# Patient Record
Sex: Male | Born: 1960 | Race: White | Hispanic: No | Marital: Single | Smoking: Former smoker
Health system: Southern US, Community
[De-identification: ages and names within clinical notes are randomized; demographics above are authoritative.]

## PROBLEM LIST (undated history)

## (undated) DIAGNOSIS — Z7189 Other specified counseling: Secondary | ICD-10-CM

## (undated) DIAGNOSIS — C3491 Malignant neoplasm of unspecified part of right bronchus or lung: Secondary | ICD-10-CM

## (undated) DIAGNOSIS — Z5111 Encounter for antineoplastic chemotherapy: Secondary | ICD-10-CM

## (undated) DIAGNOSIS — R000 Tachycardia, unspecified: Secondary | ICD-10-CM

## (undated) DIAGNOSIS — J90 Pleural effusion, not elsewhere classified: Secondary | ICD-10-CM

## (undated) DIAGNOSIS — R0600 Dyspnea, unspecified: Secondary | ICD-10-CM

## (undated) DIAGNOSIS — F419 Anxiety disorder, unspecified: Secondary | ICD-10-CM

## (undated) HISTORY — DX: Other specified counseling: Z71.89

## (undated) HISTORY — PX: WISDOM TOOTH EXTRACTION: SHX21

## (undated) HISTORY — DX: Malignant neoplasm of unspecified part of right bronchus or lung: C34.91

## (undated) HISTORY — DX: Encounter for antineoplastic chemotherapy: Z51.11

---

## 2016-01-25 ENCOUNTER — Observation Stay (HOSPITAL_COMMUNITY): Payer: Medicare HMO

## 2016-01-25 ENCOUNTER — Encounter (HOSPITAL_COMMUNITY): Payer: Self-pay

## 2016-01-25 ENCOUNTER — Inpatient Hospital Stay (HOSPITAL_COMMUNITY)
Admission: EM | Admit: 2016-01-25 | Discharge: 2016-02-05 | DRG: 166 | Disposition: A | Discharge status: Home Health | Payer: Medicare HMO | Attending: Internal Medicine | Admitting: Internal Medicine

## 2016-01-25 ENCOUNTER — Emergency Department (HOSPITAL_COMMUNITY): Payer: Medicare HMO

## 2016-01-25 DIAGNOSIS — R7989 Other specified abnormal findings of blood chemistry: Secondary | ICD-10-CM

## 2016-01-25 DIAGNOSIS — J90 Pleural effusion, not elsewhere classified: Secondary | ICD-10-CM

## 2016-01-25 DIAGNOSIS — R0600 Dyspnea, unspecified: Secondary | ICD-10-CM | POA: Diagnosis not present

## 2016-01-25 DIAGNOSIS — K5900 Constipation, unspecified: Secondary | ICD-10-CM | POA: Diagnosis present

## 2016-01-25 DIAGNOSIS — Z87891 Personal history of nicotine dependence: Secondary | ICD-10-CM

## 2016-01-25 DIAGNOSIS — Z9889 Other specified postprocedural states: Secondary | ICD-10-CM

## 2016-01-25 DIAGNOSIS — J910 Malignant pleural effusion: Secondary | ICD-10-CM

## 2016-01-25 DIAGNOSIS — J9811 Atelectasis: Secondary | ICD-10-CM | POA: Diagnosis present

## 2016-01-25 DIAGNOSIS — J939 Pneumothorax, unspecified: Secondary | ICD-10-CM

## 2016-01-25 DIAGNOSIS — Z419 Encounter for procedure for purposes other than remedying health state, unspecified: Secondary | ICD-10-CM

## 2016-01-25 DIAGNOSIS — J9601 Acute respiratory failure with hypoxia: Secondary | ICD-10-CM | POA: Diagnosis present

## 2016-01-25 DIAGNOSIS — R079 Chest pain, unspecified: Secondary | ICD-10-CM

## 2016-01-25 DIAGNOSIS — R0602 Shortness of breath: Secondary | ICD-10-CM | POA: Diagnosis not present

## 2016-01-25 DIAGNOSIS — Z7982 Long term (current) use of aspirin: Secondary | ICD-10-CM

## 2016-01-25 DIAGNOSIS — J948 Other specified pleural conditions: Secondary | ICD-10-CM

## 2016-01-25 DIAGNOSIS — Z9689 Presence of other specified functional implants: Secondary | ICD-10-CM

## 2016-01-25 DIAGNOSIS — N179 Acute kidney failure, unspecified: Secondary | ICD-10-CM | POA: Diagnosis present

## 2016-01-25 DIAGNOSIS — R9431 Abnormal electrocardiogram [ECG] [EKG]: Secondary | ICD-10-CM

## 2016-01-25 DIAGNOSIS — D62 Acute posthemorrhagic anemia: Secondary | ICD-10-CM | POA: Diagnosis not present

## 2016-01-25 DIAGNOSIS — C3491 Malignant neoplasm of unspecified part of right bronchus or lung: Secondary | ICD-10-CM | POA: Diagnosis present

## 2016-01-25 HISTORY — DX: Pleural effusion, not elsewhere classified: J90

## 2016-01-25 HISTORY — PX: THORACENTESIS: SHX235

## 2016-01-25 LAB — CBC
HEMATOCRIT: 40 % (ref 39.0–52.0)
HEMOGLOBIN: 13 g/dL (ref 13.0–17.0)
MCH: 29.7 pg (ref 26.0–34.0)
MCHC: 32.5 g/dL (ref 30.0–36.0)
MCV: 91.5 fL (ref 78.0–100.0)
Platelets: 475 10*3/uL — ABNORMAL HIGH (ref 150–400)
RBC: 4.37 MIL/uL (ref 4.22–5.81)
RDW: 13.7 % (ref 11.5–15.5)
WBC: 14.4 10*3/uL — AB (ref 4.0–10.5)

## 2016-01-25 LAB — COMPREHENSIVE METABOLIC PANEL
ALT: 35 U/L (ref 17–63)
AST: 34 U/L (ref 15–41)
Albumin: 3.1 g/dL — ABNORMAL LOW (ref 3.5–5.0)
Alkaline Phosphatase: 88 U/L (ref 38–126)
Anion gap: 15 (ref 5–15)
BUN: 19 mg/dL (ref 6–20)
CHLORIDE: 94 mmol/L — AB (ref 101–111)
CO2: 26 mmol/L (ref 22–32)
Calcium: 9.4 mg/dL (ref 8.9–10.3)
Creatinine, Ser: 1.32 mg/dL — ABNORMAL HIGH (ref 0.61–1.24)
GFR, EST NON AFRICAN AMERICAN: 59 mL/min — AB (ref >60)
Glucose, Bld: 131 mg/dL — ABNORMAL HIGH (ref 65–99)
POTASSIUM: 4.8 mmol/L (ref 3.5–5.1)
Sodium: 135 mmol/L (ref 135–145)
Total Bilirubin: 0.9 mg/dL (ref 0.3–1.2)
Total Protein: 7.7 g/dL (ref 6.5–8.1)

## 2016-01-25 LAB — PROTEIN, BODY FLUID: TOTAL PROTEIN, FLUID: 4.7 g/dL

## 2016-01-25 LAB — I-STAT TROPONIN, ED: Troponin i, poc: 0 ng/mL (ref 0.00–0.08)

## 2016-01-25 LAB — BODY FLUID CELL COUNT WITH DIFFERENTIAL
EOS FL: 1 %
Lymphs, Fluid: 50 %
Monocyte-Macrophage-Serous Fluid: 3 % — ABNORMAL LOW (ref 50–90)
Neutrophil Count, Fluid: 46 % — ABNORMAL HIGH (ref 0–25)
Total Nucleated Cell Count, Fluid: 1898 cu mm — ABNORMAL HIGH (ref 0–1000)

## 2016-01-25 LAB — LACTATE DEHYDROGENASE, PLEURAL OR PERITONEAL FLUID: LD FL: 674 U/L — AB (ref 3–23)

## 2016-01-25 LAB — PROTIME-INR
INR: 1.52
PROTHROMBIN TIME: 18.5 s — AB (ref 11.4–15.2)

## 2016-01-25 LAB — MRSA PCR SCREENING: MRSA by PCR: NEGATIVE

## 2016-01-25 LAB — GRAM STAIN

## 2016-01-25 LAB — D-DIMER, QUANTITATIVE (NOT AT ARMC): D DIMER QUANT: 4.04 ug{FEU}/mL — AB (ref 0.00–0.50)

## 2016-01-25 LAB — LACTATE DEHYDROGENASE: LDH: 175 U/L (ref 98–192)

## 2016-01-25 LAB — TROPONIN I
Troponin I: <0.03 ng/mL (ref <0.03)
Troponin I: <0.03 ng/mL (ref <0.03)

## 2016-01-25 LAB — LIPASE, BLOOD: LIPASE: 51 U/L (ref 11–51)

## 2016-01-25 MED ORDER — HYDROCODONE-ACETAMINOPHEN 5-325 MG PO TABS
1.0000 | ORAL_TABLET | ORAL | Status: DC | PRN
  Administered 2016-02-03: 2 via ORAL
  Administered 2016-02-04: 1 via ORAL
  Administered 2016-02-04: 2 via ORAL
  Administered 2016-02-04 – 2016-02-05 (×2): 1 via ORAL
  Filled 2016-01-25 (×3): qty 1
  Filled 2016-01-25: qty 2
  Filled 2016-01-25: qty 1
  Filled 2016-01-25: qty 2

## 2016-01-25 MED ORDER — SODIUM CHLORIDE 0.9 % IV SOLN: INTRAVENOUS | Status: DC

## 2016-01-25 MED ORDER — ACETAMINOPHEN 650 MG RE SUPP: 650.0000 mg | Freq: Four times a day (QID) | RECTAL | Status: DC | PRN

## 2016-01-25 MED ORDER — LIDOCAINE HCL 1 % IJ SOLN
INTRAMUSCULAR | Status: AC
  Filled 2016-01-25: qty 20

## 2016-01-25 MED ORDER — BISACODYL 5 MG PO TBEC: 5.0000 mg | DELAYED_RELEASE_TABLET | Freq: Every day | ORAL | Status: DC | PRN

## 2016-01-25 MED ORDER — SODIUM CHLORIDE 0.9 % IV SOLN
INTRAVENOUS | Status: DC
  Administered 2016-01-25 – 2016-01-26 (×2): via INTRAVENOUS

## 2016-01-25 MED ORDER — ACETAMINOPHEN 325 MG PO TABS: 650.0000 mg | ORAL_TABLET | Freq: Four times a day (QID) | ORAL | Status: DC | PRN

## 2016-01-25 MED ORDER — POLYETHYLENE GLYCOL 3350 17 G PO PACK: 17.0000 g | PACK | Freq: Every day | ORAL | Status: DC | PRN

## 2016-01-25 MED ORDER — IOPAMIDOL (ISOVUE-370) INJECTION 76%
INTRAVENOUS | Status: AC
  Administered 2016-01-25: 100 mL
  Filled 2016-01-25: qty 100

## 2016-01-25 MED ORDER — ONDANSETRON HCL 4 MG/2ML IJ SOLN: 4.0000 mg | Freq: Four times a day (QID) | INTRAMUSCULAR | Status: DC | PRN

## 2016-01-25 MED ORDER — ONDANSETRON HCL 4 MG PO TABS: 4.0000 mg | ORAL_TABLET | Freq: Four times a day (QID) | ORAL | Status: DC | PRN

## 2016-01-25 MED ORDER — PIPERACILLIN-TAZOBACTAM 3.375 G IVPB
3.3750 g | Freq: Three times a day (TID) | INTRAVENOUS | Status: DC
  Administered 2016-01-26 – 2016-01-27 (×5): 3.375 g via INTRAVENOUS
  Filled 2016-01-25 (×7): qty 50

## 2016-01-25 MED ORDER — SODIUM CHLORIDE 0.9% FLUSH
3.0000 mL | Freq: Two times a day (BID) | INTRAVENOUS | Status: DC
Start: 2016-01-25 — End: 2016-02-05
  Administered 2016-01-28 – 2016-02-04 (×11): 3 mL via INTRAVENOUS

## 2016-01-25 NOTE — Progress Notes (Signed)
Pharmacy Antibiotic Note  Imanuel Barnes is a 55 y.o. male admitted on 01/25/2016 with shortness of breath. Found to have large right-sided pleural effusion.  Chest CT negative for PE.  Pharmacy has been consulted for Zosyn dosing for parapneumonic effusion.  Afebrile, WBC 14.4, creatinine 1.32.     s/p right thoracentesis, 2 liters.  Fluid sent for culture and analysis.  Plan: Zosyn 3.375g IV q8h (4 hour infusion).  Follow renal function, culture data, studies and progress.  Height: '5\' 10"'$  (177.8 cm) Weight: 182 lb (82.6 kg) IBW/kg (Calculated) : 73  Temp (24hrs), Avg:97.6 F (36.4 C), Min:97.6 F (36.4 C), Max:97.6 F (36.4 C)   Recent Labs Lab 01/25/16 0755  WBC 14.4*  CREATININE 1.32*    Estimated Creatinine Clearance: 64.5 mL/min (by C-G formula based on SCr of 1.32 mg/dL).    No Known Allergies  Antimicrobials this admission:   Zosyn 8/25>>  Dose adjustments this admission:   n/a  Microbiology results:  8/25 pleural fluid -   8/25 MRSA PCR negative  Roger Barnes 01/25/2016 5:19 PM

## 2016-01-25 NOTE — Care Management Note (Signed)
Case Management Note  Patient Details  Name: Marek Nghiem MRN: [id number] Date of Birth: Apr 18, 1961  Subjective/Objective:                  55 yo male presents with dyspnea, tachypnea, tachycardia. Rule out malignancy ( lung,  lymphoma, etc..).Marland Kitchen Rule out PE.// From home with father  Action/Plan: Follow for disposition needs. /Anticipate PCP establishment and medication assistance.   Expected Discharge Date:  01/27/16               Expected Discharge Plan:  Home/Self Care  In-House Referral:  PCP / [facility]  Discharge planning Services  CM Consult, Medication Assistance  Post Acute Care Choice:    Choice offered to:     DME Arranged:    DME Agency:     HH Arranged:    HH Agency:     Status of Service:  In process, will continue to follow  If discussed at Long Length of Stay Meetings, dates discussed:    Additional Comments: Pt reports taking care of his Dad who is a cancer.  Fuller Mandril, RN 01/25/2016, 11:45 AM

## 2016-01-25 NOTE — ED Notes (Signed)
Dr.  Cathleen Fears is aware of pt.s elevated HR

## 2016-01-25 NOTE — Consult Note (Signed)
Name: Roger Barnes MRN: [id number] DOB: 11/12/1960    ADMISSION DATE:  01/25/2016 CONSULTATION DATE:  01/25/16  REFERRING MD :  Allyson Sabal / [facility]   CHIEF COMPLAINT:   Pleural effusion   BRIEF PATIENT DESCRIPTION:  This is a 55 year old male w/ no significant medical history. 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 He presents to the ER on 8/25 w/ about 2 week h/o progressive shortness of breath, intermittent sharp chest pain and scapular pain, poor appetite, possible wt lose. In ER CXR showed complete opacification of the right side w/ shift of mediastinum to the left which would favor effusion. PCCM asked to evaluate.    SIGNIFICANT EVENTS   STUDIES:  CT chest 8/25>>>   PAST MEDICAL HISTORY :  No past medical problems.  Prior to Admission medications   Medication Sig Start Date End Date Taking? Authorizing Provider  aspirin EC 81 MG tablet Take 81 mg by mouth daily.   Yes Historical Provider, MD  Multiple Vitamins-Minerals (MULTIVITAMIN WITH MINERALS) tablet Take 1 tablet by mouth daily.   Yes Historical Provider, MD   No Known Allergies  FAMILY HISTORY:  family history includes COPD in his maternal grandmother; Cancer in his father. His father has extensive head and neck cancer  SOCIAL HISTORY:  reports that he has quit smoking. He has never used smokeless tobacco. He reports that he does not drink alcohol or use drugs.  REVIEW OF SYSTEMS:   Constitutional: Negative for fever, chills, weight loss, malaise/fatigue and diaphoresis.  HENT: Negative for hearing loss, ear pain, nosebleeds, congestion, sore throat, neck pain, tinnitus and ear discharge.   Eyes: Negative for blurred vision, double vision, photophobia, pain, discharge and redness.  Respiratory: Negative for cough, hemoptysis, sputum production, shortness of breath, wheezing and stridor, scapular and back pain Cardiovascular: Negative for chest pain, palpitations, orthopnea,  claudication, leg swelling and PND.  Gastrointestinal: Negative for heartburn, nausea, vomiting, abdominal pain, diarrhea, constipation, blood in stool and melena. poor po appetite  Genitourinary: Negative for dysuria, urgency, frequency, hematuria and flank pain.  Musculoskeletal: Negative for myalgias, back pain, joint pain and falls.  Skin: Negative for itching and rash.  Neurological: Negative for dizziness, tingling, tremors, sensory change, speech change, focal weakness, seizures, loss of consciousness, weakness and headaches.  Endo/Heme/Allergies: Negative for environmental allergies and polydipsia. Does not bruise/bleed easily.  SUBJECTIVE:  No distress  VITAL SIGNS: Temp:  [97.6 F (36.4 C)] 97.6 F (36.4 C) (08/25 0731) Pulse Rate:  [117-127] 117 (08/25 1130) Resp:  [18-44] 26 (08/25 1130) BP: (112-138)/(86-102) 117/96 (08/25 1130) SpO2:  [96 %-100 %] 100 % (08/25 1130) Weight:  [182 lb (82.6 kg)] 182 lb (82.6 kg) (08/25 0732)  PHYSICAL EXAMINATION: General:  55 year old white male, no distress.  Neuro:  Awake, oriented, no focal def  HEENT:  NCAT, MMM, no JVD  Cardiovascular:  RRR; no MRG Lungs:  Decreased on right; clear on left  Abdomen:  Soft, not tender. + bowel sounds  Musculoskeletal:  Equal st and bulk  Skin:  Warm and dry    Recent Labs Lab 01/25/16 0755  NA 135  K 4.8  CL 94*  CO2 26  BUN 19  CREATININE 1.32*  GLUCOSE 131*    Recent Labs Lab 01/25/16 0755  HGB 13.0  HCT 40.0  WBC 14.4*  PLT 475*   Dg Chest Port 1 View  Result Date: 01/25/2016 CLINICAL DATA:  Shortness of  breath. EXAM: PORTABLE CHEST 1 VIEW COMPARISON:  No prior . FINDINGS: Complete opacification of the right hemi thorax with shift of the mediastinum and heart to the left noted. Findings consist with large right pleural effusion. Underlying pulmonary disease cannot be excluded. Left lung is clear. No pneumothorax . IMPRESSION: Complete opacification of the right hemi thorax with  shift of the mediastinum and heart to the left. Findings consist with large right pleural effusion. Underlying right lung disease cannot be excluded . Electronically Signed   By: Marcello Moores  Register   On: 01/25/2016 09:41    ASSESSMENT / PLAN:  Large right sided Pleural effusion. Etiology unclear. Certainly malignancy on d-dx.  Plan Therapeutic and diagnostic thoracentesis Following this would get CT chest to better evaluate for chest pathology.   Erick Colace ACNP-BC [place] Pager # [phone] OR # [phone] if no answer  01/25/2016, 12:13 PM  PCCM Attending Note: Patient seen and examined with nurse practitioner. Please refer to his consultation note which I have reviewed in detail. 55 year old male with progressively worsening dyspnea. Denies any chest pain or pressure. Now status post thoracentesis with removal of 2 L of fluid. Patient denies any lymphadenopathy in his neck, groin, or axilla. He denies any headache, vision changes, or focal weakness, numbness, or tingling. Does report some early satiety but denies any frank dysphagia or odynophagia.  BP 120/89   Pulse 97   Temp 97.6 F (36.4 C) (Oral)   Resp 19   Ht '5\' 10"'$  (1.778 m)   Wt 182 lb (82.6 kg)   SpO2 98%   BMI 26.11 kg/m  General:  Awake. Alert. No acute distress. Thin, Caucasian male.  Integument:  Warm & dry. No rash on exposed skin. No bruising. Lymphatics: No appreciated cervical or supraclavicular lymphadenopathy. HEENT:  Moist mucus membranes. No oral ulcers. No scleral injection or icterus.  Cardiovascular:  Regular rate. No edema. No appreciable JVD.  Pulmonary:  Decreased breath sounds in the right lung base. Dullness to percussion over right lung base. Normal work of breathing on nasal cannula oxygen. Abdomen: Soft. Normal bowel sounds. Nondistended. Grossly nontender. Musculoskeletal:  Normal bulk and tone. Hand grip strength 5/5 bilaterally. No joint deformity or effusion  appreciated.  A/P:  55 y.o. male with large right pleural effusion. Now status post thoracentesis. Findings highly suspicious for a malignant process given absence of infectious symptoms. I did discuss this with the patient. Reviewed patient's chest x-ray which does show deviation of the trachea to the left likely from the size of his right pleural effusion.  1. Right pleural effusion: Awaiting pleural fluid analysis and cytology. 2. Acute hypoxic respiratory failure likely secondary to right pleural effusion. Recommend continue to wean FiO2 for saturations greater than 92%.  Sonia Baller Ashok Cordia, M.D. [facility] Pager:  [phone] After 3pm or if no response, call [phone] 6:02 PM 01/25/16

## 2016-01-25 NOTE — H&P (Signed)
History and Physical    Roger Barnes YWV:[id number] DOB: Jun 01, 1961 DOA: 01/25/2016  PCP: No primary care provider on file.  Patient coming from:   Home   Chief Complaint: shortness of breath and abdominal pain  HPI: Roger Barnes is a 55 y.o. male with no significant PMH. Patient has not seen a healthcare provider in many years.. He takes no over-the-counter or prescription medications. Approximately 2 weeks ago patient began to get short of breath with just minimal exertion.. He has intermittent sharp pain in right scapula. No chest pain. Patient does complain of upper abdominal discomfort described as a bloated sensation. No nausea or vomiting but appetite has been suboptimal. Patient thinks he is also 5-10 pounds over the last couple of weeks. No cough . No fevers or night sweats . Chest x-ray shows large right pleural effusion. Patient has a history of smoking approximately 10 years ago.   ED Course:  Afebrile, heart rate up to 127. Respirations upper twenties to thirties. Oxygen saturation normal on room air D-dimer 4.04,  creatinine 1.32 with normal BUN   Review of Systems: As per HPI, otherwise 10 point review of systems negative.   History reviewed. No pertinent past medical history.  History reviewed. No pertinent surgical history. No surgeries  Social History   Social History  . Marital status: Single    Spouse name: N/A  . Number of children: N/A  . Years of education: N/A   Occupational History  . Not on file.   Social History Main Topics  . Smoking status: Former Research scientist (life sciences)  . Smokeless tobacco: Never Used  . Alcohol use No  . Drug use: No  . Sexual activity: Not on file   Other Topics Concern  . Not on file   Social History Narrative  . No narrative on file   Patient lives at home with his parents. He is the caretaker of both parents. Walks without any assistive devices. No history of alcohol use No Known Allergies  Family History    Problem Relation Age of Onset  . Cancer Father     oral  . COPD Maternal Grandmother     Prior to Admission medications   Medication Sig Start Date End Date Taking? Authorizing Provider  aspirin EC 81 MG tablet Take 81 mg by mouth daily.   Yes Historical Provider, MD  Multiple Vitamins-Minerals (MULTIVITAMIN WITH MINERALS) tablet Take 1 tablet by mouth daily.   Yes Historical Provider, MD    Physical Exam: Vitals:   01/25/16 0830 01/25/16 0900 01/25/16 0930 01/25/16 1000  BP: 122/93 115/89 112/92 120/88  Pulse: (!) 123 118 118 117  Resp: (!) 44 (!) 33 (!) 32 (!) 33  Temp:      TempSrc:      SpO2: 98% 97% 96% 97%  Weight:      Height:        Constitutional:  Pleasant, thin white male in NAD, calm, comfortable Vitals:   01/25/16 0830 01/25/16 0900 01/25/16 0930 01/25/16 1000  BP: 122/93 115/89 112/92 120/88  Pulse: (!) 123 118 118 117  Resp: (!) 44 (!) 33 (!) 32 (!) 33  Temp:      TempSrc:      SpO2: 98% 97% 96% 97%  Weight:      Height:       Eyes: PER, lids and conjunctivae normal ENMT: Mucous membranes are moist. Posterior pharynx clear of any exudate or lesions..  Neck: normal, supple, no masses Respiratory: Diminished breath  sounds of right lung . Left lung clear to auscultation. Normal respiratory effort. No accessory muscle use.  Cardiovascular:  sinus tachycardia. No extremity edema. 2+ dorsal pedis pulses.   Abdomen: upper abdomen minimally tender though there is large area of fullness in mid upper abdomen. Bowel sounds positive.  Musculoskeletal: no clubbing / cyanosis. No joint deformity upper and lower extremities. Good ROM, no contractures. Normal muscle tone.  Skin: no rashes, lesions, ulcers.  Neurologic: CN 2-12 grossly intact. Sensation intact, Strength 5/5 in all 4.  Psychiatric: Normal judgment and insight. Alert and oriented x 3. Normal mood.    Labs on Admission: I have personally reviewed following labs and imaging studies   Radiological Exams  on Admission: Dg Chest Port 1 View  Result Date: 01/25/2016 CLINICAL DATA:  Shortness of breath. EXAM: PORTABLE CHEST 1 VIEW COMPARISON:  No prior . FINDINGS: Complete opacification of the right hemi thorax with shift of the mediastinum and heart to the left noted. Findings consist with large right pleural effusion. Underlying pulmonary disease cannot be excluded. Left lung is clear. No pneumothorax . IMPRESSION: Complete opacification of the right hemi thorax with shift of the mediastinum and heart to the left. Findings consist with large right pleural effusion. Underlying right lung disease cannot be excluded . Electronically Signed   By: Marcello Moores  Register   On: 01/25/2016 09:41    EKG: Independently reviewed.   EKG Interpretation  Date/Time:  Friday January 25 2016 07:28:20 EDT Ventricular Rate:  128 PR Interval:  162 QRS Duration: 80 QT Interval:  304 QTC Calculation: 443 R Axis:   124 Text Interpretation:  Sinus tachycardia Possible Right ventricular hypertrophy Septal infarct , age undetermined Lateral infarct , age undetermined T wave abnormality, consider inferior ischemia Abnormal ECG No old tracing to compare Confirmed by Winfred Leeds  MD, SAM [id number]) on 01/25/2016 8:15:03 AM       Assessment/Plan   Active Problems:   Pleural effusion, right   Pleural effusion   Elevated d-dimer   Abnormal EKG      Large right pleural.  Presents with dyspnea, tachypnea, tachycardia. Rule out malignancy ( lung,  lymphoma, etc..).Marland Kitchen Rule out PE especially with elevated ddimer.       -place in observation -stepdown -Pulmonary Medicine consulted -Thoracentesis with fluid studies -check coags -CT abd / pelvis with contrast to look for malignancies ( upper abdominal fullness appreciated on exam) -02 per [place]  Abnormal EKG - septal and lateral infarcts of undetermined age.  -serial troponins -echocardiogram  Mild AKI. No baseline labs. Normal BUN, Cr 1.32. GFR nearly normal at 59.  -continue  gentle IV hydration.  -am bMET   DVT prophylaxis:   SCDs until after thoracentesis Code Status:     Full code  Family Communication:   none..  Disposition Plan:   Discharge  In 24-48 hours            Consults called:  Pulmonary Consult - Dr. Tera Partridge  Admission status:   Observation- stepdown  Tye Savoy NP [facility] Pager [phone]  If 7PM-7AM, please contact night-coverage [email or web address] Password [facility]  01/25/2016, 10:56 AM

## 2016-01-25 NOTE — ED Provider Notes (Signed)
[facility] DEPT Provider Note   CSN: [id number] Arrival date & time: 01/25/16  4332     History   Chief Complaint Chief Complaint  Patient presents with  . Abdominal Pain    HPI Roger Barnes is a 55 y.o. male.Complains of vague upper abdominal discomfort for the past 2 weeks. Worse with certain positions and improved with sitting in other positions. He also feels as if he can't get a deep breath and has dyspnea on exertion to the point where walking around across the room makes and dyspneic and feels generally weak. He denies fever. No treatment prior to coming here he's never had similar symptoms before. No other associated symptoms. Last bowel movement yesterday, normal.  HPI  History reviewed. No pertinent past medical history.  There are no active problems to display for this patient.   History reviewed. No pertinent surgical history.     Home Medications    Prior to Admission medications   Not on File    Family History No family history on file.  Social History Social History  Substance Use Topics  . Smoking status: Never Smoker  . Smokeless tobacco: Never Used  . Alcohol use No     Allergies   Review of patient's allergies indicates no known allergies.   Review of Systems Review of Systems  HENT: Negative.   Respiratory: Positive for shortness of breath.   Cardiovascular: Negative.   Gastrointestinal: Positive for abdominal pain.       Vague abdominal discomfort  Musculoskeletal: Negative.   Skin: Negative.   Neurological: Positive for weakness.       Generalized weakness  Psychiatric/Behavioral: Negative.   All other systems reviewed and are negative.    Physical Exam Updated Vital Signs BP 126/86 (BP Location: Right Arm)   Pulse (!) 126   Temp 97.6 F (36.4 C) (Oral)   Resp (!) 28   Ht '5\' 10"'$  (1.778 m)   Wt 182 lb (82.6 kg)   SpO2 99%   BMI 26.11 kg/m   Physical Exam  Constitutional: He appears well-developed and  well-nourished.  HENT:  Head: Normocephalic and atraumatic.  Eyes: Conjunctivae are normal. Pupils are equal, round, and reactive to light.  Neck: Neck supple. No tracheal deviation present. No thyromegaly present.  Cardiovascular: Regular rhythm and intact distal pulses.   No murmur heard. Tachycardic  Pulmonary/Chest: Effort normal and breath sounds normal.  Abdominal: Soft. Bowel sounds are normal. He exhibits no distension. There is no tenderness.  Musculoskeletal: Normal range of motion. He exhibits no edema or tenderness.  Neurological: He is alert. Coordination normal.  Skin: Skin is warm and dry. No rash noted.  Psychiatric: He has a normal mood and affect.  Nursing note and vitals reviewed.    ED Treatments / Results  Labs (all labs ordered are listed, but only abnormal results are displayed) Labs Reviewed  CBC - Abnormal; Notable for the following:       Result Value   WBC 14.4 (*)    Platelets 475 (*)    All other components within normal limits  LIPASE, BLOOD  COMPREHENSIVE METABOLIC PANEL  D-DIMER, QUANTITATIVE (NOT AT [facility])  I-STAT TROPOININ, ED    EKG  EKG Interpretation  Date/Time:  Friday January 25 2016 07:28:20 EDT Ventricular Rate:  128 PR Interval:  162 QRS Duration: 80 QT Interval:  304 QTC Calculation: 443 R Axis:   124 Text Interpretation:  Sinus tachycardia Possible Right ventricular hypertrophy Septal infarct , age undetermined  Lateral infarct , age undetermined T wave abnormality, consider inferior ischemia Abnormal ECG No old tracing to compare Confirmed by Winfred Leeds  MD, Amere Bricco [id number]) on 01/25/2016 8:15:03 AM       Radiology No results found.  Procedures Procedures (including critical care time)  Medications Ordered in ED Medications - No data to display Results for orders placed or performed during the hospital encounter of 01/25/16  Lipase, blood  Result Value Ref Range   Lipase 51 11 - 51 U/L  Comprehensive metabolic panel    Result Value Ref Range   Sodium 135 135 - 145 mmol/L   Potassium 4.8 3.5 - 5.1 mmol/L   Chloride 94 (L) 101 - 111 mmol/L   CO2 26 22 - 32 mmol/L   Glucose, Bld 131 (H) 65 - 99 mg/dL   BUN 19 6 - 20 mg/dL   Creatinine, Ser 1.32 (H) 0.61 - 1.24 mg/dL   Calcium 9.4 8.9 - 10.3 mg/dL   Total Protein 7.7 6.5 - 8.1 g/dL   Albumin 3.1 (L) 3.5 - 5.0 g/dL   AST 34 15 - 41 U/L   ALT 35 17 - 63 U/L   Alkaline Phosphatase 88 38 - 126 U/L   Total Bilirubin 0.9 0.3 - 1.2 mg/dL   GFR calc non Af Amer 59 (L) >60 mL/min   GFR calc Af Amer >60 >60 mL/min   Anion gap 15 5 - 15  CBC  Result Value Ref Range   WBC 14.4 (H) 4.0 - 10.5 K/uL   RBC 4.37 4.22 - 5.81 MIL/uL   Hemoglobin 13.0 13.0 - 17.0 g/dL   HCT 40.0 39.0 - 52.0 %   MCV 91.5 78.0 - 100.0 fL   MCH 29.7 26.0 - 34.0 pg   MCHC 32.5 30.0 - 36.0 g/dL   RDW 13.7 11.5 - 15.5 %   Platelets 475 (H) 150 - 400 K/uL  D-dimer, quantitative (not at [facility])  Result Value Ref Range   D-Dimer, Quant 4.04 (H) 0.00 - 0.50 ug/mL-FEU  I-stat troponin, ED  Result Value Ref Range   Troponin i, poc 0.00 0.00 - 0.08 ng/mL   Comment 3           Dg Chest Port 1 View  Result Date: 01/25/2016 CLINICAL DATA:  Shortness of breath. EXAM: PORTABLE CHEST 1 VIEW COMPARISON:  No prior . FINDINGS: Complete opacification of the right hemi thorax with shift of the mediastinum and heart to the left noted. Findings consist with large right pleural effusion. Underlying pulmonary disease cannot be excluded. Left lung is clear. No pneumothorax . IMPRESSION: Complete opacification of the right hemi thorax with shift of the mediastinum and heart to the left. Findings consist with large right pleural effusion. Underlying right lung disease cannot be excluded . Electronically Signed   By: Marcello Moores  Register   On: 01/25/2016 09:41    Initial Impression / Assessment and Plan / ED Course  I have reviewed the triage vital signs and the nursing notes.  Pertinent labs & imaging results  that were available during my care of the patient were reviewed by me and considered in my medical decision making (see chart for details).  Clinical Course     Chest x-ray viewed by me. Hospitalist service consulted. He will see patient in ED. Spoke with Earnest Bailey, NP who will evaluate patient for admission. In light of elevated d-dimer patient likely need CT Angela chest which will also define underlying lung pathology, however my pretest clinical suspicion  of pulmonary embolism is low Final Clinical Impressions(s) / ED Diagnoses  Diagnosis #1 right pleural effusion #2 dyspnea on exertion #3 renal insufficiency Final diagnoses:  None    New Prescriptions New Prescriptions   No medications on file     Orlie Dakin, MD 01/25/16 1018

## 2016-01-25 NOTE — ED Triage Notes (Signed)
Per PT, Pt is coming from home with complaints of Abdominal pain, bloating, and SOB that started two weeks ago. Pt reports taking care of his Dad who is a cancer patient and has had a significant cough for the last couple weeks. Pt complains of having some neck pain and "full feeling" when he is eating. Pt reports small regular bowel movement with decrease appetite.

## 2016-01-25 NOTE — ED Notes (Signed)
Attempted to obtain urine specimen; Pt unable to provide one at this time 

## 2016-01-25 NOTE — ED Notes (Signed)
Patient transported to Ultrasound, stable upon transfer

## 2016-01-25 NOTE — Procedures (Signed)
Ultrasound-guided diagnostic and therapeutic right thoracentesis performed yielding 2 liters of serosangenious colored fluid. Stopped after 2L for a first time thoracentesis, but several liters of fluid still likely remain. No immediate complications. Follow-up chest x-ray pending.      Kalon Erhardt E 12:48 PM 01/25/2016

## 2016-01-25 NOTE — ED Notes (Signed)
Pt. Returned from [place] and Ct scans.  Pt. Is stable, placed on the monitor.  No sob noted, no resp. Distress. Noted.

## 2016-01-26 ENCOUNTER — Observation Stay (HOSPITAL_BASED_OUTPATIENT_CLINIC_OR_DEPARTMENT_OTHER): Payer: Medicare HMO

## 2016-01-26 DIAGNOSIS — D62 Acute posthemorrhagic anemia: Secondary | ICD-10-CM | POA: Diagnosis not present

## 2016-01-26 DIAGNOSIS — R0600 Dyspnea, unspecified: Secondary | ICD-10-CM

## 2016-01-26 DIAGNOSIS — R931 Abnormal findings on diagnostic imaging of heart and coronary circulation: Secondary | ICD-10-CM | POA: Diagnosis not present

## 2016-01-26 DIAGNOSIS — J9601 Acute respiratory failure with hypoxia: Secondary | ICD-10-CM

## 2016-01-26 DIAGNOSIS — C3491 Malignant neoplasm of unspecified part of right bronchus or lung: Secondary | ICD-10-CM | POA: Diagnosis present

## 2016-01-26 DIAGNOSIS — Z7982 Long term (current) use of aspirin: Secondary | ICD-10-CM | POA: Diagnosis not present

## 2016-01-26 DIAGNOSIS — R0602 Shortness of breath: Secondary | ICD-10-CM | POA: Diagnosis not present

## 2016-01-26 DIAGNOSIS — Z87891 Personal history of nicotine dependence: Secondary | ICD-10-CM | POA: Diagnosis not present

## 2016-01-26 DIAGNOSIS — D72829 Elevated white blood cell count, unspecified: Secondary | ICD-10-CM | POA: Diagnosis not present

## 2016-01-26 DIAGNOSIS — C3490 Malignant neoplasm of unspecified part of unspecified bronchus or lung: Secondary | ICD-10-CM | POA: Diagnosis not present

## 2016-01-26 DIAGNOSIS — J948 Other specified pleural conditions: Secondary | ICD-10-CM | POA: Diagnosis not present

## 2016-01-26 DIAGNOSIS — N179 Acute kidney failure, unspecified: Secondary | ICD-10-CM

## 2016-01-26 DIAGNOSIS — J9811 Atelectasis: Secondary | ICD-10-CM | POA: Diagnosis present

## 2016-01-26 DIAGNOSIS — J90 Pleural effusion, not elsewhere classified: Secondary | ICD-10-CM

## 2016-01-26 DIAGNOSIS — J910 Malignant pleural effusion: Secondary | ICD-10-CM | POA: Diagnosis present

## 2016-01-26 DIAGNOSIS — R9431 Abnormal electrocardiogram [ECG] [EKG]: Secondary | ICD-10-CM | POA: Diagnosis present

## 2016-01-26 DIAGNOSIS — K5900 Constipation, unspecified: Secondary | ICD-10-CM | POA: Diagnosis present

## 2016-01-26 LAB — BASIC METABOLIC PANEL
Anion gap: 9 (ref 5–15)
BUN: 19 mg/dL (ref 6–20)
CO2: 28 mmol/L (ref 22–32)
CREATININE: 1.08 mg/dL (ref 0.61–1.24)
Calcium: 8.2 mg/dL — ABNORMAL LOW (ref 8.9–10.3)
Chloride: 101 mmol/L (ref 101–111)
GFR calc Af Amer: >60 mL/min (ref >60)
GLUCOSE: 96 mg/dL (ref 65–99)
POTASSIUM: 3.9 mmol/L (ref 3.5–5.1)
Sodium: 138 mmol/L (ref 135–145)

## 2016-01-26 LAB — TROPONIN I: Troponin I: <0.03 ng/mL

## 2016-01-26 LAB — CBC
HEMATOCRIT: 33.9 % — AB (ref 39.0–52.0)
Hemoglobin: 10.8 g/dL — ABNORMAL LOW (ref 13.0–17.0)
MCH: 29.2 pg (ref 26.0–34.0)
MCHC: 31.9 g/dL (ref 30.0–36.0)
MCV: 91.6 fL (ref 78.0–100.0)
PLATELETS: 343 10*3/uL (ref 150–400)
RBC: 3.7 MIL/uL — ABNORMAL LOW (ref 4.22–5.81)
RDW: 14 % (ref 11.5–15.5)
WBC: 8.5 10*3/uL (ref 4.0–10.5)

## 2016-01-26 LAB — ECHOCARDIOGRAM COMPLETE
Height: 70 in
Weight: 2936 [oz_av]

## 2016-01-26 MED ORDER — KETOROLAC TROMETHAMINE 30 MG/ML IJ SOLN: 30.0000 mg | Freq: Four times a day (QID) | INTRAMUSCULAR | Status: AC | PRN

## 2016-01-26 NOTE — Progress Notes (Signed)
PROGRESS NOTE  Roger Barnes ZOX:[id number] DOB: 1960-08-28 DOA: 01/25/2016 PCP: No PCP Per Patient  HPI/Recap of past 24 hours:  Feeling a little better after 2liter of pleural effusion removed yesterday, but continue c/o intermittent sharp chest pain and scapular pain on the right, not able to sleep well last night due to this, Remain sinus tachycardia but improving  Assessment/Plan: Active Problems:   Pleural effusion, right   Pleural effusion   Elevated d-dimer   Abnormal EKG  Sob/large pleural effusion occupying most of the right hemithorax with mediastinum shift and liver shift to the left.  prior smoker, highly concerning for underline malignancy. S/p thoracentesis on 8/25 by IR, cytology pending, Will required multiple therapeutic thoracentesis for symptom relieve. Pulmonology and IR input appreciated.  Leukocytosis: from stress? Does not look septic, on empiric abx  AKI: cr 1.32 on admission, cr normalized with ivf. D/c ivf.    Code Status: full  Family Communication: patient   Disposition Plan: remain in the hospital, likely home early next week   Consultants:  Pulmonology  IR  Procedures:  Thoracentesis 8/25  Antibiotics:  zosyn   Objective: BP 110/72 (BP Location: Left Arm)   Pulse (!) 102   Temp 97.7 F (36.5 C) (Oral)   Resp (!) 24   Ht '5\' 10"'$  (1.778 m)   Wt 83.2 kg (183 lb 8 oz)   SpO2 98%   BMI 26.33 kg/m   Intake/Output Summary (Last 24 hours) at 01/26/16 1351 Last data filed at 01/26/16 0315  Gross per 24 hour  Intake           1342.5 ml  Output              500 ml  Net            842.5 ml   Filed Weights   01/25/16 0732 01/26/16 0500  Weight: 82.6 kg (182 lb) 83.2 kg (183 lb 8 oz)    Exam:   General:  NAD  Cardiovascular: sinus tachycardia  Respiratory: diminished breath sounds on right, clear on left  Abdomen: Soft/ND/NT, positive BS  Musculoskeletal: No Edema  Neuro: aaox3  Data Reviewed: Basic  Metabolic Panel:  Recent Labs Lab 01/25/16 0755 01/26/16 0539  NA 135 138  K 4.8 3.9  CL 94* 101  CO2 26 28  GLUCOSE 131* 96  BUN 19 19  CREATININE 1.32* 1.08  CALCIUM 9.4 8.2*   Liver Function Tests:  Recent Labs Lab 01/25/16 0755  AST 34  ALT 35  ALKPHOS 88  BILITOT 0.9  PROT 7.7  ALBUMIN 3.1*    Recent Labs Lab 01/25/16 0755  LIPASE 51   No results for input(s): AMMONIA in the last 168 hours. CBC:  Recent Labs Lab 01/25/16 0755 01/26/16 0539  WBC 14.4* 8.5  HGB 13.0 10.8*  HCT 40.0 33.9*  MCV 91.5 91.6  PLT 475* 343   Cardiac Enzymes:    Recent Labs Lab 01/25/16 1153 01/25/16 1755 01/25/16 2306  TROPONINI <0.03 <0.03 <0.03   BNP (last 3 results) No results for input(s): BNP in the last 8760 hours.  ProBNP (last 3 results) No results for input(s): PROBNP in the last 8760 hours.  CBG: No results for input(s): GLUCAP in the last 168 hours.  Recent Results (from the past 240 hour(s))  Gram stain     Status: None   Collection Time: 01/25/16 12:44 PM  Result Value Ref Range Status   Specimen Description FLUID PLEURAL  Final  Special Requests NONE  Final   Gram Stain   Final    FEW WBC PRESENT,BOTH PMN AND MONONUCLEAR NO ORGANISMS SEEN    Report Status 01/25/2016 FINAL  Final  MRSA PCR Screening     Status: None   Collection Time: 01/25/16  3:25 PM  Result Value Ref Range Status   MRSA by PCR NEGATIVE NEGATIVE Final    Comment:        The GeneXpert MRSA Assay (FDA approved for NASAL specimens only), is one component of a comprehensive MRSA colonization surveillance program. It is not intended to diagnose MRSA infection nor to guide or monitor treatment for MRSA infections.      Studies: No results found.  Scheduled Meds: . piperacillin-tazobactam (ZOSYN)  IV  3.375 g Intravenous Q8H  . sodium chloride flush  3 mL Intravenous Q12H    Continuous Infusions: . sodium chloride       Time spent: 27mns  Ayden Hardwick MD,  PhD  [facility] Pager [phone] If 7PM-7AM, please contact night-coverage at [email or web address], password [facility]8/26/2017, 1:51 PM  LOS: 0 days

## 2016-01-26 NOTE — Progress Notes (Addendum)
**Note Roger-Identified via Obfuscation** Name: Roger Barnes MRN: [id number] DOB: [birth year]    ADMISSION DATE:  01/25/2016 CONSULTATION DATE:  01/25/16  REFERRING MD :  Allyson Sabal / [facility]   CHIEF COMPLAINT:   Pleural effusion   BRIEF PATIENT DESCRIPTION:  This is a 55 year old male w/ no significant medical history. 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 He presents to the ER on 8/25 w/ about 2 week h/o progressive shortness of breath, intermittent sharp chest pain and scapular pain, poor appetite, possible wt lose. In ER CXR showed complete opacification of the right side w/ shift of mediastinum to the left which would favor effusion. PCCM asked to evaluate.    SIGNIFICANT EVENTS   STUDIES:  CT chest 8/25>>>No evident pulmonary embolus. Ascending thoracic aorta measures 4.0 x 3.9 cm in diameter. Large pleural effusion on the right causing shift of heart and mediastinum as well as liver toward the left. There is extensive compressive atelectasis in the right lung with potential underlying consolidation as well. Only a minimal amount of right lung is aerated. On the left, there are multiple nodular opacities concerning for metastatic foci. Largest of these lesions is in the posterior segment left lower lobe measuring 1.6 x 1.2 cm. There is no left lung edema or consolidation.  Site: right  Date:   Pleural fluid Serum   LDH: 674 LDH:n 175  Protein: 4.7 Protein:  Cholesterol:  Cholesterol:  Afb:   Gm stain: no orgs   Culture:   Fungal:    Hct:    Color: red and cloudy   Wbc: 1898   Neutrophils: 46%   Lymph:    Ph:    Rheumatoid factor:   Adenosine Deaminase:   Glucose:    Cytology:       Special notes:       SUBJECTIVE:  No distress but still w/ right sided tightness VITAL SIGNS: Temp:  [97.7 F (36.5 C)-98.7 F (37.1 C)] 97.7 F (36.5 C) (08/26 0736) Pulse Rate:  [67-102] 102 (08/26 0736) Resp:  [18-31] 24 (08/26 0736) BP: (101-120)/(72-89) 110/72 (08/26  0736) SpO2:  [97 %-100 %] 98 % (08/26 0736) Weight:  [183 lb 8 oz (83.2 kg)] 183 lb 8 oz (83.2 kg) (08/26 0500) 2 liters  PHYSICAL EXAMINATION: General:  55 year old white male, no distress.  Neuro:  Awake, oriented, no focal def  HEENT:  NCAT, MMM, no JVD  Cardiovascular:  RRR; no MRG Lungs:  Decreased on right; clear on left, no accessory use  Abdomen:  Soft, not tender. + bowel sounds  Musculoskeletal:  Equal st and bulk  Skin:  Warm and dry    Recent Labs Lab 01/25/16 0755 01/26/16 0539  NA 135 138  K 4.8 3.9  CL 94* 101  CO2 26 28  BUN 19 19  CREATININE 1.32* 1.08  GLUCOSE 131* 96    Recent Labs Lab 01/25/16 0755 01/26/16 0539  HGB 13.0 10.8*  HCT 40.0 33.9*  WBC 14.4* 8.5  PLT 475* 343   Dg Chest 1 View  Result Date: 01/25/2016 CLINICAL DATA:  Status post thoracentesis with 2 L removed. EXAM: CHEST 1 VIEW COMPARISON:  Chest x-ray from earlier same day. FINDINGS: Despite the thoracentesis, there remains complete opacification the right hemi thorax. There is decreased leftward shift of the heart compatible with the removal of 2 L of fluid from the right chest. Left lung remains clear. IMPRESSION: 1. Persistent complete opacification of the  right hemithorax status post thoracentesis, presumably a large amount of residual effusion. 2. Decreased leftward shift of the heart compatible with the interval removal of 2 L of fluid from the right chest. Electronically Signed   By: Franki Cabot M.D.   On: 01/25/2016 13:01   Ct Angio Chest Pe W Or Wo Contrast  Result Date: 01/25/2016 CLINICAL DATA:  Shortness of Breath EXAM: CT ANGIOGRAPHY CHEST WITH CONTRAST TECHNIQUE: Multidetector CT imaging of the chest was performed using the standard protocol during bolus administration of intravenous contrast. Multiplanar CT image reconstructions and MIPs were obtained to evaluate the vascular anatomy. CONTRAST:  100 mL Isovue-300 nonionic COMPARISON:  Chest radiograph January 25, 2016  FINDINGS: Cardiovascular: No pulmonary embolus is evident. Note that there is extensive compressive atelectasis on the right limiting pulmonary arterial visualization on the right. The ascending thoracic aortic diameter is 4.0 x 3.9 cm. There is no thoracic aortic dissection. The visualized great vessels appear normal. There is no appreciable pericardial effusion. Note that the heart and mediastinum are shifted to the left due to large pleural effusion present. Mediastinum/Nodes: There is no evident adenopathy. Thyroid appears unremarkable except for a single benign-appearing calcification in the superior left lobe region. Lungs/Pleura: There is a large pleural effusion occupying most of the right hemi thorax. This effusion the shifting the mediastinum and heart toward the left. There is compressive atelectasis and consolidation throughout the right lung. There is only a minimal amount of aeration on the right superiorly. On the left, there are multiple cysts nodular opacities scattered throughout the lung. The largest of these nodular opacities is in the posterior segment of the left lower lobe measuring 1.6 x 1.2 cm. Other nodular opacities range in size from as small as 4 mm to as large as 1.3 cm. There is no edema or consolidation on the left. Upper Abdomen: Subpulmonic component to the effusion displaces the liver toward the left. Visualized upper abdominal structures otherwise appear unremarkable. Musculoskeletal: There are no demonstrable blastic or lytic bone lesions. Review of the MIP images confirms the above findings. IMPRESSION: No evident pulmonary embolus. Ascending thoracic aorta measures 4.0 x 3.9 cm in diameter. Recommend annual imaging followup by CTA or MRA. This recommendation follows 2010 ACCF/AHA/AATS/ACR/ASA/SCA/SCAI/SIR/STS/SVM Guidelines for the Diagnosis and Management of Patients with Thoracic Aortic Disease. Circulation. 2010; 121: H631-S970. No thoracic aortic dissection evident. Large  pleural effusion on the right causing shift of heart and mediastinum as well as liver toward the left. There is extensive compressive atelectasis in the right lung with potential underlying consolidation as well. Only a minimal amount of right lung is aerated. On the left, there are multiple nodular opacities concerning for metastatic foci. Largest of these lesions is in the posterior segment left lower lobe measuring 1.6 x 1.2 cm. There is no left lung edema or consolidation. No adenopathy is evident. Electronically Signed   By: Lowella Grip III M.D.   On: 01/25/2016 13:51   Ct Abdomen Pelvis W Contrast  Result Date: 01/25/2016 CLINICAL DATA:  Shortness of breath with abdominal pain and tightness EXAM: CT ABDOMEN AND PELVIS WITH CONTRAST TECHNIQUE: Multidetector CT imaging of the abdomen and pelvis was performed using the standard protocol following bolus administration of intravenous contrast. CONTRAST:  100 mL Omnipaque 300 nonionic COMPARISON:  CT angiogram chest obtained earlier in the day FINDINGS: Lower chest: Nodular lesions are noted in the left lung base, largest measuring 1.6 x 1.3 cm, also seen on CT angiogram chest earlier in the day.  On the right, there is consolidation and compressive atelectasis with large right effusion causing shift of heart and mediastinum toward the left. Hepatobiliary: The liver is displaced toward the left due to the subpulmonic component of the large right pleural effusion. No intrinsic liver lesions are evident. Gallbladder wall is not appreciably thickened. There is no biliary duct dilatation. Pancreas: No pancreatic mass or inflammatory focus is evident. Spleen: No splenic lesions are evident. Adrenals/Urinary Tract: Adrenals appear unremarkable bilaterally. There are small cysts in the periphery of the left kidney, largest measuring 0.9 x 0.8 cm. There is no hydronephrosis on either side. There are no demonstrable renal or ureteral calculi on either side. The  urinary bladder is midline with wall thickness within normal limits. Stomach/Bowel: There are scattered sigmoid diverticula without diverticulitis. There is no bowel wall or mesenteric thickening. There is no bowel obstruction. No free air or portal venous air evident. Vascular/Lymphatic: There are is patchy atherosclerotic calcification in the aorta and common iliac arteries without evidence of abdominal aortic aneurysm. Major mesenteric vessels appear patent. There is no appreciable adenopathy in the abdomen or pelvis. Reproductive: Prostate and seminal vesicles appear normal in size and contour. There is no pelvic mass or pelvic fluid collection. Other: Appendix appears normal. There is no ascites or abscess in the abdomen or pelvis. There are no peritoneal or retroperitoneal/omental type implants. Musculoskeletal: There is degenerative change in the lower lumbar spine. There are no blastic or lytic bone lesions. There is no intramuscular or abdominal wall lesion. IMPRESSION: Nodular lesions noted in the left lung base. These nodular lesions are concerning for metastatic foci. A large right pleural effusion is noted with consolidation and compressive atelectasis throughout the right lung. A potential right-sided lung neoplasm could easily be obscured by these changes. The right effusion causes shift of heart and mediastinum as well as liver toward the left. No liver lesions are evident. No adenopathy. A neoplastic focus is not seen within the abdomen or pelvis. No bowel obstruction or inflammatory focus. No abscess. Appendix appears normal. No ascites. Electronically Signed   By: Lowella Grip III M.D.   On: 01/25/2016 14:01   Dg Chest Port 1 View  Result Date: 01/25/2016 CLINICAL DATA:  Shortness of breath. EXAM: PORTABLE CHEST 1 VIEW COMPARISON:  No prior . FINDINGS: Complete opacification of the right hemi thorax with shift of the mediastinum and heart to the left noted. Findings consist with large  right pleural effusion. Underlying pulmonary disease cannot be excluded. Left lung is clear. No pneumothorax . IMPRESSION: Complete opacification of the right hemi thorax with shift of the mediastinum and heart to the left. Findings consist with large right pleural effusion. Underlying right lung disease cannot be excluded . Electronically Signed   By: Marcello Moores  Register   On: 01/25/2016 09:41   US Thoracentesis Asp Pleural Space W/img Guide  Result Date: 01/25/2016 INDICATION: New onset shortness of breath with massive right pleural effusion. Request is made for diagnostic and therapeutic thoracentesis EXAM: ULTRASOUND GUIDED DIAGNOSTIC AND THERAPEUTIC THORACENTESIS MEDICATIONS: 1% lidocaine. COMPLICATIONS: None immediate. PROCEDURE: An ultrasound guided thoracentesis was thoroughly discussed with the patient and questions answered. The benefits, risks, alternatives and complications were also discussed. The patient understands and wishes to proceed with the procedure. Written consent was obtained. Ultrasound was performed to localize and mark an adequate pocket of fluid in the right chest. The area was then prepped and draped in the normal sterile fashion. 1% Lidocaine was used for local anesthesia. Under ultrasound guidance  a Safe-T-Centesis catheter was introduced. Thoracentesis was performed. The catheter was removed and a dressing applied. FINDINGS: A total of approximately 2 L of serosanguineous fluid was removed. Samples were sent to the laboratory as requested by the clinical team. IMPRESSION: Successful ultrasound guided right thoracentesis yielding 2 L of pleural fluid. A significant effusion remained after the procedure. The procedure was terminated after 2 L which is standard for first time thoracentesis. Read by: Saverio Danker, PA-C Electronically Signed   By: Lucrezia Europe M.D.   On: 01/25/2016 13:06  ->large right effusion w/ collapse   ASSESSMENT / PLAN:  Large right sided EXUDATIVE Pleural  effusion. Etiology unclear. Certainly malignancy on d-dx  Still w/ sig effusion, chest pressure and dyspnea   Plan -Therapeutic and diagnostic thoracentesis will repeat again (IR can do this again) - Following up cytology and culture data - add toradol for pain   We will see again Monday   Peter E Babcock ACNP-BC [place] Pager # [phone] OR # [phone] if no answer  01/26/2016, 2:52 PM  Attending Note:  55 year old male with a large exudative pleural effusion of unknown etiology who had a thora done via IR on 8/25.  On exam, decreased BS on the right.  I reviewed chest CT myself, very large pleural effusion with associated shift and compression of the right lung with compressive atelectasis.  Discussed with [facility]-MD and PCCM-NP.  Respiratory failure: due to large compressive pleural effusion  - IR to thora again.  Needs a therapeutic thora at this point.  - May require serial thoras.  Pleural effusion: very likely malignant.  - IR to thora.  - Await cytology.  Hypoxemia:  - Titrate O2 for sat of 88-92%.  Atelectasis:  - IS per RT protocol.  - Therapeutic thora.  PCCM will see again on Monday.  Patient seen and examined, agree with above note.  I dictated the care and orders written for this patient under my direction.  Rush Farmer, MD [phone]

## 2016-01-27 ENCOUNTER — Inpatient Hospital Stay (HOSPITAL_COMMUNITY): Payer: Medicare HMO

## 2016-01-27 DIAGNOSIS — J9601 Acute respiratory failure with hypoxia: Secondary | ICD-10-CM

## 2016-01-27 LAB — CBC
HCT: 36.8 % — ABNORMAL LOW (ref 39.0–52.0)
Hemoglobin: 11.6 g/dL — ABNORMAL LOW (ref 13.0–17.0)
MCH: 29.6 pg (ref 26.0–34.0)
MCHC: 31.5 g/dL (ref 30.0–36.0)
MCV: 93.9 fL (ref 78.0–100.0)
PLATELETS: 366 10*3/uL (ref 150–400)
RBC: 3.92 MIL/uL — AB (ref 4.22–5.81)
RDW: 14.1 % (ref 11.5–15.5)
WBC: 8.5 10*3/uL (ref 4.0–10.5)

## 2016-01-27 LAB — BASIC METABOLIC PANEL
Anion gap: 8 (ref 5–15)
BUN: 17 mg/dL (ref 6–20)
CHLORIDE: 103 mmol/L (ref 101–111)
CO2: 28 mmol/L (ref 22–32)
Calcium: 8.5 mg/dL — ABNORMAL LOW (ref 8.9–10.3)
Creatinine, Ser: 1.09 mg/dL (ref 0.61–1.24)
GFR calc Af Amer: >60 mL/min (ref >60)
GLUCOSE: 93 mg/dL (ref 65–99)
POTASSIUM: 3.8 mmol/L (ref 3.5–5.1)
Sodium: 139 mmol/L (ref 135–145)

## 2016-01-27 LAB — TSH: TSH: 7.308 u[IU]/mL — ABNORMAL HIGH (ref 0.350–4.500)

## 2016-01-27 LAB — MAGNESIUM: Magnesium: 2.4 mg/dL (ref 1.7–2.4)

## 2016-01-27 MED ORDER — LIDOCAINE HCL (PF) 1 % IJ SOLN
INTRAMUSCULAR | Status: AC
  Filled 2016-01-27: qty 10

## 2016-01-27 NOTE — Procedures (Signed)
Successful US guided right thoracentesis. Yielded 3L of bloody pleural fluid. Pt tolerated procedure well. No immediate complications.  Specimen was not sent for labs. CXR ordered.  Ascencion Dike PA-C 01/27/2016 1:09 PM

## 2016-01-27 NOTE — Progress Notes (Signed)
PROGRESS NOTE  Ezel Vallone KGY:[id number] DOB: [birth year] DOA: 01/25/2016 PCP: No PCP Per Patient  HPI/Recap of past 24 hours:  On 2liter oxygen,  continue c/o intermittent sharp chest pain and scapular pain on the right, seems better, able to sleep better last night, Remain sinus tachycardia  Assessment/Plan: Active Problems:   Pleural effusion, right   Pleural effusion   Elevated d-dimer   Abnormal EKG   Acute respiratory failure with hypoxia (HCC)   Atelectasis  Sob/large pleural effusion occupying most of the right hemithorax with mediastinum shift and liver shift to the left.  prior smoker, highly concerning for underline malignancy. S/p thoracentesis on 8/25 by IR with 2liter pleural fluids removal, then 8/27 with three liter pleural fluids removal, cytology pending, Will required multiple therapeutic thoracentesis for symptom relieve. Pulmonology and IR input appreciated.  Leukocytosis: from stress? Does not look septic, pleural fluids no organism seen, culture no growth so far, stop empiric abx  AKI: cr 1.32 on admission, cr normalized with ivf. D/c ivf.    Code Status: full  Family Communication: patient   Disposition Plan: remain in the hospital, likely home early next week   Consultants:  Pulmonology  IR  Procedures:  Thoracentesis 8/25, 8/27  Antibiotics:  Zosyn from admission to 8/27   Objective: BP 110/89 (BP Location: Left Arm)   Pulse (!) 103   Temp 98.6 F (37 C) (Oral)   Resp 19   Ht '5\' 10"'$  (1.778 m)   Wt 81.4 kg (179 lb 8 oz)   SpO2 98%   BMI 25.76 kg/m   Intake/Output Summary (Last 24 hours) at 01/27/16 1104 Last data filed at 01/27/16 0739  Gross per 24 hour  Intake              520 ml  Output              425 ml  Net               95 ml   Filed Weights   01/25/16 0732 01/26/16 0500 01/27/16 0510  Weight: 82.6 kg (182 lb) 83.2 kg (183 lb 8 oz) 81.4 kg (179 lb 8 oz)    Exam:   General:  NAD,  thin  Cardiovascular: sinus tachycardia  Respiratory: diminished breath sounds on right, clear on left  Abdomen: Soft/ND/NT, positive BS  Musculoskeletal: No Edema  Neuro: aaox3  Data Reviewed: Basic Metabolic Panel:  Recent Labs Lab 01/25/16 0755 01/26/16 0539 01/27/16 0525  NA 135 138 139  K 4.8 3.9 3.8  CL 94* 101 103  CO2 '26 28 28  '$ GLUCOSE 131* 96 93  BUN '19 19 17  '$ CREATININE 1.32* 1.08 1.09  CALCIUM 9.4 8.2* 8.5*  MG  --   --  2.4   Liver Function Tests:  Recent Labs Lab 01/25/16 0755  AST 34  ALT 35  ALKPHOS 88  BILITOT 0.9  PROT 7.7  ALBUMIN 3.1*    Recent Labs Lab 01/25/16 0755  LIPASE 51   No results for input(s): AMMONIA in the last 168 hours. CBC:  Recent Labs Lab 01/25/16 0755 01/26/16 0539 01/27/16 0525  WBC 14.4* 8.5 8.5  HGB 13.0 10.8* 11.6*  HCT 40.0 33.9* 36.8*  MCV 91.5 91.6 93.9  PLT 475* 343 366   Cardiac Enzymes:    Recent Labs Lab 01/25/16 1153 01/25/16 1755 01/25/16 2306  TROPONINI <0.03 <0.03 <0.03   BNP (last 3 results) No results for input(s): BNP in the last  8760 hours.  ProBNP (last 3 results) No results for input(s): PROBNP in the last 8760 hours.  CBG: No results for input(s): GLUCAP in the last 168 hours.  Recent Results (from the past 240 hour(s))  Culture, body fluid-bottle     Status: None (Preliminary result)   Collection Time: 01/25/16 12:44 PM  Result Value Ref Range Status   Specimen Description FLUID PLEURAL  Final   Special Requests NONE  Final   Culture NO GROWTH 1 DAY  Final   Report Status PENDING  Incomplete  Gram stain     Status: None   Collection Time: 01/25/16 12:44 PM  Result Value Ref Range Status   Specimen Description FLUID PLEURAL  Final   Special Requests NONE  Final   Gram Stain   Final    FEW WBC PRESENT,BOTH PMN AND MONONUCLEAR NO ORGANISMS SEEN    Report Status 01/25/2016 FINAL  Final  MRSA PCR Screening     Status: None   Collection Time: 01/25/16  3:25 PM   Result Value Ref Range Status   MRSA by PCR NEGATIVE NEGATIVE Final    Comment:        The GeneXpert MRSA Assay (FDA approved for NASAL specimens only), is one component of a comprehensive MRSA colonization surveillance program. It is not intended to diagnose MRSA infection nor to guide or monitor treatment for MRSA infections.      Studies: No results found.  Scheduled Meds: . piperacillin-tazobactam (ZOSYN)  IV  3.375 g Intravenous Q8H  . sodium chloride flush  3 mL Intravenous Q12H    Continuous Infusions:     Time spent: 31mns  Willer Osorno MD, PhD  [facility] Pager [phone] If 7PM-7AM, please contact night-coverage at [email or web address], password [facility]8/27/2017, 11:04 AM  LOS: 1 day

## 2016-01-27 NOTE — Progress Notes (Signed)
Called by [place] this morning regarding order for thoracentesis. Pt stated he didn't feel that he needed the thoracentesis at this time as he feels ok and would like to wait till tomorrow.  [place] made aware of patient's refusal to go down for procedure. Dr. Erlinda Hong on floor and made aware also however she felt like patient could really benefit from having procedure today instead of waiting as patient is tachycardic. After MD discussion with patient, patient agreed to go ahead with procedure today. Spoke with [place] who stated their PA leaves at noon today and may not be able to do procedure. Stated she will call me back and let me know for sure after she speaks with PA. Dr. Erlinda Hong made aware.

## 2016-01-28 LAB — CBC
HEMATOCRIT: 35.8 % — AB (ref 39.0–52.0)
HEMOGLOBIN: 11.3 g/dL — AB (ref 13.0–17.0)
MCH: 29.1 pg (ref 26.0–34.0)
MCHC: 31.6 g/dL (ref 30.0–36.0)
MCV: 92.3 fL (ref 78.0–100.0)
Platelets: 333 10*3/uL (ref 150–400)
RBC: 3.88 MIL/uL — AB (ref 4.22–5.81)
RDW: 14 % (ref 11.5–15.5)
WBC: 7.5 10*3/uL (ref 4.0–10.5)

## 2016-01-28 LAB — PH, BODY FLUID: pH, Body Fluid: 7.8

## 2016-01-28 LAB — BASIC METABOLIC PANEL
ANION GAP: 8 (ref 5–15)
BUN: 11 mg/dL (ref 6–20)
CHLORIDE: 102 mmol/L (ref 101–111)
CO2: 26 mmol/L (ref 22–32)
CREATININE: 0.84 mg/dL (ref 0.61–1.24)
Calcium: 8.3 mg/dL — ABNORMAL LOW (ref 8.9–10.3)
GFR calc non Af Amer: >60 mL/min (ref >60)
Glucose, Bld: 89 mg/dL (ref 65–99)
POTASSIUM: 3.5 mmol/L (ref 3.5–5.1)
SODIUM: 136 mmol/L (ref 135–145)

## 2016-01-28 LAB — T4, FREE: FREE T4: 0.81 ng/dL (ref 0.61–1.12)

## 2016-01-28 NOTE — Progress Notes (Signed)
Name: Roger Barnes MRN: [id number] DOB: [birth year]    ADMISSION DATE:  01/25/2016 CONSULTATION DATE:  01/25/16  REFERRING MD :  Allyson Sabal / [facility]   CHIEF COMPLAINT:   SOB / Pleural effusion   BRIEF PATIENT DESCRIPTION:  55 year old male w/ no significant medical history. 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 He presents to the ER on 8/25 w/ about 2 week h/o progressive shortness of breath, intermittent sharp chest pain and scapular pain, poor appetite, possible wt loss (~ 20 lbs). In ER CXR showed complete opacification of the right side w/ shift of mediastinum to the left which would favor effusion. PCCM asked to evaluate.    SIGNIFICANT EVENTS  8/25  Admit with 2 wk hx of SOB  STUDIES:  CT chest 8/25 >> No evident pulmonary embolus. Ascending thoracic aorta measures 4.0 x 3.9 cm in diameter. Large pleural effusion on the right causing shift of heart and mediastinum as well as liver toward the left. There is extensive compressive atelectasis in the right lung with potential underlying consolidation as well. Only a minimal amount of right lung is aerated. On the left, there are multiple nodular opacities concerning for metastatic foci. Largest of these lesions is in the posterior segment left lower lobe measuring 1.6 x 1.2 cm. There is no left lung edema or consolidation.  Site: right  Date:   Pleural fluid Serum   LDH: 674 LDH:n 175  Protein: 4.7 Protein:  Cholesterol:  Cholesterol:  Afb:   Gm stain: no orgs   Culture:   Fungal:    Hct:    Color: red and cloudy   Wbc: 1898   Neutrophils: 46%   Lymph:    Ph:    Rheumatoid factor:   Adenosine Deaminase:   Glucose:    Cytology:       Special notes:       SUBJECTIVE:  Pt reports some improvement in SOB after thora.  No fevers / chills.  Denies prior falls / mechanical injury.     VITAL SIGNS: Temp:  [97.5 F (36.4 C)-98 F (36.7 C)] 97.8 F (36.6 C) (08/28 0534) Pulse Rate:   [98-111] 98 (08/28 0534) Resp:  [21] 21 (08/27 1136) BP: (96-114)/(71-85) 114/71 (08/28 0534) SpO2:  [97 %] 97 % (08/28 0534) Weight:  [170 lb 14.4 oz (77.5 kg)] 170 lb 14.4 oz (77.5 kg) (08/28 0534)  2 liters    PHYSICAL EXAMINATION: General: adult male in NAD, sitting at bedside, pale  Neuro:  Awake, oriented, no focal def  HEENT:  NCAT, MMM, no JVD, temporal wasting  Cardiovascular:  RRR; no MRG Lungs:  Decreased on right; clear on left, no accessory use, dullness to percussion 2/3 way up Abdomen:  Soft, not tender. + bowel sounds  Musculoskeletal:  Equal strength and bulk  Skin:  Warm and dry, 1+ BLE ankle edema    Recent Labs Lab 01/26/16 0539 01/27/16 0525 01/28/16 0508  NA 138 139 136  K 3.9 3.8 3.5  CL 101 103 102  CO2 '28 28 26  '$ BUN '19 17 11  '$ CREATININE 1.08 1.09 0.84  GLUCOSE 96 93 89    Recent Labs Lab 01/26/16 0539 01/27/16 0525 01/28/16 0508  HGB 10.8* 11.6* 11.3*  HCT 33.9* 36.8* 35.8*  WBC 8.5 8.5 7.5  PLT 343 366 333   Dg Chest 1 View  Result Date: 01/27/2016 CLINICAL DATA:  Status post RIGHT thoracentesis EXAM: CHEST  1 VIEW COMPARISON:  CT 01/25/2016, radiograph [id number] FINDINGS: Large volume of fluid occupies the near entirety of the RIGHT hemi thorax. Small amount of aerated lung adjacent the RIGHT hilum is increased from radiograph 01/25/2016. No mediastinal shift. LEFT lung is clear. No pneumothorax. IMPRESSION: Minimal expansion of aerated RIGHT lung adjacent to the RIGHT hilum following thoracentesis. Persistent large volume fluid occupying the near entirety of the RIGHT hemi thorax. No pneumothorax appreciated. Electronically Signed   By: Suzy Bouchard M.D.   On: 01/27/2016 13:27   US Thoracentesis Asp Pleural Space W/img Guide  Result Date: 01/27/2016 INDICATION: Shortness of breath. Large right pleural effusion. Request repeat right thoracentesis. EXAM: ULTRASOUND GUIDED RIGHT THORACENTESIS MEDICATIONS: None. COMPLICATIONS: None immediate.  PROCEDURE: An ultrasound guided thoracentesis was thoroughly discussed with the patient and questions answered. The benefits, risks, alternatives and complications were also discussed. The patient understands and wishes to proceed with the procedure. Written consent was obtained. Ultrasound was performed to localize and mark an adequate pocket of fluid in the right chest. The area was then prepped and draped in the normal sterile fashion. 1% Lidocaine was used for local anesthesia. Under ultrasound guidance a Safe-T-Centesis catheter was introduced. Thoracentesis was performed. The catheter was removed and a dressing applied. FINDINGS: A total of approximately 3 L of bloody pleural fluid was removed. IMPRESSION: Successful ultrasound guided right thoracentesis yielding 3 L of pleural fluid. Read by: Ascencion Dike PA-C Electronically Signed   By: Lucrezia Europe M.D.   On: 01/27/2016 13:11    ASSESSMENT / PLAN:  Discussion: 55 year old male, former smoker (quit 2007), with a large exudative pleural effusion of unknown etiology who had a thora done via IR on 8/25.  CT with very large R pleural effusion with associated shift and compression of the right lung with compressive atelectasis.  Concerning for malignancy.  Initial Thora on 8/25 with 2L fluid removed.  Repeat on 8/27 with 3L bloody pleural fluid removed.    Large Right EXUDATIVE Pleural effusion - Etiology unclear.  Still w/ sig effusion, chest pressure and dyspnea 8/27.  Repeat thora for dyspnea relief.  Cytology pending. No repeat labs completed on 8/27 thora.    Compressive Atelectasis   Former Smoker   Plan: Appreciate IR assistance for thoracentesis  Await cytology / pleural cultures  Toradol PRN for pain  Intermittent CXR to follow fluid accumulation  O2 as needed for saturations >92%  Pulmonary hygiene    Noe Gens, NP-C [facility] Pgr: [phone] or if no answer [phone] 01/28/2016, 10:35 AM   Attending  Note:  55 year old male with a large exudative pleural effusion of unknown etiology who had a thora done via IR on 8/25.  On exam, decreased BS on the right.  I reviewed chest CT myself, very large pleural effusion with associated shift and compression of the right lung with compressive atelectasis.  Discussed with [facility]-MD and PCCM-NP.  Respiratory failure: due to large compressive pleural effusion                       - IR to thora as needed from a therapeutic nature.            - Recommend leaving some pleural fluid incase pleurex catheter is needed.                       - May require serial thoras.  Pleural effusion: very likely malignant.                       -  Do not tap dry.                       - Await cytology.  Hypoxemia:                       - Titrate O2 for sat of 88-92%.  Atelectasis:                       - IS per RT protocol.                       - Therapeutic as needed.  PCCM will follow.  Patient seen and examined, agree with above note.  I dictated the care and orders written for this patient under my direction.  Rush Farmer, MD [phone]

## 2016-01-28 NOTE — Progress Notes (Signed)
PROGRESS NOTE  Roger Barnes TFT:[id number] DOB: 10-23-1960 DOA: 01/25/2016 PCP: No PCP Per Patient  HPI/Recap of past 24 hours:  Off oxygen,  Less intermittent sharp chest pain and scapular pain on the right,  less sinus tachycardia, no fever  Assessment/Plan: Active Problems:   Pleural effusion, right   Pleural effusion   Elevated d-dimer   Abnormal EKG   Acute respiratory failure with hypoxia (HCC)   Atelectasis  Sob/large pleural effusion occupying most of the right hemithorax with mediastinum shift and liver shift to the left.  prior smoker, highly concerning for underline malignancy. S/p thoracentesis on 8/25 by IR with 2liter pleural fluids removal, then 8/27 with three liter pleural fluids removal, cytology pending, Will required multiple therapeutic thoracentesis for symptom relieve. Pulmonology and IR input appreciated.  Leukocytosis: from stress? Does not look septic, pleural fluids no organism seen, culture no growth so far, stop empiric abx. Wbc normalized.  AKI: cr 1.32 on admission, cr normalized with ivf. D/c ivf.    Code Status: full  Family Communication: patient   Disposition Plan: remain in the hospital, likely home mid week   Consultants:  Pulmonology  IR  Procedures:  Thoracentesis 8/25, 8/27  Antibiotics:  Zosyn from admission to 8/27   Objective: BP 114/71 (BP Location: Right Arm)   Pulse 98   Temp 97.8 F (36.6 C) (Oral)   Resp (!) 21   Ht '5\' 10"'$  (1.778 m)   Wt 77.5 kg (170 lb 14.4 oz)   SpO2 97%   BMI 24.52 kg/m   Intake/Output Summary (Last 24 hours) at 01/28/16 1344 Last data filed at 01/28/16 0849  Gross per 24 hour  Intake              480 ml  Output              150 ml  Net              330 ml   Filed Weights   01/26/16 0500 01/27/16 0510 01/28/16 0534  Weight: 83.2 kg (183 lb 8 oz) 81.4 kg (179 lb 8 oz) 77.5 kg (170 lb 14.4 oz)    Exam:   General:  NAD, thin  Cardiovascular: less sinus  tachycardia  Respiratory: diminished breath sounds on right, clear on left  Abdomen: Soft/ND/NT, positive BS  Musculoskeletal: No Edema  Neuro: aaox3  Data Reviewed: Basic Metabolic Panel:  Recent Labs Lab 01/25/16 0755 01/26/16 0539 01/27/16 0525 01/28/16 0508  NA 135 138 139 136  K 4.8 3.9 3.8 3.5  CL 94* 101 103 102  CO2 '26 28 28 26  '$ GLUCOSE 131* 96 93 89  BUN '19 19 17 11  '$ CREATININE 1.32* 1.08 1.09 0.84  CALCIUM 9.4 8.2* 8.5* 8.3*  MG  --   --  2.4  --    Liver Function Tests:  Recent Labs Lab 01/25/16 0755  AST 34  ALT 35  ALKPHOS 88  BILITOT 0.9  PROT 7.7  ALBUMIN 3.1*    Recent Labs Lab 01/25/16 0755  LIPASE 51   No results for input(s): AMMONIA in the last 168 hours. CBC:  Recent Labs Lab 01/25/16 0755 01/26/16 0539 01/27/16 0525 01/28/16 0508  WBC 14.4* 8.5 8.5 7.5  HGB 13.0 10.8* 11.6* 11.3*  HCT 40.0 33.9* 36.8* 35.8*  MCV 91.5 91.6 93.9 92.3  PLT 475* 343 366 333   Cardiac Enzymes:    Recent Labs Lab 01/25/16 1153 01/25/16 1755 01/25/16 2306  TROPONINI <0.03 <  0.03 <0.03   BNP (last 3 results) No results for input(s): BNP in the last 8760 hours.  ProBNP (last 3 results) No results for input(s): PROBNP in the last 8760 hours.  CBG: No results for input(s): GLUCAP in the last 168 hours.  Recent Results (from the past 240 hour(s))  Culture, body fluid-bottle     Status: None (Preliminary result)   Collection Time: 01/25/16 12:44 PM  Result Value Ref Range Status   Specimen Description FLUID PLEURAL  Final   Special Requests NONE  Final   Culture NO GROWTH 2 DAYS  Final   Report Status PENDING  Incomplete  Gram stain     Status: None   Collection Time: 01/25/16 12:44 PM  Result Value Ref Range Status   Specimen Description FLUID PLEURAL  Final   Special Requests NONE  Final   Gram Stain   Final    FEW WBC PRESENT,BOTH PMN AND MONONUCLEAR NO ORGANISMS SEEN    Report Status 01/25/2016 FINAL  Final  MRSA PCR  Screening     Status: None   Collection Time: 01/25/16  3:25 PM  Result Value Ref Range Status   MRSA by PCR NEGATIVE NEGATIVE Final    Comment:        The GeneXpert MRSA Assay (FDA approved for NASAL specimens only), is one component of a comprehensive MRSA colonization surveillance program. It is not intended to diagnose MRSA infection nor to guide or monitor treatment for MRSA infections.      Studies: No results found.  Scheduled Meds: . sodium chloride flush  3 mL Intravenous Q12H    Continuous Infusions:     Time spent: 70mns  Roger Frary MD, PhD  [facility] Pager [phone] If 7PM-7AM, please contact night-coverage at [email or web address], password [email or web address]8/28/2017, 1:44 PM  LOS: 2 days

## 2016-01-29 ENCOUNTER — Inpatient Hospital Stay (HOSPITAL_COMMUNITY): Payer: Medicare HMO

## 2016-01-29 LAB — BASIC METABOLIC PANEL
Anion gap: 5 (ref 5–15)
BUN: 9 mg/dL (ref 6–20)
CO2: 29 mmol/L (ref 22–32)
Calcium: 8 mg/dL — ABNORMAL LOW (ref 8.9–10.3)
Chloride: 103 mmol/L (ref 101–111)
Creatinine, Ser: 0.7 mg/dL (ref 0.61–1.24)
GFR calc Af Amer: >60 mL/min (ref >60)
GLUCOSE: 117 mg/dL — AB (ref 65–99)
POTASSIUM: 3.6 mmol/L (ref 3.5–5.1)
Sodium: 137 mmol/L (ref 135–145)

## 2016-01-29 LAB — T3, FREE: T3 FREE: 2.3 pg/mL (ref 2.0–4.4)

## 2016-01-29 LAB — MAGNESIUM: Magnesium: 2.2 mg/dL (ref 1.7–2.4)

## 2016-01-29 NOTE — Progress Notes (Signed)
PROGRESS NOTE  Roger Barnes BHA:[id number] DOB: 1960-10-16 DOA: 01/25/2016 PCP: No PCP Per Patient  Brief summary:   Roger Barnes is a 55 y.o. male with no significant PMH. Patient has not seen a healthcare provider in many years.. He takes no over-the-counter or prescription medications. Approximately 2 weeks ago patient began to get short of breath with just minimal exertion.. He has intermittent sharp pain in right scapula. No chest pain. Patient does complain of upper abdominal discomfort described as a bloated sensation. No nausea or vomiting but appetite has been suboptimal. Patient thinks he is also 5-10 pounds over the last couple of weeks. No cough . No fevers or night sweats . Chest x-ray shows large right pleural effusion. Patient has a history of smoking approximately 10 years ago.   He is found to have opacification of the right lung with large right pleural effusion with shift of the mediastinal to the left  HPI/Recap of past 24 hours:  Off oxygen,  Less intermittent sharp chest pain and scapular pain on the right,  less sinus tachycardia, no fever, no cough  Assessment/Plan: Active Problems:   Pleural effusion, right   Pleural effusion   Elevated d-dimer   Abnormal EKG   Acute respiratory failure with hypoxia (HCC)   Atelectasis  Sob/large pleural effusion occupying most of the right hemithorax with mediastinum shift and liver shift to the left.  prior smoker, highly concerning for underline malignancy. S/p thoracentesis on 8/25 by IR with 2liter pleural fluids removal, then 8/27 with three liter pleural fluids removal, cytology pending, Will required multiple therapeutic thoracentesis for symptom relieve. Next US guided thorcentesis ordered to be done on 8/30,  Pulmonology and IR input appreciated.  Leukocytosis: from stress? Does not look septic, pleural fluids no organism seen, culture no growth so far, stop empiric abx. Wbc normalized.  AKI: cr 1.32  on admission, cr normalized with ivf. D/c ivf.    Code Status: full  Family Communication: patient   Disposition Plan: remain in the hospital, likely home in a few day   Consultants:  Pulmonology  IR  Procedures:  Thoracentesis 8/25, 8/27  Antibiotics:  Zosyn from admission to 8/27   Objective: BP 122/78 (BP Location: Left Arm)   Pulse 93   Temp 98.2 F (36.8 C) (Oral)   Resp 18   Ht '5\' 10"'$  (1.778 m)   Wt 79.2 kg (174 lb 8 oz)   SpO2 97%   BMI 25.04 kg/m   Intake/Output Summary (Last 24 hours) at 01/29/16 1814 Last data filed at 01/29/16 1808  Gross per 24 hour  Intake              960 ml  Output                0 ml  Net              960 ml   Filed Weights   01/27/16 0510 01/28/16 0534 01/29/16 0517  Weight: 81.4 kg (179 lb 8 oz) 77.5 kg (170 lb 14.4 oz) 79.2 kg (174 lb 8 oz)    Exam:   General:  NAD, thin  Cardiovascular: less sinus tachycardia  Respiratory: diminished breath sounds on right, clear on left  Abdomen: Soft/ND/NT, positive BS  Musculoskeletal: No Edema  Neuro: aaox3  Data Reviewed: Basic Metabolic Panel:  Recent Labs Lab 01/25/16 0755 01/26/16 0539 01/27/16 0525 01/28/16 0508 01/29/16 0357  NA 135 138 139 136 137  K 4.8 3.9  3.8 3.5 3.6  CL 94* 101 103 102 103  CO2 '26 28 28 26 29  '$ GLUCOSE 131* 96 93 89 117*  BUN '19 19 17 11 9  '$ CREATININE 1.32* 1.08 1.09 0.84 0.70  CALCIUM 9.4 8.2* 8.5* 8.3* 8.0*  MG  --   --  2.4  --  2.2   Liver Function Tests:  Recent Labs Lab 01/25/16 0755  AST 34  ALT 35  ALKPHOS 88  BILITOT 0.9  PROT 7.7  ALBUMIN 3.1*    Recent Labs Lab 01/25/16 0755  LIPASE 51   No results for input(s): AMMONIA in the last 168 hours. CBC:  Recent Labs Lab 01/25/16 0755 01/26/16 0539 01/27/16 0525 01/28/16 0508  WBC 14.4* 8.5 8.5 7.5  HGB 13.0 10.8* 11.6* 11.3*  HCT 40.0 33.9* 36.8* 35.8*  MCV 91.5 91.6 93.9 92.3  PLT 475* 343 366 333   Cardiac Enzymes:    Recent Labs Lab  01/25/16 1153 01/25/16 1755 01/25/16 2306  TROPONINI <0.03 <0.03 <0.03   BNP (last 3 results) No results for input(s): BNP in the last 8760 hours.  ProBNP (last 3 results) No results for input(s): PROBNP in the last 8760 hours.  CBG: No results for input(s): GLUCAP in the last 168 hours.  Recent Results (from the past 240 hour(s))  Culture, body fluid-bottle     Status: None (Preliminary result)   Collection Time: 01/25/16 12:44 PM  Result Value Ref Range Status   Specimen Description FLUID PLEURAL  Final   Special Requests NONE  Final   Culture NO GROWTH 2 DAYS  Final   Report Status PENDING  Incomplete  Gram stain     Status: None   Collection Time: 01/25/16 12:44 PM  Result Value Ref Range Status   Specimen Description FLUID PLEURAL  Final   Special Requests NONE  Final   Gram Stain   Final    FEW WBC PRESENT,BOTH PMN AND MONONUCLEAR NO ORGANISMS SEEN    Report Status 01/25/2016 FINAL  Final  MRSA PCR Screening     Status: None   Collection Time: 01/25/16  3:25 PM  Result Value Ref Range Status   MRSA by PCR NEGATIVE NEGATIVE Final    Comment:        The GeneXpert MRSA Assay (FDA approved for NASAL specimens only), is one component of a comprehensive MRSA colonization surveillance program. It is not intended to diagnose MRSA infection nor to guide or monitor treatment for MRSA infections.      Studies: Dg Chest 2 View  Result Date: 01/29/2016 CLINICAL DATA:  Status post thoracentesis on January 27, 2016. Persistent shortness of breath. No chest pain. EXAM: CHEST  2 VIEW COMPARISON:  Portable chest x-ray of January 27, 2016 FINDINGS: There remains near total opacification of the right hemi thorax. A small amount of aerated lung is noted in the right paratracheal region. The left lung is well-expanded. There is no focal infiltrate. There is no significant mediastinal shift. The left heart border is normal. The pulmonary vascularity is not engorged. IMPRESSION:  Little change in the appearance of the nearly totally opacified right hemi thorax. A large pleural effusion likely remains. There is no pneumothorax. The left lung is clear. Electronically Signed   By: David  Martinique M.D.   On: 01/29/2016 07:55    Scheduled Meds: . sodium chloride flush  3 mL Intravenous Q12H    Continuous Infusions:     Time spent: 28mns  Roger Tapanes MD, PhD  [facility] Pager [phone]. If 7PM-7AM, please contact night-coverage at [email or web address], password [facility] 01/29/2016, 6:14 PM  LOS: 3 days

## 2016-01-30 ENCOUNTER — Inpatient Hospital Stay (HOSPITAL_COMMUNITY): Payer: Medicare HMO

## 2016-01-30 DIAGNOSIS — C3491 Malignant neoplasm of unspecified part of right bronchus or lung: Secondary | ICD-10-CM

## 2016-01-30 LAB — CULTURE, BODY FLUID W GRAM STAIN -BOTTLE

## 2016-01-30 LAB — BASIC METABOLIC PANEL
ANION GAP: 7 (ref 5–15)
BUN: 9 mg/dL (ref 6–20)
CALCIUM: 8.3 mg/dL — AB (ref 8.9–10.3)
CHLORIDE: 103 mmol/L (ref 101–111)
CO2: 29 mmol/L (ref 22–32)
Creatinine, Ser: 0.73 mg/dL (ref 0.61–1.24)
GFR calc non Af Amer: >60 mL/min (ref >60)
Glucose, Bld: 99 mg/dL (ref 65–99)
Potassium: 3.9 mmol/L (ref 3.5–5.1)
Sodium: 139 mmol/L (ref 135–145)

## 2016-01-30 LAB — CBC
HEMATOCRIT: 35.6 % — AB (ref 39.0–52.0)
HEMOGLOBIN: 10.8 g/dL — AB (ref 13.0–17.0)
MCH: 28.3 pg (ref 26.0–34.0)
MCHC: 30.3 g/dL (ref 30.0–36.0)
MCV: 93.4 fL (ref 78.0–100.0)
Platelets: 310 10*3/uL (ref 150–400)
RBC: 3.81 MIL/uL — ABNORMAL LOW (ref 4.22–5.81)
RDW: 14.2 % (ref 11.5–15.5)
WBC: 7.6 10*3/uL (ref 4.0–10.5)

## 2016-01-30 LAB — CULTURE, BODY FLUID-BOTTLE: CULTURE: NO GROWTH

## 2016-01-30 LAB — GRAM STAIN

## 2016-01-30 MED ORDER — LIDOCAINE HCL 1 % IJ SOLN
INTRAMUSCULAR | Status: AC
  Filled 2016-01-30: qty 20

## 2016-01-30 MED ORDER — GADOBENATE DIMEGLUMINE 529 MG/ML IV SOLN
20.0000 mL | Freq: Once | INTRAVENOUS | Status: AC
  Administered 2016-01-30: 17 mL via INTRAVENOUS

## 2016-01-30 NOTE — Procedures (Signed)
Successful US guided right thoracentesis. Yielded 2L of dark bloody fluid. Pt complained of right chest tightness and pain. Procedure stopped at this point. No immediate complications.  Specimen was sent for labs. CXR ordered.  Ascencion Dike PA-C 01/30/2016 10:42 AM

## 2016-01-30 NOTE — Progress Notes (Signed)
PROGRESS NOTE  Roger Barnes TWS:[id number] DOB: [birth year] DOA: 01/25/2016 PCP: No PCP Per Patient  Brief summary:   Roger Barnes is a 55 y.o. male with no significant PMH. Patient has not seen a healthcare provider in many years.. He takes no over-the-counter or prescription medications. Approximately 2 weeks ago patient began to get short of breath with just minimal exertion.. He has intermittent sharp pain in right scapula. No chest pain. Patient does complain of upper abdominal discomfort described as a bloated sensation. No nausea or vomiting but appetite has been suboptimal. Patient thinks he is also 5-10 pounds over the last couple of weeks. No cough . No fevers or night sweats . Chest x-ray shows large right pleural effusion. Patient has a history of smoking approximately 10 years ago.   He is found to have opacification of the right lung with large right pleural effusion with shift of the mediastinal to the left  HPI/Recap of past 24 hours: Just came from procedure, thoracentesis.  Report mild chest tightness.  Denies worsening dyspnea.   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  S/P thoracentesis 8/30,  2 L removed.  -Pulmonology and IR input appreciated.  -Dr Julien Nordmann will arrange outpatient follow up.  -CVTS consulted for evaluation for Pleur-x catheter placement.   Leukocytosis: from stress? Does not look septic, pleural fluids no organism seen, culture no growth so far, stop  empiric abx. Wbc normalized.  AKI: cr 1.32 on admission, cr normalized with ivf. D/c ivf.    Code Status: full  Family Communication: patient   Disposition Plan: remain in the hospital, likely home in a few day   Consultants:  Pulmonology  IR  Procedures:  Thoracentesis 8/25, 8/27  Antibiotics:  Zosyn from admission to 8/27   Objective: BP 128/88 (BP Location: Left Arm)   Pulse (!) 115   Temp 98.2 F (36.8 C) (Oral)   Resp (!) 26   Ht '5\' 10"'$  (1.778 m)   Wt 79.1 kg (174 lb 6.4 oz)   SpO2 97%   BMI 25.02 kg/m   Intake/Output Summary (Last 24 hours) at 01/30/16 1522 Last data filed at 01/30/16 1400  Gross per 24 hour  Intake             1200 ml  Output              400 ml  Net              800 ml   Filed Weights   01/28/16 0534 01/29/16 0517 01/30/16 0635  Weight: 77.5 kg (170 lb 14.4 oz) 79.2 kg (174 lb 8 oz) 79.1 kg (174 lb 6.4 oz)    Exam:   General:  NAD, thin  Cardiovascular: less sinus tachycardia  Respiratory: diminished breath sounds on right, clear on left  Abdomen: Soft/ND/NT, positive BS  Musculoskeletal: No Edema  Neuro: aaox3  Data Reviewed: Basic Metabolic Panel:  Recent Labs Lab 01/26/16 0539 01/27/16 0525 01/28/16 0508 01/29/16 0357 01/30/16 0335  NA 138 139 136 137 139  K 3.9  3.8 3.5 3.6 3.9  CL 101 103 102 103 103  CO2 '28 28 26 29 29  '$ GLUCOSE 96 93 89 117* 99  BUN '19 17 11 9 9  '$ CREATININE 1.08 1.09 0.84 0.70 0.73  CALCIUM 8.2* 8.5* 8.3* 8.0* 8.3*  MG  --  2.4  --  2.2  --    Liver Function Tests:  Recent Labs Lab 01/25/16 0755  AST 34  ALT 35  ALKPHOS 88  BILITOT 0.9  PROT 7.7  ALBUMIN 3.1*    Recent Labs Lab 01/25/16 0755  LIPASE 51   No results for input(s): AMMONIA in the last 168 hours. CBC:  Recent Labs Lab 01/25/16 0755 01/26/16 0539 01/27/16 0525 01/28/16 0508 01/30/16 0335  WBC 14.4* 8.5 8.5 7.5 7.6  HGB 13.0 10.8* 11.6* 11.3* 10.8*  HCT 40.0 33.9* 36.8* 35.8* 35.6*  MCV 91.5  91.6 93.9 92.3 93.4  PLT 475* 343 366 333 310   Cardiac Enzymes:    Recent Labs Lab 01/25/16 1153 01/25/16 1755 01/25/16 2306  TROPONINI <0.03 <0.03 <0.03   BNP (last 3 results) No results for input(s): BNP in the last 8760 hours.  ProBNP (last 3 results) No results for input(s): PROBNP in the last 8760 hours.  CBG: No results for input(s): GLUCAP in the last 168 hours.  Recent Results (from the past 240 hour(s))  Culture, body fluid-bottle     Status: None (Preliminary result)   Collection Time: 01/25/16 12:44 PM  Result Value Ref Range Status   Specimen Description FLUID PLEURAL  Final   Special Requests NONE  Final   Culture NO GROWTH 2 DAYS  Final   Report Status PENDING  Incomplete  Gram stain     Status: None   Collection Time: 01/25/16 12:44 PM  Result Value Ref Range Status   Specimen Description FLUID PLEURAL  Final   Special Requests NONE  Final   Gram Stain   Final    FEW WBC PRESENT,BOTH PMN AND MONONUCLEAR NO ORGANISMS SEEN    Report Status 01/25/2016 FINAL  Final  MRSA PCR Screening     Status: None   Collection Time: 01/25/16  3:25 PM  Result Value Ref Range Status   MRSA by PCR NEGATIVE NEGATIVE Final    Comment:        The GeneXpert MRSA Assay (FDA approved for NASAL specimens only), is one component of a comprehensive MRSA colonization surveillance program. It is not intended to diagnose MRSA infection nor to guide or monitor treatment for MRSA infections.   Gram stain     Status: None   Collection Time: 01/30/16 10:27 AM  Result Value Ref Range Status   Specimen Description FLUID RIGHT PLEURAL  Final   Special Requests NONE  Final   Gram Stain   Final    ABUNDANT WBC PRESENT,BOTH PMN AND MONONUCLEAR NO ORGANISMS SEEN    Report Status 01/30/2016 FINAL  Final     Studies: Dg Chest 2 View  Result Date: 01/30/2016 CLINICAL DATA:  Pleural effusion status post thoracentesis. EXAM: CHEST  2 VIEW COMPARISON:  01/29/2016, 01/27/2016  FINDINGS: Normal cardiac silhouette. Mild expansion of the RIGHT lower lobe following thoracentesis. Minimal expansion of RIGHT upper lobe. Large RIGHT effusion remains. LEFT lung is clear knee No pneumothorax. IMPRESSION: Mild expansion of the RIGHT lower lobe following thoracentesis. No pneumothorax. Large RIGHT effusion remains. Electronically Signed   By: Suzy Bouchard M.D.   On: 01/30/2016 11:04   US Thoracentesis Asp Pleural Space  W/img Guide  Result Date: 01/30/2016 INDICATION: Recurrent malignant right pleural effusion. Request repeat thoracentesis. EXAM: ULTRASOUND GUIDED RIGHT THORACENTESIS MEDICATIONS: None. COMPLICATIONS: None immediate. PROCEDURE: An ultrasound guided thoracentesis was thoroughly discussed with the patient and questions answered. The benefits, risks, alternatives and complications were also discussed. The patient understands and wishes to proceed with the procedure. Written consent was obtained. Ultrasound was performed to localize and mark an adequate pocket of fluid in the right chest. The area was then prepped and draped in the normal sterile fashion. 1% Lidocaine was used for local anesthesia. Under ultrasound guidance a Safe-T-Centesis catheter was introduced. Thoracentesis was performed. The patient complained of significant discomfort and chest tightness on the right side. The procedure was stopped at this point. The catheter was removed and a dressing applied. FINDINGS: A total of approximately 2 L of dark bloody fluid was removed. Samples were sent to the laboratory as requested by the clinical team. Residual effusion remains. IMPRESSION: Successful ultrasound guided right thoracentesis yielding 2 L of pleural fluid. Read by: Ascencion Dike PA-C Electronically Signed   By: Sandi Mariscal M.D.   On: 01/30/2016 10:45    Scheduled Meds: . lidocaine      . sodium chloride flush  3 mL Intravenous Q12H    Continuous Infusions:     Time spent: 65mns  RElmarie ShileyMD [facility] Pager [phone] If 7PM-7AM, please contact night-coverage at [email or web address], password [email or web address]8/30/2017, 3:22 PM  LOS: 4 days

## 2016-01-30 NOTE — Progress Notes (Signed)
Name: Roger Barnes MRN: [id number] DOB: 1961-02-22    ADMISSION DATE:  01/25/2016 CONSULTATION DATE:  01/25/16  REFERRING MD :  Allyson Sabal / [facility]   CHIEF COMPLAINT:   SOB / Pleural effusion   BRIEF PATIENT DESCRIPTION:  55 year old male w/ no significant medical history. 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 He presents to the ER on 8/25 w/ about 2 week h/o progressive shortness of breath, intermittent sharp chest pain and scapular pain, poor appetite, possible wt loss (~ 20 lbs). In ER CXR showed complete opacification of the right side w/ shift of mediastinum to the left which would favor effusion. PCCM asked to evaluate.    SIGNIFICANT EVENTS  8/25  Admit with 2 wk hx of SOB, pleural effusion s/p thora 8/27  Repeat thora, 3L removed  8/30  Cytology returned with adenocarcinoma    STUDIES:  CT chest 8/25 >> No evident pulmonary embolus. Ascending thoracic aorta measures 4.0 x 3.9 cm in diameter. Large pleural effusion on the right causing shift of heart and mediastinum as well as liver toward the left. There is extensive compressive atelectasis in the right lung with potential underlying consolidation as well. Only a minimal amount of right lung is aerated. On the left, there are multiple nodular opacities concerning for metastatic foci. Largest of these lesions is in the posterior segment left lower lobe measuring 1.6 x 1.2 cm. There is no left lung edema or consolidation.  Site: right  Date:   Pleural fluid Serum   LDH: 674 LDH:n 175  Protein: 4.7 Protein:  Cholesterol:  Cholesterol:  Afb:   Gm stain: no orgs   Culture:   Fungal:    Hct:    Color: red and cloudy   Wbc: 1898   Neutrophils: 46%   Lymph:    Ph:    Rheumatoid factor:   Adenosine Deaminase:   Glucose:    Cytology:  The neoplasm stains positive for ck7, negative for TTF-1, ck5/6, and calrectin. The morphology and staining pattern favor adenocarcinoma.     Special  notes:       SUBJECTIVE:   Pt reports ongoing issues with diarrhea after eating the hospital eggs.  Denies significant shortness of breath.  Occasional chest pain   VITAL SIGNS: Temp:  [98.2 F (36.8 C)-98.7 F (37.1 C)] 98.6 F (37 C) (08/30 0635) Pulse Rate:  [93-98] 94 (08/30 0635) Resp:  [16-18] 18 (08/30 0635) BP: (114-125)/(76-88) 125/88 (08/30 0635) SpO2:  [97 %-98 %] 98 % (08/30 0635) Weight:  [174 lb 6.4 oz (79.1 kg)] 174 lb 6.4 oz (79.1 kg) (08/30 0635)  Room air   PHYSICAL EXAMINATION: General: adult male in NAD, sitting at bedside, pale  Neuro:  Awake, oriented, no focal def, normal strength HEENT:  NCAT, MMM, no JVD, temporal wasting  Cardiovascular:  RRR; no MRG Lungs:  Decreased on right; clear on left, no accessory use, dullness to percussion on right Abdomen:  Soft, not tender. + bowel sounds  Musculoskeletal:  Equal strength and bulk  Skin:  Warm and dry, 1+ BLE ankle edema    Recent Labs Lab 01/28/16 0508 01/29/16 0357 01/30/16 0335  NA 136 137 139  K 3.5 3.6 3.9  CL 102 103 103  CO2 '26 29 29  '$ BUN '11 9 9  '$ CREATININE 0.84 0.70 0.73  GLUCOSE 89 117* 99    Recent Labs Lab 01/27/16 0525 01/28/16 0508 01/30/16 0335  HGB  11.6* 11.3* 10.8*  HCT 36.8* 35.8* 35.6*  WBC 8.5 7.5 7.6  PLT 366 333 310   Dg Chest 2 View  Result Date: 01/29/2016 CLINICAL DATA:  Status post thoracentesis on January 27, 2016. Persistent shortness of breath. No chest pain. EXAM: CHEST  2 VIEW COMPARISON:  Portable chest x-ray of January 27, 2016 FINDINGS: There remains near total opacification of the right hemi thorax. A small amount of aerated lung is noted in the right paratracheal region. The left lung is well-expanded. There is no focal infiltrate. There is no significant mediastinal shift. The left heart border is normal. The pulmonary vascularity is not engorged. IMPRESSION: Little change in the appearance of the nearly totally opacified right hemi thorax. A large pleural  effusion likely remains. There is no pneumothorax. The left lung is clear. Electronically Signed   By: David  Martinique M.D.   On: 01/29/2016 07:55    ASSESSMENT / PLAN:  Discussion: 55 year old male, former smoker (quit 2007), with a large exudative pleural effusion of unknown etiology who had a thora done via IR on 8/25.  CT with very large R pleural effusion with associated shift and compression of the right lung with compressive atelectasis.  Initial Thora on 8/25 with 2L fluid removed.  Repeat on 8/27 with 3L bloody pleural fluid removed.  Cytology returned on 8/30 with malignant cells > the neoplasm stains positive for ck7, negative for TTF-1, ck5/6, and calrectin. The morphology and staining pattern favor adenocarcinoma.   Large Right Malignant Pleural Effusion - positive malignant cells from thoracentesis, likely adenocarcinoma.    Compressive Atelectasis   Former Smoker   Plan: Appreciate IR assistance for thoracentesis  ONC consultation called am 8/30, appreciate assistance  Await pleural cultures  Assess ambulatory O2 needs prior to discharge  Consider Pleur-X catheter placement for dyspnea relief.  Inpatient vs follow up with [facility] as an outpatient with CXR. Toradol PRN for pain  Intermittent CXR to follow fluid accumulation  O2 as needed for saturations >92%  Pulmonary hygiene   Patient updated on thoracentesis cytology results.    Noe Gens, NP-C [facility] Pgr: [phone] or if no answer [phone] 01/30/2016, 8:47 AM  Attending Note:  55 year old male with a large exudative pleural effusion of unknown etiology who had a thora done via IR on 8/25. On exam, decreased BS on the right. I reviewed chest CT myself, very large pleural effusion with associated shift and compression of the right lung with compressive atelectasis. Discussed with [facility]-MD and PCCM-NP.  Path with adenocarcinoma.  Patient notified.  Respiratory failure: due to large  compressive pleural effusion - IR to thora as needed from a therapeutic nature - 2L negative today.                       - CVTS to see for pleurex rate of accumulation is very.  Pleural effusion: very likely malignant. - CVTS to see. - Serial x-rays.  Hypoxemia: - Titrate O2 for sat of 88-92%.  Atelectasis: - IS per RT protocol. - Therapeutic as needed.  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   - Needs staging.  - Oncology will see as outpatient.  PCCM will sign off, please call back if needed.  Patient seen and examined, agree with above note. I dictated the care and orders written for this patient under my direction.  Rush Farmer, MD [phone]

## 2016-01-31 ENCOUNTER — Encounter: Payer: Self-pay | Admitting: *Deleted

## 2016-01-31 ENCOUNTER — Encounter (HOSPITAL_COMMUNITY): Payer: Self-pay | Admitting: Cardiothoracic Surgery

## 2016-01-31 DIAGNOSIS — C3490 Malignant neoplasm of unspecified part of unspecified bronchus or lung: Secondary | ICD-10-CM

## 2016-01-31 DIAGNOSIS — J90 Pleural effusion, not elsewhere classified: Secondary | ICD-10-CM

## 2016-01-31 DIAGNOSIS — J910 Malignant pleural effusion: Secondary | ICD-10-CM

## 2016-01-31 LAB — BLOOD GAS, ARTERIAL
Acid-Base Excess: 2.6 mmol/L — ABNORMAL HIGH (ref 0.0–2.0)
Bicarbonate: 25.9 mmol/L (ref 20.0–28.0)
Drawn by: 305991
FIO2: 0.21
O2 Saturation: 97.3 %
Patient temperature: 98.6
pCO2 arterial: 34.7 mmHg (ref 32.0–48.0)
pH, Arterial: 7.486 — ABNORMAL HIGH (ref 7.350–7.450)
pO2, Arterial: 87 mmHg (ref 83.0–108.0)

## 2016-01-31 LAB — PROTIME-INR
INR: 1.2
Prothrombin Time: 15.3 seconds — ABNORMAL HIGH (ref 11.4–15.2)

## 2016-01-31 LAB — COMPREHENSIVE METABOLIC PANEL
ALT: 61 U/L (ref 17–63)
AST: 50 U/L — ABNORMAL HIGH (ref 15–41)
Albumin: 2.6 g/dL — ABNORMAL LOW (ref 3.5–5.0)
Alkaline Phosphatase: 74 U/L (ref 38–126)
Anion gap: 7 (ref 5–15)
BUN: 9 mg/dL (ref 6–20)
CO2: 25 mmol/L (ref 22–32)
Calcium: 8.7 mg/dL — ABNORMAL LOW (ref 8.9–10.3)
Chloride: 106 mmol/L (ref 101–111)
Creatinine, Ser: 0.8 mg/dL (ref 0.61–1.24)
GFR calc Af Amer: >60 mL/min (ref >60)
GFR calc non Af Amer: >60 mL/min (ref >60)
Glucose, Bld: 121 mg/dL — ABNORMAL HIGH (ref 65–99)
Potassium: 3.8 mmol/L (ref 3.5–5.1)
Sodium: 138 mmol/L (ref 135–145)
Total Bilirubin: 0.4 mg/dL (ref 0.3–1.2)
Total Protein: 6.6 g/dL (ref 6.5–8.1)

## 2016-01-31 LAB — CBC
HCT: 39.6 % (ref 39.0–52.0)
Hemoglobin: 12.5 g/dL — ABNORMAL LOW (ref 13.0–17.0)
MCH: 29.3 pg (ref 26.0–34.0)
MCHC: 31.6 g/dL (ref 30.0–36.0)
MCV: 92.7 fL (ref 78.0–100.0)
Platelets: 354 10*3/uL (ref 150–400)
RBC: 4.27 MIL/uL (ref 4.22–5.81)
RDW: 14.1 % (ref 11.5–15.5)
WBC: 8.8 10*3/uL (ref 4.0–10.5)

## 2016-01-31 LAB — URINALYSIS, ROUTINE W REFLEX MICROSCOPIC
Bilirubin Urine: NEGATIVE
Glucose, UA: NEGATIVE mg/dL
Hgb urine dipstick: NEGATIVE
Ketones, ur: NEGATIVE mg/dL
Leukocytes, UA: NEGATIVE
Nitrite: NEGATIVE
Protein, ur: NEGATIVE mg/dL
Specific Gravity, Urine: 1.024 (ref 1.005–1.030)
pH: 5.5 (ref 5.0–8.0)

## 2016-01-31 LAB — PREPARE RBC (CROSSMATCH)

## 2016-01-31 LAB — APTT: aPTT: 35 seconds (ref 24–36)

## 2016-01-31 LAB — ABO/RH: ABO/RH(D): O POS

## 2016-01-31 MED ORDER — METOPROLOL TARTRATE 12.5 MG HALF TABLET
12.5000 mg | ORAL_TABLET | Freq: Two times a day (BID) | ORAL | Status: DC
  Administered 2016-01-31 – 2016-02-02 (×5): 12.5 mg via ORAL
  Filled 2016-01-31 (×6): qty 1

## 2016-01-31 MED ORDER — DEXTROSE 5 % IV SOLN
1.5000 g | INTRAVENOUS | Status: AC
  Administered 2016-02-01: 1.5 g via INTRAVENOUS
  Filled 2016-01-31: qty 1.5

## 2016-01-31 NOTE — Plan of Care (Signed)
Problem: Education: Goal: Knowledge of [facility] Education information/materials will improve Outcome: Progressing Patient aware of plan of care.     

## 2016-01-31 NOTE — Care Management Note (Signed)
Case Management Note  Patient Details  Name: Roger Barnes MRN: [id number] Date of Birth: 1961/01/10  Subjective/Objective: Pt  Presented for worsening dyspnea, right-sided chest pain and weight loss. Chest x-ray showed opacification of the right lung with shift of the mediastinal to the left- Right pleural effusion. Pt post  thoracentesis 8-25 (2L) removed, 8-27 (3L) removed, and 8-30-(2L) removed. 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               Action/Plan: CM did speak with pt in regards to disposition post d/c. Plan at this time will be for home post VATS procedure scheduled for 02-01-16. CM did offer choice and pt chose [facility]. CM did make referral with Santiago Glad with [facility] and SOC to begin within 24-48 hours post d/c. No further needs from CM at this time. Unit CM will continue to monitor to see if plan changes.   Expected Discharge Date:  01/27/16               Expected Discharge Plan:  [place]  In-House Referral:  NA  Discharge planning Services  CM Consult  Post Acute Care Choice:  Home Health Choice offered to:  Patient  DME Arranged:    DME Agency:     HH Arranged:  RN [place] Agency:  [place]  Status of Service:  In process, will continue to follow  If discussed at Long Length of Stay Meetings, dates discussed:    Additional Comments:  Bethena Roys, RN 01/31/2016, 12:21 PM

## 2016-01-31 NOTE — Progress Notes (Signed)
Oncology Nurse Navigator Documentation  Oncology Nurse Navigator Flowsheets 01/31/2016  Navigator Encounter Type Other/ I received referral on Mr. Dayhoff.  He is in the hospital having surgery today.  Will follow up once discharged.  Will update Dr. Julien Nordmann on his surgery today.    Treatment Phase Pre-Tx/Tx Discussion  Barriers/Navigation Needs Coordination of Care  Interventions Coordination of Care  Coordination of Care Appts  Acuity Level 1  Time Spent with Patient 15

## 2016-01-31 NOTE — Progress Notes (Signed)
Per Dr. Julien Nordmann.  He asked that tissue obtained from today's surgery be sent to [facility] and PDL 1 testing.  I updated [facility] pathology dept.

## 2016-01-31 NOTE — Consult Note (Signed)
[address]       [place],[place] [address]             [phone]        Mariah [name] [facility] Medical Record #[id number] Date of Birth: 1960/08/09  Referring: Dr Nelda Marseille Primary Care: No PCP Per Patient  Chief Complaint:    Chief Complaint  Patient presents with  . Abdominal Pain  Recurrent right malignant effusion Adenocarcinoma lung  History of Present Illness:     Patient examined, CT scan of chest and chest x-rays and echocardiogram personally reviewed  Very nice 6 stroke Caucasian male smoker presented to Hospital earlier this week with shortness of breath and was found have a large right pleural effusion. Since admission he has had 3 separate right thoracenteses which have removed greater than a liter each time. Cytology of the fluid shows adenocarcinoma consistent with a lung primary. Echocardiogram shows no significant pericardial effusion with good LV function Brain MRI shows no evidence of metastatic disease The patient is being referred to Dr. Julien Nordmann at the cancer center for chemotherapy.  Because of the  rapid recurrent right pleural effusion Thoracic surgical evaluation was requested. After examining the patient and reviewing his studies I fee that a right VATS with chemical pleurodesis and Pleurx catheter placement would be appropriate and provide best opportunity to control this rapidly recurrent malignant pleural  effusion. Surgery will be scheduled for Friday, September 1.   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 He remained fairly active up until his recent onset of shortness of breath from the malignant pleural effusion   Zubrod Score: At the time of surgery this patient's most appropriate activity status/level should be described as: '[]'$     0    Normal activity, no symptoms '[]'$     1    Restricted in physical strenuous activity but  ambulatory, able to do out light work '[x]'$     2    Ambulatory and capable of self care, unable to do work activities, up and about                 more than 50%  Of the time                            '[]'$     3    Only limited self care, in bed greater than 50% of waking hours '[]'$     4    Completely disabled, no self care, confined to bed or chair '[]'$     5    Moribund  Past Medical History:  Diagnosis Date  . Pleural effusion, right 01/25/2016    Past Surgical History:  Procedure Laterality Date  . THORACENTESIS Right 01/25/2016  . WISDOM TOOTH EXTRACTION      History  Smoking Status  . Former Smoker  Smokeless Tobacco  . Never Used    Comment: 01/25/2016 "haven't smoked since <2007"    History  Alcohol Use No    Social History   Social History  . Marital status: Single    Spouse name: N/A  . Number of children: N/A  . Years of education: N/A   Occupational History  . Not on file.   Social History Main Topics  . Smoking status: Former Research scientist (life sciences)  . Smokeless tobacco: Never Used     Comment: 01/25/2016 "haven't smoked since <  2007"  . Alcohol use No  . Drug use: No  . Sexual activity: Not Currently   Other Topics Concern  . Not on file   Social History Narrative  . No narrative on file    Allergies  Allergen Reactions  . No Known Allergies     Current Facility-Administered Medications  Medication Dose Route Frequency Provider Last Rate Last Dose  . acetaminophen (TYLENOL) tablet 650 mg  650 mg Oral Q6H PRN Willia Craze, NP       Or  . acetaminophen (TYLENOL) suppository 650 mg  650 mg Rectal Q6H PRN Willia Craze, NP      . bisacodyl (DULCOLAX) EC tablet 5 mg  5 mg Oral Daily PRN Willia Craze, NP      . HYDROcodone-acetaminophen (NORCO/VICODIN) 5-325 MG per tablet 1-2 tablet  1-2 tablet Oral Q4H PRN Willia Craze, NP      . ketorolac (TORADOL) 30 MG/ML injection 30 mg  30 mg Intravenous Q6H PRN Erick Colace, NP      . metoprolol tartrate  (LOPRESSOR) tablet 12.5 mg  12.5 mg Oral BID Ivin Poot, MD      . ondansetron [facility]) tablet 4 mg  4 mg Oral Q6H PRN Willia Craze, NP       Or  . ondansetron [facility]) injection 4 mg  4 mg Intravenous Q6H PRN Willia Craze, NP      . polyethylene glycol (MIRALAX / GLYCOLAX) packet 17 g  17 g Oral Daily PRN Willia Craze, NP      . sodium chloride flush (NS) 0.9 % injection 3 mL  3 mL Intravenous Q12H Willia Craze, NP   3 mL at 01/30/16 2304    Prescriptions Prior to Admission  Medication Sig Dispense Refill Last Dose  . aspirin EC 81 MG tablet Take 81 mg by mouth daily.   Past Week at Unknown time  . Multiple Vitamins-Minerals (MULTIVITAMIN WITH MINERALS) tablet Take 1 tablet by mouth daily.   Past Week at Unknown time    Family History  Problem Relation Age of Onset  . Cancer Father     oral  . COPD Maternal Grandmother      Review of Systems:       Cardiac Review of Systems: Y or N  Chest Pain [  Mild right-sided pain  ]  Resting SOB [   yes] Exertional SOB  [ yes ]  Orthopnea [ yes ]   Pedal Edema [ no  ]    Palpitations [ no ] Syncope  [ no ]   Presyncope [ no  ]  General Review of Systems: [Y] = yes [  ]=no Constitional: recent weight change [ yes 10 pounds this summer ]; anorexia [  ]; fatigue Tot[place].Blacker  ]; nausea [  ]; night sweats [  ]; fever [  ]; or chills [  ]                                                               Dental: poor dentition[  ]; Last Dentist visit: Every 6-12 months  Eye : blurred vision [  ]; diplopia [   ]; vision changes [  ];  Amaurosis fugax[  ]; Resp:  cough [place].Blacker  ];  wheezing[  ];  hemoptysis[  ]; shortness of breath[ yes  ]; paroxysmal nocturnal dyspnea[  ]; dyspnea on exertion[ yes  ]; or orthopnea[ yes ];  GI:  gallstones[  ], vomiting[  ];  dysphagia[  ]; melena[  ];  hematochezia [  ]; heartburn[  ];   Hx of  Colonoscopy[  ]; GU: kidney stones [  ]; hematuria[  ];   dysuria [  ];  nocturia[  ];  history of     obstruction [   ]; urinary frequency [  ]             Skin: rash, swelling[  ];, hair loss[  ];  peripheral edema[  ];  or itching[  ]; Musculosketetal: myalgias[  ];  joint swelling[  ];  joint erythema[  ];  joint pain[ yes chronic knee pain  ];  back pain[  ];  Heme/Lymph: bruising[  ];  bleeding[  ];  anemia[  ];  Neuro: TIA[  ];  headaches[  ];  stroke[  ];  vertigo[  ];  seizures[  ];   paresthesias[  ];  difficulty walking[  ];  Psych:depression[  ]; anxiety[  ];  Endocrine: diabetes[  ];  thyroid dysfunction[  ];  Immunizations: Flu [  ]; Pneumococcal[  ];  Other: Right-hand dominant                          History of left clavicle fracture  Physical Exam: BP 112/80 (BP Location: Left Arm)   Pulse (!) 124   Temp 98.4 F (36.9 C) (Oral)   Resp 20   Ht '5\' 10"'$  (1.778 m)   Wt 174 lb 6.4 oz (79.1 kg)   SpO2 97%   BMI 25.02 kg/m        Physical Exam  General: Thin middle-aged Caucasian male anxious but in no acute distress breathing comfortably on room air HEENT: Normocephalic pupils equal , dentition adequate Neck: Supple without JVD, adenopathy, or bruit Chest: Breath sounds clear bilaterally but diminished at right base,, no rhonchi, no tenderness             or deformity Cardiovascular: Regular rate and rhythm, tachycardic but sinus, no murmur, no gallop, peripheral pulses             palpable in all extremities Abdomen:  Soft, nontender, no palpable mass or organomegaly Extremities: Warm, well-perfused, no clubbing cyanosis edema or tenderness,              no venous stasis changes of the legs Rectal/GU: Deferred Neuro: Grossly non--focal and symmetrical throughout Skin: Clean and dry without rash or ulceration   Diagnostic Studies & Laboratory data:     Recent Radiology Findings:   Dg Chest 2 View  Result Date: 01/30/2016 CLINICAL DATA:  Pleural effusion status post thoracentesis. EXAM: CHEST  2 VIEW COMPARISON:  01/29/2016, 01/27/2016 FINDINGS: Normal cardiac silhouette.  Mild expansion of the RIGHT lower lobe following thoracentesis. Minimal expansion of RIGHT upper lobe. Large RIGHT effusion remains. LEFT lung is clear knee No pneumothorax. IMPRESSION: Mild expansion of the RIGHT lower lobe following thoracentesis. No pneumothorax. Large RIGHT effusion remains. Electronically Signed   By: Suzy Bouchard M.D.   On: 01/30/2016 11:04   Mr Jeri Cos ZO Contrast  Result Date: 01/30/2016 CLINICAL DATA:  Malignant pleural effusion.  Suspected Lung cancer. EXAM: MRI HEAD WITHOUT AND WITH CONTRAST TECHNIQUE: Multiplanar, multiecho  pulse sequences of the brain and surrounding structures were obtained without and with intravenous contrast. CONTRAST:  3m MULTIHANCE GADOBENATE DIMEGLUMINE 529 MG/ML IV SOLN COMPARISON:  None. FINDINGS: No evidence for acute infarction, hemorrhage, mass lesion, hydrocephalus, or extra-axial fluid. Premature for age cerebral and cerebellar atrophy. Mild subcortical and periventricular T2 and FLAIR hyperintensities, likely chronic microvascular ischemic change. Pituitary, pineal, and cerebellar tonsils unremarkable. No upper cervical lesions. Flow voids are maintained throughout the carotid, basilar, and vertebral arteries. There are no areas of chronic hemorrhage. Post infusion, no abnormal enhancement of the brain or meninges. Visualized calvarium, skull base, and upper cervical osseous structures unremarkable. Scalp and extracranial soft tissues, orbits, sinuses, and mastoids show no acute process. IMPRESSION: Negative for intracranial metastatic disease. Mild atrophy and small vessel disease. Electronically Signed   By: JStaci RighterM.D.   On: 01/30/2016 21:42   UKoreaThoracentesis Asp Pleural Space W/img Guide  Result Date: 01/30/2016 INDICATION: Recurrent malignant right pleural effusion. Request repeat thoracentesis. EXAM: ULTRASOUND GUIDED RIGHT THORACENTESIS MEDICATIONS: None. COMPLICATIONS: None immediate. PROCEDURE: An ultrasound guided  thoracentesis was thoroughly discussed with the patient and questions answered. The benefits, risks, alternatives and complications were also discussed. The patient understands and wishes to proceed with the procedure. Written consent was obtained. Ultrasound was performed to localize and mark an adequate pocket of fluid in the right chest. The area was then prepped and draped in the normal sterile fashion. 1% Lidocaine was used for local anesthesia. Under ultrasound guidance a Safe-T-Centesis catheter was introduced. Thoracentesis was performed. The patient complained of significant discomfort and chest tightness on the right side. The procedure was stopped at this point. The catheter was removed and a dressing applied. FINDINGS: A total of approximately 2 L of dark bloody fluid was removed. Samples were sent to the laboratory as requested by the clinical team. Residual effusion remains. IMPRESSION: Successful ultrasound guided right thoracentesis yielding 2 L of pleural fluid. Read by: KAscencion DikePA-C Electronically Signed   By: JSandi MariscalM.D.   On: 01/30/2016 10:45     I have independently reviewed the above radiologic studies.  Recent Lab Findings: Lab Results  Component Value Date   WBC 7.6 01/30/2016   HGB 10.8 (L) 01/30/2016   HCT 35.6 (L) 01/30/2016   PLT 310 01/30/2016   GLUCOSE 99 01/30/2016   ALT 35 01/25/2016   AST 34 01/25/2016   NA 139 01/30/2016   K 3.9 01/30/2016   CL 103 01/30/2016   CREATININE 0.73 01/30/2016   BUN 9 01/30/2016   CO2 29 01/30/2016   TSH 7.308 (H) 01/27/2016   INR 1.52 01/25/2016      Assessment / Plan:     Large right malignant pleural effusion, rapidly recurrent after 3 thoracentesis procedures in the past week. Surgical intervention to control the effusion is indicated. Because the patient is fairly young and and in  overall good medical condition I have recommended right VATS to provide complete drainage of loculated effusion with chemical  pleurodesis and placement of a Pleurx catheter to allow drainage of a persistent effusion. I discussed the procedure indications benefits and risks in detail the patient. He'll be scheduled for surgery in a.m. on September 1.  PTharon Aquastrigt M.D. [facility] Pager [phone]

## 2016-01-31 NOTE — Progress Notes (Signed)
PROGRESS NOTE  Roger Barnes BOF:[id number] DOB: [birth year] DOA: 01/25/2016 PCP: No PCP Per Patient  Brief summary:   Roger Barnes is a 55 y.o. male with no significant PMH. Patient has not seen a healthcare provider in many years.. He takes no over-the-counter or prescription medications. Approximately 2 weeks ago patient began to get short of breath with just minimal exertion.. He has intermittent sharp pain in right scapula. No chest pain. Patient does complain of upper abdominal discomfort described as a bloated sensation. No nausea or vomiting but appetite has been suboptimal. Patient thinks he is also 5-10 pounds over the last couple of weeks. No cough . No fevers or night sweats . Chest x-ray shows large right pleural effusion. Patient has a history of smoking approximately 10 years ago.   He is found to have opacification of the right lung with large right pleural effusion with shift of the mediastinal to the left  HPI/Recap of past 24 hours:   Assessment/Plan: Active Problems:   Pleural effusion, right   Pleural effusion   Elevated d-dimer   Abnormal EKG   Acute respiratory failure with hypoxia (HCC)   Atelectasis   Adenocarcinoma, lung (HCC)  Large pleural effusion, Malignant: Adenocarcinoma lung, occupying most of the right hemithorax with mediastinum shift and liver shift to the left.  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  S/P thoracentesis 8/30,  2 L removed.  -Pulmonology and IR input appreciated.  -Dr Julien Nordmann will arrange outpatient follow up.  -CVTS consulted for evaluation for Pleur-x catheter placement.  -Appreciate Dr Prescott Gum plan for VATS with chemical pleurodesis and Pleur catheter placement.  -patient aware that he will need to follow up with Dr Julien Nordmann for tx.    Leukocytosis: from stress? Does not look septic, pleural fluids no organism seen, culture no growth so far, stop empiric abx. Wbc normalized.  AKI: cr 1.32 on admission, cr normalized with ivf. D/c ivf.    Code Status: full  Family Communication: patient.   Disposition Plan: remain in the hospital, VATS 9-01   Consultants:  Pulmonology  IR  Procedures:  Thoracentesis 8/25, 8/27  Antibiotics:  Zosyn received 2 days of treatment.    Objective: BP 121/83 (BP Location: Left Arm)   Pulse 100   Temp 98.4 F (36.9 C) (Oral)   Resp 20   Ht '5\' 10"'$  (1.778 m)   Wt 79.1 kg (174 lb 6.4 oz)   SpO2 98%   BMI 25.02 kg/m   Intake/Output Summary (Last 24 hours) at 01/31/16 1552 Last data filed at 01/31/16 1344  Gross per 24 hour  Intake             1440 ml  Output              950 ml  Net              490 ml   Filed Weights   01/28/16 0534 01/29/16 0517 01/30/16 0635  Weight: 77.5 kg (170 lb 14.4 oz) 79.2 kg (174 lb 8 oz) 79.1 kg (174 lb 6.4 oz)    Exam:   General:  NAD, thin  Cardiovascular: less sinus tachycardia  Respiratory: diminished breath sounds on right, clear on left  Abdomen: Soft/ND/NT, positive BS  Musculoskeletal: No Edema  Neuro: aaox3  Data Reviewed: Basic Metabolic Panel:  Recent Labs Lab 01/27/16  3646 01/28/16 0508 01/29/16 0357 01/30/16 0335 01/31/16 1235  NA 139 136 137 139 138  K 3.8 3.5 3.6 3.9 3.8  CL 103 102 103 103 106  CO2 '28 26 29 29 25  '$ GLUCOSE 93 89 117* 99 121*  BUN '17 11 9 9 9  '$ CREATININE 1.09 0.84 0.70 0.73 0.80  CALCIUM 8.5* 8.3* 8.0* 8.3* 8.7*  MG 2.4  --  2.2  --   --    Liver Function Tests:  Recent Labs Lab 01/25/16 0755 01/31/16 1235  AST 34 50*  ALT 35 61  ALKPHOS 88 74  BILITOT 0.9 0.4  PROT 7.7 6.6  ALBUMIN 3.1* 2.6*    Recent Labs Lab 01/25/16 0755  LIPASE 51   No results for input(s): AMMONIA in the last 168 hours. CBC:  Recent Labs Lab 01/26/16 0539 01/27/16 0525  01/28/16 0508 01/30/16 0335 01/31/16 1235  WBC 8.5 8.5 7.5 7.6 8.8  HGB 10.8* 11.6* 11.3* 10.8* 12.5*  HCT 33.9* 36.8* 35.8* 35.6* 39.6  MCV 91.6 93.9 92.3 93.4 92.7  PLT 343 366 333 310 354   Cardiac Enzymes:    Recent Labs Lab 01/25/16 1153 01/25/16 1755 01/25/16 2306  TROPONINI <0.03 <0.03 <0.03   BNP (last 3 results) No results for input(s): BNP in the last 8760 hours.  ProBNP (last 3 results) No results for input(s): PROBNP in the last 8760 hours.  CBG: No results for input(s): GLUCAP in the last 168 hours.  Recent Results (from the past 240 hour(s))  Culture, body fluid-bottle     Status: None   Collection Time: 01/25/16 12:44 PM  Result Value Ref Range Status   Specimen Description FLUID PLEURAL  Final   Special Requests NONE  Final   Culture NO GROWTH 5 DAYS  Final   Report Status 01/30/2016 FINAL  Final  Gram stain     Status: None   Collection Time: 01/25/16 12:44 PM  Result Value Ref Range Status   Specimen Description FLUID PLEURAL  Final   Special Requests NONE  Final   Gram Stain   Final    FEW WBC PRESENT,BOTH PMN AND MONONUCLEAR NO ORGANISMS SEEN    Report Status 01/25/2016 FINAL  Final  MRSA PCR Screening     Status: None   Collection Time: 01/25/16  3:25 PM  Result Value Ref Range Status   MRSA by PCR NEGATIVE NEGATIVE Final    Comment:        The GeneXpert MRSA Assay (FDA approved for NASAL specimens only), is one component of a comprehensive MRSA colonization surveillance program. It is not intended to diagnose MRSA infection nor to guide or monitor treatment for MRSA infections.   Culture, body fluid-bottle     Status: None (Preliminary result)   Collection Time: 01/30/16 10:27 AM  Result Value Ref Range Status   Specimen Description FLUID RIGHT PLEURAL  Final   Special Requests NONE  Final   Culture NO GROWTH 1 DAY  Final   Report Status PENDING  Incomplete  Gram stain     Status: None   Collection Time: 01/30/16 10:27 AM   Result Value Ref Range Status   Specimen Description FLUID RIGHT PLEURAL  Final   Special Requests NONE  Final   Gram Stain   Final    ABUNDANT WBC PRESENT,BOTH PMN AND MONONUCLEAR NO ORGANISMS SEEN    Report Status 01/30/2016 FINAL  Final     Studies: Mr Jeri Cos OE Contrast  Result Date: 01/30/2016  CLINICAL DATA:  Malignant pleural effusion.  Suspected Lung cancer. EXAM: MRI HEAD WITHOUT AND WITH CONTRAST TECHNIQUE: Multiplanar, multiecho pulse sequences of the brain and surrounding structures were obtained without and with intravenous contrast. CONTRAST:  51m MULTIHANCE GADOBENATE DIMEGLUMINE 529 MG/ML IV SOLN COMPARISON:  None. FINDINGS: No evidence for acute infarction, hemorrhage, mass lesion, hydrocephalus, or extra-axial fluid. Premature for age cerebral and cerebellar atrophy. Mild subcortical and periventricular T2 and FLAIR hyperintensities, likely chronic microvascular ischemic change. Pituitary, pineal, and cerebellar tonsils unremarkable. No upper cervical lesions. Flow voids are maintained throughout the carotid, basilar, and vertebral arteries. There are no areas of chronic hemorrhage. Post infusion, no abnormal enhancement of the brain or meninges. Visualized calvarium, skull base, and upper cervical osseous structures unremarkable. Scalp and extracranial soft tissues, orbits, sinuses, and mastoids show no acute process. IMPRESSION: Negative for intracranial metastatic disease. Mild atrophy and small vessel disease. Electronically Signed   By: JStaci RighterM.D.   On: 01/30/2016 21:42    Scheduled Meds: . [START ON 02/01/2016] cefUROXime (ZINACEF)  IV  1.5 g Intravenous 60 min Pre-Op  . metoprolol tartrate  12.5 mg Oral BID  . sodium chloride flush  3 mL Intravenous Q12H    Continuous Infusions:     Time spent: 250ms  ReElmarie ShileyD [facility] Pager [phone] 7PM-7AM, please contact night-coverage at [email or web address], password [email or web address]/31/2017, 3:52 PM   LOS: 5 days

## 2016-02-01 ENCOUNTER — Inpatient Hospital Stay (HOSPITAL_COMMUNITY): Payer: Medicare HMO

## 2016-02-01 ENCOUNTER — Inpatient Hospital Stay (HOSPITAL_COMMUNITY): Payer: Medicare HMO | Admitting: Anesthesiology

## 2016-02-01 ENCOUNTER — Encounter (HOSPITAL_COMMUNITY)
Admission: EM | Disposition: A | Discharge status: Home Health | Payer: Self-pay | Source: Home / Self Care | Attending: Internal Medicine

## 2016-02-01 ENCOUNTER — Encounter (HOSPITAL_COMMUNITY): Payer: Self-pay | Admitting: Certified Registered Nurse Anesthetist

## 2016-02-01 DIAGNOSIS — J910 Malignant pleural effusion: Secondary | ICD-10-CM

## 2016-02-01 DIAGNOSIS — C3491 Malignant neoplasm of unspecified part of right bronchus or lung: Secondary | ICD-10-CM

## 2016-02-01 HISTORY — PX: PLEURADESIS: SHX6030

## 2016-02-01 HISTORY — PX: VIDEO ASSISTED THORACOSCOPY: SHX5073

## 2016-02-01 HISTORY — PX: VIDEO BRONCHOSCOPY: SHX5072

## 2016-02-01 HISTORY — PX: CHEST TUBE INSERTION: SHX231

## 2016-02-01 LAB — GRAM STAIN

## 2016-02-01 SURGERY — VIDEO ASSISTED THORACOSCOPY
Anesthesia: General | Site: Chest | Laterality: Right

## 2016-02-01 MED ORDER — ONDANSETRON HCL 4 MG/2ML IJ SOLN: 4.0000 mg | Freq: Four times a day (QID) | INTRAMUSCULAR | Status: DC | PRN

## 2016-02-01 MED ORDER — PROPOFOL 10 MG/ML IV BOLUS
INTRAVENOUS | Status: DC | PRN
  Administered 2016-02-01: 130 mg via INTRAVENOUS

## 2016-02-01 MED ORDER — ACETAMINOPHEN 160 MG/5ML PO SOLN
1000.0000 mg | Freq: Four times a day (QID) | ORAL | Status: DC
  Administered 2016-02-02: 1000 mg via ORAL
  Filled 2016-02-01 (×2): qty 40.6

## 2016-02-01 MED ORDER — SENNOSIDES-DOCUSATE SODIUM 8.6-50 MG PO TABS
1.0000 | ORAL_TABLET | Freq: Every day | ORAL | Status: DC
  Administered 2016-02-02 – 2016-02-04 (×3): 1 via ORAL
  Filled 2016-02-01 (×4): qty 1

## 2016-02-01 MED ORDER — LACTATED RINGERS IV SOLN
INTRAVENOUS | Status: DC | PRN
  Administered 2016-02-01: 07:00:00 via INTRAVENOUS

## 2016-02-01 MED ORDER — MIDAZOLAM HCL 2 MG/2ML IJ SOLN
INTRAMUSCULAR | Status: AC
  Filled 2016-02-01: qty 2

## 2016-02-01 MED ORDER — MIDAZOLAM HCL 5 MG/5ML IJ SOLN
INTRAMUSCULAR | Status: DC | PRN
  Administered 2016-02-01: 2 mg via INTRAVENOUS

## 2016-02-01 MED ORDER — HEMOSTATIC AGENTS (NO CHARGE) OPTIME
TOPICAL | Status: DC | PRN
  Administered 2016-02-01: 1 via TOPICAL

## 2016-02-01 MED ORDER — LIDOCAINE HCL (CARDIAC) 20 MG/ML IV SOLN
INTRAVENOUS | Status: DC | PRN
  Administered 2016-02-01: 100 mg via INTRAVENOUS

## 2016-02-01 MED ORDER — DIPHENHYDRAMINE HCL 50 MG/ML IJ SOLN: 12.5000 mg | Freq: Four times a day (QID) | INTRAMUSCULAR | Status: DC | PRN

## 2016-02-01 MED ORDER — FENTANYL CITRATE (PF) 250 MCG/5ML IJ SOLN
INTRAMUSCULAR | Status: AC
  Filled 2016-02-01: qty 5

## 2016-02-01 MED ORDER — TRAMADOL HCL 50 MG PO TABS
50.0000 mg | ORAL_TABLET | Freq: Four times a day (QID) | ORAL | Status: DC | PRN
  Administered 2016-02-04: 50 mg via ORAL
  Filled 2016-02-01: qty 1

## 2016-02-01 MED ORDER — OXYCODONE HCL 5 MG PO TABS
5.0000 mg | ORAL_TABLET | ORAL | Status: DC | PRN
  Administered 2016-02-05: 5 mg via ORAL
  Filled 2016-02-01: qty 1

## 2016-02-01 MED ORDER — FENTANYL CITRATE (PF) 100 MCG/2ML IJ SOLN
INTRAMUSCULAR | Status: DC | PRN
  Administered 2016-02-01 (×4): 50 ug via INTRAVENOUS
  Administered 2016-02-01: 100 ug via INTRAVENOUS
  Administered 2016-02-01 (×2): 50 ug via INTRAVENOUS

## 2016-02-01 MED ORDER — LEVALBUTEROL HCL 0.63 MG/3ML IN NEBU
0.6300 mg | INHALATION_SOLUTION | Freq: Four times a day (QID) | RESPIRATORY_TRACT | Status: DC
Start: 2016-02-01 — End: 2016-02-02
  Administered 2016-02-01 – 2016-02-02 (×6): 0.63 mg via RESPIRATORY_TRACT
  Filled 2016-02-01 (×6): qty 3

## 2016-02-01 MED ORDER — NALOXONE HCL 0.4 MG/ML IJ SOLN: 0.4000 mg | INTRAMUSCULAR | Status: DC | PRN

## 2016-02-01 MED ORDER — SODIUM CHLORIDE 0.9% FLUSH: 9.0000 mL | INTRAVENOUS | Status: DC | PRN

## 2016-02-01 MED ORDER — ACETAMINOPHEN 500 MG PO TABS
1000.0000 mg | ORAL_TABLET | Freq: Four times a day (QID) | ORAL | Status: DC
  Administered 2016-02-01 – 2016-02-05 (×10): 1000 mg via ORAL
  Filled 2016-02-01 (×12): qty 2

## 2016-02-01 MED ORDER — FENTANYL 40 MCG/ML IV SOLN
INTRAVENOUS | Status: DC
  Administered 2016-02-01: 11:00:00 via INTRAVENOUS
  Administered 2016-02-01: 180 ug via INTRAVENOUS
  Administered 2016-02-01: 90 ug via INTRAVENOUS
  Administered 2016-02-01: 150 ug via INTRAVENOUS
  Administered 2016-02-02: 195 ug via INTRAVENOUS
  Administered 2016-02-02: 135 ug via INTRAVENOUS
  Administered 2016-02-02: 165 ug via INTRAVENOUS
  Administered 2016-02-02: 135 ug via INTRAVENOUS
  Administered 2016-02-02: 180 ug via INTRAVENOUS
  Administered 2016-02-02: 40 ug via INTRAVENOUS
  Administered 2016-02-03: 75 ug via INTRAVENOUS
  Administered 2016-02-03: 90 ug via INTRAVENOUS
  Administered 2016-02-03: 105 ug via INTRAVENOUS
  Administered 2016-02-03: 30 ug via INTRAVENOUS
  Filled 2016-02-01 (×2): qty 25

## 2016-02-01 MED ORDER — SUGAMMADEX SODIUM 200 MG/2ML IV SOLN
INTRAVENOUS | Status: DC | PRN
  Administered 2016-02-01: 200 mg via INTRAVENOUS

## 2016-02-01 MED ORDER — ROCURONIUM BROMIDE 100 MG/10ML IV SOLN
INTRAVENOUS | Status: DC | PRN
  Administered 2016-02-01: 10 mg via INTRAVENOUS
  Administered 2016-02-01: 60 mg via INTRAVENOUS
  Administered 2016-02-01: 10 mg via INTRAVENOUS

## 2016-02-01 MED ORDER — OXYCODONE HCL 5 MG/5ML PO SOLN: 5.0000 mg | Freq: Once | ORAL | Status: DC | PRN

## 2016-02-01 MED ORDER — ONDANSETRON HCL 4 MG/2ML IJ SOLN: 4.0000 mg | Freq: Once | INTRAMUSCULAR | Status: DC | PRN

## 2016-02-01 MED ORDER — HYDROMORPHONE HCL 1 MG/ML IJ SOLN
0.2500 mg | INTRAMUSCULAR | Status: DC | PRN
  Administered 2016-02-01 (×2): 0.5 mg via INTRAVENOUS

## 2016-02-01 MED ORDER — BISACODYL 5 MG PO TBEC
10.0000 mg | DELAYED_RELEASE_TABLET | Freq: Every day | ORAL | Status: DC
  Administered 2016-02-02 – 2016-02-04 (×3): 10 mg via ORAL
  Filled 2016-02-01 (×3): qty 2

## 2016-02-01 MED ORDER — OXYCODONE HCL 5 MG PO TABS: 5.0000 mg | ORAL_TABLET | Freq: Once | ORAL | Status: DC | PRN

## 2016-02-01 MED ORDER — POTASSIUM CHLORIDE 10 MEQ/50ML IV SOLN: 10.0000 meq | Freq: Every day | INTRAVENOUS | Status: DC | PRN

## 2016-02-01 MED ORDER — ONDANSETRON HCL 4 MG/2ML IJ SOLN
INTRAMUSCULAR | Status: DC | PRN
  Administered 2016-02-01: 4 mg via INTRAVENOUS

## 2016-02-01 MED ORDER — SODIUM CHLORIDE 0.9 % IV SOLN
500.0000 mg | Freq: Once | INTRAVENOUS | Status: AC
  Administered 2016-02-01: 500 mg via INTRAPLEURAL
  Filled 2016-02-01: qty 500

## 2016-02-01 MED ORDER — POTASSIUM CHLORIDE IN NACL 20-0.9 MEQ/L-% IV SOLN
INTRAVENOUS | Status: DC
  Administered 2016-02-01 – 2016-02-02 (×2): via INTRAVENOUS
  Filled 2016-02-01 (×3): qty 1000

## 2016-02-01 MED ORDER — METOCLOPRAMIDE HCL 5 MG/ML IJ SOLN
10.0000 mg | Freq: Four times a day (QID) | INTRAMUSCULAR | Status: AC
  Administered 2016-02-01 – 2016-02-02 (×4): 10 mg via INTRAVENOUS
  Filled 2016-02-01 (×4): qty 2

## 2016-02-01 MED ORDER — DEXTROSE 5 % IV SOLN
1.5000 g | Freq: Two times a day (BID) | INTRAVENOUS | Status: AC
  Administered 2016-02-01 – 2016-02-02 (×2): 1.5 g via INTRAVENOUS
  Filled 2016-02-01 (×2): qty 1.5

## 2016-02-01 MED ORDER — DIPHENHYDRAMINE HCL 12.5 MG/5ML PO ELIX: 12.5000 mg | ORAL_SOLUTION | Freq: Four times a day (QID) | ORAL | Status: DC | PRN

## 2016-02-01 MED ORDER — HYDROMORPHONE HCL 1 MG/ML IJ SOLN
INTRAMUSCULAR | Status: AC
  Administered 2016-02-01: 0.5 mg via INTRAVENOUS
  Filled 2016-02-01: qty 1

## 2016-02-01 MED ORDER — PHENYLEPHRINE HCL 10 MG/ML IJ SOLN
INTRAVENOUS | Status: DC | PRN
  Administered 2016-02-01: 20 ug/min via INTRAVENOUS

## 2016-02-01 MED ORDER — FENTANYL 40 MCG/ML IV SOLN
INTRAVENOUS | Status: AC
  Administered 2016-02-02: 40 ug via INTRAVENOUS
  Filled 2016-02-01: qty 25

## 2016-02-01 MED ORDER — MEPERIDINE HCL 25 MG/ML IJ SOLN: 6.2500 mg | INTRAMUSCULAR | Status: DC | PRN

## 2016-02-01 MED ORDER — 0.9 % SODIUM CHLORIDE (POUR BTL) OPTIME
TOPICAL | Status: DC | PRN
  Administered 2016-02-01: 1000 mL

## 2016-02-01 MED ORDER — PROPOFOL 10 MG/ML IV BOLUS
INTRAVENOUS | Status: AC
  Filled 2016-02-01: qty 20

## 2016-02-01 SURGICAL SUPPLY — 85 items
APPLICATOR TIP EXT COSEAL (VASCULAR PRODUCTS) ×5 IMPLANT
BAG DECANTER FOR FLEXI CONT (MISCELLANEOUS) IMPLANT
BLADE SURG 11 STRL SS (BLADE) ×5 IMPLANT
BRUSH CYTOL CELLEBRITY 1.5X140 (MISCELLANEOUS) IMPLANT
CANISTER SUCTION 2500CC (MISCELLANEOUS) ×15 IMPLANT
CATH KIT ON Q 5IN SLV (PAIN MANAGEMENT) IMPLANT
CATH ROBINSON RED A/P 22FR (CATHETERS) IMPLANT
CATH THORACIC 28FR (CATHETERS) ×5 IMPLANT
CATH THORACIC 36FR (CATHETERS) IMPLANT
CATH THORACIC 36FR RT ANG (CATHETERS) IMPLANT
CONT SPEC 4OZ CLIKSEAL STRL BL (MISCELLANEOUS) ×20 IMPLANT
COTTONBALL LRG STERILE PKG (GAUZE/BANDAGES/DRESSINGS) ×5 IMPLANT
COVER SURGICAL LIGHT HANDLE (MISCELLANEOUS) ×15 IMPLANT
COVER TABLE BACK 60X90 (DRAPES) ×5 IMPLANT
DERMABOND ADVANCED (GAUZE/BANDAGES/DRESSINGS) ×2
DERMABOND ADVANCED .7 DNX12 (GAUZE/BANDAGES/DRESSINGS) ×3 IMPLANT
DRAPE C-ARM 42X72 X-RAY (DRAPES) IMPLANT
DRAPE LAPAROSCOPIC ABDOMINAL (DRAPES) ×10 IMPLANT
DRAPE SLUSH/WARMER DISC (DRAPES) ×5 IMPLANT
DRAPE WARM FLUID 44X44 (DRAPE) IMPLANT
ELECT REM PT RETURN 9FT ADLT (ELECTROSURGICAL) ×5
ELECTRODE REM PT RTRN 9FT ADLT (ELECTROSURGICAL) ×3 IMPLANT
FORCEPS BIOP RJ4 1.8 (CUTTING FORCEPS) IMPLANT
GAUZE SPONGE 4X4 12PLY STRL (GAUZE/BANDAGES/DRESSINGS) ×10 IMPLANT
GLOVE BIO SURGEON STRL SZ7.5 (GLOVE) ×10 IMPLANT
GLOVE BIOGEL PI IND STRL 6.5 (GLOVE) ×6 IMPLANT
GLOVE BIOGEL PI INDICATOR 6.5 (GLOVE) ×4
GOWN STRL REUS W/ TWL LRG LVL3 (GOWN DISPOSABLE) ×15 IMPLANT
GOWN STRL REUS W/TWL LRG LVL3 (GOWN DISPOSABLE) ×10
KIT BASIN OR (CUSTOM PROCEDURE TRAY) ×10 IMPLANT
KIT CLEAN ENDO COMPLIANCE (KITS) ×5 IMPLANT
KIT PLEURX DRAIN CATH 1000ML (MISCELLANEOUS) ×5 IMPLANT
KIT PLEURX DRAIN CATH 15.5FR (DRAIN) ×5 IMPLANT
KIT ROOM TURNOVER OR (KITS) ×10 IMPLANT
KIT SUCTION CATH 14FR (SUCTIONS) ×5 IMPLANT
MARKER SKIN DUAL TIP RULER LAB (MISCELLANEOUS) ×5 IMPLANT
NEEDLE BIOPSY TRANSBRONCH 21G (NEEDLE) IMPLANT
NEEDLE BLUNT 18X1 FOR OR ONLY (NEEDLE) IMPLANT
NEEDLE HYPO 22GX1.5 SAFETY (NEEDLE) IMPLANT
NEEDLE HYPO 25GX1X1/2 BEV (NEEDLE) ×5 IMPLANT
NS IRRIG 1000ML POUR BTL (IV SOLUTION) ×15 IMPLANT
OIL SILICONE PENTAX (PARTS (SERVICE/REPAIRS)) ×5 IMPLANT
PACK CHEST (CUSTOM PROCEDURE TRAY) ×5 IMPLANT
PACK GENERAL/GYN (CUSTOM PROCEDURE TRAY) ×5 IMPLANT
PAD ARMBOARD 7.5X6 YLW CONV (MISCELLANEOUS) ×20 IMPLANT
SEALANT SURG COSEAL 4ML (VASCULAR PRODUCTS) ×5 IMPLANT
SET DRAINAGE LINE (MISCELLANEOUS) IMPLANT
SOLUTION ANTI FOG 6CC (MISCELLANEOUS) ×5 IMPLANT
SPONGE GAUZE 4X4 12PLY STER LF (GAUZE/BANDAGES/DRESSINGS) ×5 IMPLANT
SPONGE TONSIL 1 RF SGL (DISPOSABLE) ×5 IMPLANT
SUT CHROMIC 3 0 SH 27 (SUTURE) IMPLANT
SUT ETHILON 3 0 PS 1 (SUTURE) ×10 IMPLANT
SUT PROLENE 3 0 SH DA (SUTURE) IMPLANT
SUT PROLENE 4 0 RB 1 (SUTURE)
SUT PROLENE 4-0 RB1 .5 CRCL 36 (SUTURE) IMPLANT
SUT SILK  1 MH (SUTURE) ×4
SUT SILK 1 MH (SUTURE) ×6 IMPLANT
SUT SILK 2 0 SH (SUTURE) ×5 IMPLANT
SUT SILK 2 0SH CR/8 30 (SUTURE) ×5 IMPLANT
SUT SILK 3 0SH CR/8 30 (SUTURE) IMPLANT
SUT VIC AB 1 CTX 18 (SUTURE) ×5 IMPLANT
SUT VIC AB 2 TP1 27 (SUTURE) ×5 IMPLANT
SUT VIC AB 2-0 CT2 18 VCP726D (SUTURE) IMPLANT
SUT VIC AB 2-0 CTX 36 (SUTURE) ×5 IMPLANT
SUT VIC AB 3-0 SH 18 (SUTURE) IMPLANT
SUT VIC AB 3-0 SH 8-18 (SUTURE) ×5 IMPLANT
SUT VIC AB 3-0 X1 27 (SUTURE) ×5 IMPLANT
SUT VICRYL 0 UR6 27IN ABS (SUTURE) IMPLANT
SUT VICRYL 2 TP 1 (SUTURE) IMPLANT
SWAB COLLECTION DEVICE MRSA (MISCELLANEOUS) IMPLANT
SYR 20ML ECCENTRIC (SYRINGE) ×5 IMPLANT
SYR 5ML LUER SLIP (SYRINGE) ×5 IMPLANT
SYR CONTROL 10ML LL (SYRINGE) ×5 IMPLANT
SYSTEM SAHARA CHEST DRAIN ATS (WOUND CARE) ×5 IMPLANT
TAPE CLOTH SURG 4X10 WHT LF (GAUZE/BANDAGES/DRESSINGS) ×5 IMPLANT
TIP APPLICATOR SPRAY EXTEND 16 (VASCULAR PRODUCTS) IMPLANT
TOWEL OR 17X24 6PK STRL BLUE (TOWEL DISPOSABLE) ×10 IMPLANT
TOWEL OR 17X26 10 PK STRL BLUE (TOWEL DISPOSABLE) ×15 IMPLANT
TRAP SPECIMEN MUCOUS 40CC (MISCELLANEOUS) ×5 IMPLANT
TRAY FOLEY CATH 16FRSI W/METER (SET/KITS/TRAYS/PACK) ×5 IMPLANT
TUBE ANAEROBIC SPECIMEN COL (MISCELLANEOUS) IMPLANT
TUBE CONNECTING 20'X1/4 (TUBING) ×2
TUBE CONNECTING 20X1/4 (TUBING) ×8 IMPLANT
VALVE REPLACEMENT CAP (MISCELLANEOUS) IMPLANT
WATER STERILE IRR 1000ML POUR (IV SOLUTION) ×15 IMPLANT

## 2016-02-01 NOTE — Progress Notes (Signed)
The patient was examined and preop studies reviewed. There has been no change from the prior exam and the patient is ready for surgery.  plan bronch, R VATS R pleurx cartheter on [facility]

## 2016-02-01 NOTE — Brief Op Note (Addendum)
01/25/2016 - 02/01/2016  9:37 AM  PATIENT:  Roger Barnes  55 y.o. male  PRE-OPERATIVE DIAGNOSIS:  RIGHT MALIGNANT PLEURAL EFFUSION  POST-OPERATIVE DIAGNOSIS:  RIGHT MALIGNANT PLEURAL EFFUSION  PROCEDURE:  Procedure(s):  VIDEO ASSISTED THORACOSCOPY -Drainage of Pleural Effusion about 3L -Pleural Biopsies - chemical pleurodesis -pleurx catheter placed- right  INSERTION PLEURAL DRAINAGE CATHETER (Right)  DOXYCYCLINE PLEURADESIS (Right)  VIDEO BRONCHOSCOPY (N/A)   washinmgs of RLL cytology SURGEON:  Surgeon(s) and Role:    * Ivin Poot, MD - Primary  PHYSICIAN ASSISTANT: Ellwood Handler PA-C  ANESTHESIA:   general  EBL:  Total I/O In: -  Out: 125 [Urine:125]  BLOOD ADMINISTERED:none  DRAINS: 28 Straight Chest Tube   LOCAL MEDICATIONS USED:  NONE  SPECIMEN:  Source of Specimen:  Pleural Fluid, Pleural Biopsies  DISPOSITION OF SPECIMEN:  PATHOLOGY  COUNTS:  YES  TOURNIQUET:  * No tourniquets in log *  DICTATION: .Dragon Dictation  PLAN OF CARE: Admit to inpatient   PATIENT DISPOSITION:  PACU - hemodynamically stable.   Delay start of Pharmacological VTE agent (>24hrs) due to surgical blood loss or risk of bleeding: yes

## 2016-02-01 NOTE — Anesthesia Procedure Notes (Signed)
Procedure Name: Intubation Date/Time: 02/01/2016 8:04 AM Performed by: Shirlyn Goltz Intubation Type: Inhalational induction with existing ETT Grade View: Grade II Endobronchial tube: Left, Double lumen EBT, EBT position confirmed by auscultation and EBT position confirmed by fiberoptic bronchoscope and 39 Fr Number of attempts: 1 Airway Equipment and Method: Stylet Placement Confirmation: ETT inserted through vocal cords under direct vision,  positive ETCO2 and breath sounds checked- equal and bilateral Tube secured with: Tape Dental Injury: Injury to lip

## 2016-02-01 NOTE — Anesthesia Postprocedure Evaluation (Signed)
Anesthesia Post Note  Patient: Roger Barnes  Procedure(s) Performed: Procedure(s) (LRB): VIDEO ASSISTED THORACOSCOPY (Right) INSERTION PLEURAL DRAINAGE CATHETER (Right) DOXYCYCLINE PLEURADESIS (Right) VIDEO BRONCHOSCOPY (N/A)  Patient location during evaluation: PACU Anesthesia Type: General Level of consciousness: awake and alert Pain management: pain level controlled Vital Signs Assessment: post-procedure vital signs reviewed and stable Respiratory status: spontaneous breathing, nonlabored ventilation and respiratory function stable Cardiovascular status: blood pressure returned to baseline and stable Postop Assessment: no signs of nausea or vomiting Anesthetic complications: no    Last Vitals:  Vitals:   02/01/16 0554 02/01/16 1007  BP: 131/86   Pulse: (!) 105   Resp: 18   Temp: 37 C 36.5 C    Last Pain:  Vitals:   02/01/16 0554  TempSrc: Oral  PainSc:                  Charina Fons A

## 2016-02-01 NOTE — Progress Notes (Signed)
PROGRESS NOTE  Roger Barnes FOY:[id number] DOB: 01-Sep-1960 DOA: 01/25/2016 PCP: No PCP Per Patient  Brief summary:   Roger Barnes is a 55 y.o. male with no significant PMH. Patient has not seen a healthcare provider in many years.. He takes no over-the-counter or prescription medications. Approximately 2 weeks ago patient began to get short of breath with just minimal exertion.. He has intermittent sharp pain in right scapula. No chest pain. Patient does complain of upper abdominal discomfort described as a bloated sensation. No nausea or vomiting but appetite has been suboptimal. Patient thinks he is also 5-10 pounds over the last couple of weeks. No cough . No fevers or night sweats . Chest x-ray shows large right pleural effusion. Patient has a history of smoking approximately 10 years ago.   He is found to have opacification of the right lung with large right pleural effusion with shift of the mediastinal to the left  HPI/Recap of past 24 hours: He is doing ok, post procedure, denies worsening dyspnea.  He does relates pain at site on  of chest tube.   Assessment/Plan: Active Problems:   Pleural effusion, right   Pleural effusion   Elevated d-dimer   Abnormal EKG   Acute respiratory failure with hypoxia (HCC)   Atelectasis   Adenocarcinoma, lung (HCC)  Large pleural effusion, Malignant: Adenocarcinoma lung, occupying most of the right hemithorax with mediastinum shift and liver shift to the left.  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  S/P thoracentesis 8/30,  2 L removed.  -Pulmonology and IR input appreciated.  -Dr Julien Nordmann will arrange outpatient follow up.  -CVTS consulted for evaluation for Pleur-x catheter placement.  -Appreciate Dr Prescott Gum. Patient S/P  VATS with chemical pleurodesis  and Pleur catheter placement.  Pneumothorax 50 %,  Management per CVTS.  -patient aware that he will need to follow up with Dr Julien Nordmann for tx.   Leukocytosis: from stress? Does not look septic, pleural fluids no organism seen, culture no growth so far, stop empiric abx. Wbc normalized.  AKI: cr 1.32 on admission, cr normalized with ivf. D/c ivf.    Code Status: full  Family Communication: patient.   Disposition Plan: remain in the hospital, VATS 9-01   Consultants:  Pulmonology  IR  Procedures:  Thoracentesis 8/25, 8/27  Antibiotics:  Zosyn received 2 days of treatment.    Objective: BP 113/84   Pulse 92   Temp 97.8 F (36.6 C)   Resp 15   Ht '5\' 10"'$  (1.778 m)   Wt 76.4 kg (168 lb 6.4 oz)   SpO2 98%   BMI 24.16 kg/m   Intake/Output Summary (Last 24 hours) at 02/01/16 1318 Last data filed at 02/01/16 1228  Gross per 24 hour  Intake             2060 ml  Output             1475 ml  Net              585 ml   Filed Weights   01/29/16 0517 01/30/16 0635 02/01/16 0554  Weight: 79.2 kg (174 lb 8 oz) 79.1 kg (174 lb 6.4 oz) 76.4 kg (168 lb 6.4 oz)    Exam:   General:  NAD, thin  Cardiovascular: less sinus tachycardia  Respiratory: diminished breath sounds on right, clear on  left  Abdomen: Soft/ND/NT, positive BS  Musculoskeletal: No Edema  Neuro: aaox3  Data Reviewed: Basic Metabolic Panel:  Recent Labs Lab 01/27/16 0525 01/28/16 0508 01/29/16 0357 01/30/16 0335 01/31/16 1235  NA 139 136 137 139 138  K 3.8 3.5 3.6 3.9 3.8  CL 103 102 103 103 106  CO2 '28 26 29 29 25  '$ GLUCOSE 93 89 117* 99 121*  BUN '17 11 9 9 9  '$ CREATININE 1.09 0.84 0.70 0.73 0.80  CALCIUM 8.5* 8.3* 8.0* 8.3* 8.7*  MG 2.4  --  2.2  --   --    Liver Function Tests:  Recent Labs Lab 01/31/16 1235  AST 50*  ALT 61  ALKPHOS 74  BILITOT 0.4  PROT 6.6  ALBUMIN 2.6*   No results for input(s): LIPASE, AMYLASE in the last 168 hours. No results for input(s): AMMONIA in  the last 168 hours. CBC:  Recent Labs Lab 01/26/16 0539 01/27/16 0525 01/28/16 0508 01/30/16 0335 01/31/16 1235  WBC 8.5 8.5 7.5 7.6 8.8  HGB 10.8* 11.6* 11.3* 10.8* 12.5*  HCT 33.9* 36.8* 35.8* 35.6* 39.6  MCV 91.6 93.9 92.3 93.4 92.7  PLT 343 366 333 310 354   Cardiac Enzymes:    Recent Labs Lab 01/25/16 1755 01/25/16 2306  TROPONINI <0.03 <0.03   BNP (last 3 results) No results for input(s): BNP in the last 8760 hours.  ProBNP (last 3 results) No results for input(s): PROBNP in the last 8760 hours.  CBG: No results for input(s): GLUCAP in the last 168 hours.  Recent Results (from the past 240 hour(s))  Culture, body fluid-bottle     Status: None   Collection Time: 01/25/16 12:44 PM  Result Value Ref Range Status   Specimen Description FLUID PLEURAL  Final   Special Requests NONE  Final   Culture NO GROWTH 5 DAYS  Final   Report Status 01/30/2016 FINAL  Final  Gram stain     Status: None   Collection Time: 01/25/16 12:44 PM  Result Value Ref Range Status   Specimen Description FLUID PLEURAL  Final   Special Requests NONE  Final   Gram Stain   Final    FEW WBC PRESENT,BOTH PMN AND MONONUCLEAR NO ORGANISMS SEEN    Report Status 01/25/2016 FINAL  Final  MRSA PCR Screening     Status: None   Collection Time: 01/25/16  3:25 PM  Result Value Ref Range Status   MRSA by PCR NEGATIVE NEGATIVE Final    Comment:        The GeneXpert MRSA Assay (FDA approved for NASAL specimens only), is one component of a comprehensive MRSA colonization surveillance program. It is not intended to diagnose MRSA infection nor to guide or monitor treatment for MRSA infections.   Culture, body fluid-bottle     Status: None (Preliminary result)   Collection Time: 01/30/16 10:27 AM  Result Value Ref Range Status   Specimen Description FLUID RIGHT PLEURAL  Final   Special Requests NONE  Final   Culture NO GROWTH 2 DAYS  Final   Report Status PENDING  Incomplete  Gram stain      Status: None   Collection Time: 01/30/16 10:27 AM  Result Value Ref Range Status   Specimen Description FLUID RIGHT PLEURAL  Final   Special Requests NONE  Final   Gram Stain   Final    ABUNDANT WBC PRESENT,BOTH PMN AND MONONUCLEAR NO ORGANISMS SEEN    Report Status 01/30/2016 FINAL  Final  Studies: Dg Chest 2 View  Result Date: 02/01/2016 CLINICAL DATA:  Evaluate pleural effusion EXAM: CHEST  2 VIEW COMPARISON:  PA lateral chest x-ray of January 30, 2016 FINDINGS: There remains a large left pleural effusion. Further decrease in the amount of aerated right lung is observed and likely less than 10% of the lung is now aerated. There is no mediastinal shift. The left lung is well-expanded. Interstitial markings on the left are mildly prominent. And no left pleural effusion is observed. The left heart border is normal. The pulmonary vascularity is not engorged. IMPRESSION: Further interval increase in the large right pleural effusion with only a small amount of aerated right lung remaining. Electronically Signed   By: David  Martinique M.D.   On: 02/01/2016 07:07   Dg Chest Port 1 View  Result Date: 02/01/2016 CLINICAL DATA:  Status post VATS EXAM: PORTABLE CHEST 1 VIEW COMPARISON:  None. FINDINGS: There is a large caliber right-sided chest tube in place along with a small caliber PleurX drainage type catheter. Despite this there is a large right pneumothorax. The right IJ catheter tip is in the distal SVC. The left lung is relatively clear. IMPRESSION: Right-sided chest tubes in good position but there is a 50% pneumothorax. Right IJ catheter tip is in the distal SVC. These results were called by telephone at the time of interpretation on 02/01/2016 at 10:22 am to Dr. Ivin Poot , who verbally acknowledged these results. Electronically Signed   By: Marijo Sanes M.D.   On: 02/01/2016 10:23    Scheduled Meds: . acetaminophen  1,000 mg Oral Q6H   Or  . acetaminophen (TYLENOL) oral liquid 160 mg/5  mL  1,000 mg Oral Q6H  . bisacodyl  10 mg Oral Daily  . cefUROXime (ZINACEF)  IV  1.5 g Intravenous Q12H  . fentaNYL   Intravenous Q4H  . levalbuterol  0.63 mg Nebulization Q6H  . metoCLOPramide (REGLAN) injection  10 mg Intravenous Q6H  . metoprolol tartrate  12.5 mg Oral BID  . senna-docusate  1 tablet Oral QHS  . sodium chloride flush  3 mL Intravenous Q12H    Continuous Infusions: . 0.9 % NaCl with KCl 20 mEq / L       Time spent: 18mns  RElmarie ShileyMD [facility] Pager [phone] If 7PM-7AM, please contact night-coverage at [email or web address], password [facility]9/06/2015, 1:18 PM  LOS: 6 days

## 2016-02-01 NOTE — Anesthesia Procedure Notes (Signed)
Procedure Name: Intubation Date/Time: 02/01/2016 7:43 AM Performed by: Shirlyn Goltz Pre-anesthesia Checklist: Patient identified, Emergency Drugs available and Suction available Patient Re-evaluated:Patient Re-evaluated prior to inductionOxygen Delivery Method: Circle system utilized Preoxygenation: Pre-oxygenation with 100% oxygen Intubation Type: IV induction Ventilation: Mask ventilation without difficulty Laryngoscope Size: Mac and 3 Grade View: Grade II Tube type: Oral Tube size: 8.5 mm Number of attempts: 1 Airway Equipment and Method: Stylet Placement Confirmation: ETT inserted through vocal cords under direct vision,  positive ETCO2 and breath sounds checked- equal and bilateral Secured at: 23 cm Tube secured with: Tape Dental Injury: Teeth and Oropharynx as per pre-operative assessment

## 2016-02-01 NOTE — Anesthesia Procedure Notes (Signed)
Central Venous Catheter Insertion Performed by: anesthesiologist Patient location: Pre-op. Preanesthetic checklist: patient identified, IV checked, site marked, risks and benefits discussed, surgical consent, monitors and equipment checked, pre-op evaluation, timeout performed and anesthesia consent Lidocaine 1% used for infiltration Landmarks identified Catheter size: 8 Fr Central line was placed.Double lumen Procedure performed using ultrasound guided technique. Attempts: 1 Following insertion, dressing applied, line sutured and Biopatch. Post procedure assessment: blood return through all ports. Patient tolerated the procedure well with no immediate complications.       Right IJ vein image

## 2016-02-01 NOTE — Anesthesia Preprocedure Evaluation (Signed)
Anesthesia Evaluation  Patient identified by MRN, date of birth, ID band Patient awake    Reviewed: Allergy & Precautions, NPO status , Patient's Chart, lab work & pertinent test results  Airway Mallampati: I  TM Distance: >3 FB Neck ROM: Full    Dental  (+) Teeth Intact, Dental Advisory Given   Pulmonary former smoker,    breath sounds clear to auscultation       Cardiovascular  Rhythm:Regular Rate:Normal     Neuro/Psych    GI/Hepatic   Endo/Other    Renal/GU      Musculoskeletal   Abdominal   Peds  Hematology   Anesthesia Other Findings   Reproductive/Obstetrics                             Anesthesia Physical Anesthesia Plan  ASA: III  Anesthesia Plan: General   Post-op Pain Management:    Induction: Intravenous  Airway Management Planned: Double Lumen EBT  Additional Equipment:   Intra-op Plan:   Post-operative Plan: Extubation in OR  Informed Consent: I have reviewed the patients History and Physical, chart, labs and discussed the procedure including the risks, benefits and alternatives for the proposed anesthesia with the patient or authorized representative who has indicated his/her understanding and acceptance.   Dental advisory given  Plan Discussed with: CRNA, Anesthesiologist and Surgeon  Anesthesia Plan Comments:         Anesthesia Quick Evaluation

## 2016-02-01 NOTE — Transfer of Care (Signed)
Immediate Anesthesia Transfer of Care Note  Patient: Roger Barnes  Procedure(s) Performed: Procedure(s): VIDEO ASSISTED THORACOSCOPY (Right) INSERTION PLEURAL DRAINAGE CATHETER (Right) DOXYCYCLINE PLEURADESIS (Right) VIDEO BRONCHOSCOPY (N/A)  Patient Location: PACU  Anesthesia Type:General  Level of Consciousness: awake, alert , oriented and patient cooperative  Airway & Oxygen Therapy: Patient Spontanous Breathing and Patient connected to nasal cannula oxygen  Post-op Assessment: Report given to RN and Post -op Vital signs reviewed and stable  Post vital signs: Reviewed and stable  Last Vitals:  Vitals:   01/31/16 2249 02/01/16 0554  BP: 121/88 131/86  Pulse: (!) 114 (!) 105  Resp:  18  Temp:  37 C    Last Pain:  Vitals:   02/01/16 0554  TempSrc: Oral  PainSc:       Patients Stated Pain Goal: 0 (62/44/69 5072)  Complications: No apparent anesthesia complications

## 2016-02-02 ENCOUNTER — Inpatient Hospital Stay (HOSPITAL_COMMUNITY): Payer: Medicare HMO

## 2016-02-02 LAB — BASIC METABOLIC PANEL
Anion gap: 6 (ref 5–15)
CALCIUM: 8.1 mg/dL — AB (ref 8.9–10.3)
CHLORIDE: 104 mmol/L (ref 101–111)
CO2: 27 mmol/L (ref 22–32)
CREATININE: 0.65 mg/dL (ref 0.61–1.24)
GFR calc non Af Amer: >60 mL/min (ref >60)
GLUCOSE: 120 mg/dL — AB (ref 65–99)
Potassium: 4 mmol/L (ref 3.5–5.1)
Sodium: 137 mmol/L (ref 135–145)

## 2016-02-02 LAB — CBC
HCT: 34.9 % — ABNORMAL LOW (ref 39.0–52.0)
Hemoglobin: 10.7 g/dL — ABNORMAL LOW (ref 13.0–17.0)
MCH: 28.6 pg (ref 26.0–34.0)
MCHC: 30.7 g/dL (ref 30.0–36.0)
MCV: 93.3 fL (ref 78.0–100.0)
PLATELETS: 289 10*3/uL (ref 150–400)
RBC: 3.74 MIL/uL — AB (ref 4.22–5.81)
RDW: 14.3 % (ref 11.5–15.5)
WBC: 8.9 10*3/uL (ref 4.0–10.5)

## 2016-02-02 LAB — BLOOD GAS, ARTERIAL
ACID-BASE EXCESS: 3.5 mmol/L — AB (ref 0.0–2.0)
BICARBONATE: 27.3 mmol/L (ref 20.0–28.0)
Drawn by: 10006
O2 CONTENT: 2 L/min
O2 SAT: 97.8 %
PCO2 ART: 39.3 mmHg (ref 32.0–48.0)
PH ART: 7.456 — AB (ref 7.350–7.450)
Patient temperature: 98.6
pO2, Arterial: 93.6 mmHg (ref 83.0–108.0)

## 2016-02-02 MED ORDER — LEVALBUTEROL HCL 0.63 MG/3ML IN NEBU
0.6300 mg | INHALATION_SOLUTION | Freq: Three times a day (TID) | RESPIRATORY_TRACT | Status: DC
  Administered 2016-02-03 – 2016-02-04 (×4): 0.63 mg via RESPIRATORY_TRACT
  Filled 2016-02-02 (×4): qty 3

## 2016-02-02 NOTE — Progress Notes (Signed)
PROGRESS NOTE  Roger Barnes WJX:[id number] DOB: 1961/01/23 DOA: 01/25/2016 PCP: No PCP Per Patient  Brief summary:   Lynton Crescenzo is a 55 y.o. male with no significant PMH. Patient has not seen a healthcare provider in many years.. He takes no over-the-counter or prescription medications. Approximately 2 weeks ago patient began to get short of breath with just minimal exertion.. He has intermittent sharp pain in right scapula. No chest pain. Patient does complain of upper abdominal discomfort described as a bloated sensation. No nausea or vomiting but appetite has been suboptimal. Patient thinks he is also 5-10 pounds over the last couple of weeks. No cough . No fevers or night sweats . Chest x-ray shows large right pleural effusion. Patient has a history of smoking approximately 10 years ago.   He is found to have opacification of the right lung with large right pleural effusion with shift of the mediastinal to the left  HPI/Recap of past 24 hours: Doing well this morning, denies worsening dyspnea. Feels he is able to expand his chest.  uncomfortable with all the lines and tubes, and machines beeping.    Assessment/Plan: Active Problems:   Pleural effusion, right   Pleural effusion   Elevated d-dimer   Abnormal EKG   Acute respiratory failure with hypoxia (HCC)   Atelectasis   Adenocarcinoma, lung (HCC)  Large pleural effusion, Malignant: Adenocarcinoma lung, occupying most of the right hemithorax with mediastinum shift and liver shift to the left.  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  S/P thoracentesis 8/30,  2 L removed.  -Pulmonology and IR input appreciated.  -Dr Julien Nordmann will arrange outpatient follow up.  -CVTS consulted for evaluation for Pleur-x catheter placement.  -Appreciate Dr Prescott Gum. Patient S/P  VATS 9-01  with chemical pleurodesis and Pleur catheter placement.  -Pneumothorax 50 %,  Management per CVTS.  -patient aware that he will need to follow up with Dr Julien Nordmann for tx.  -chest x ray pending   Leukocytosis: from stress? Does not look septic, pleural fluids no organism seen, culture no growth so far, stop empiric abx. Wbc normalized.  AKI: cr 1.32 on admission, cr normalized with ivf. D/c ivf.  Acute blood loss anemia; post procedure, expected. Monitor hb.    Code Status: full  Family Communication: patient.   Disposition Plan: remain in the hospital, VATS 9-01   Consultants:  Pulmonology  IR  Procedures:  Thoracentesis 8/25, 8/27  Antibiotics:  Zosyn received 2 days of treatment.    Objective: BP 124/79 (BP Location: Left Arm)   Pulse 99   Temp 98.1 F (36.7 C) (Oral)   Resp 19   Ht '5\' 10"'$  (1.778 m)   Wt 76.4 kg (168 lb 6.4 oz)   SpO2 98%   BMI 24.16 kg/m   Intake/Output Summary (Last 24 hours) at 02/02/16 0736 Last data filed at 02/02/16 0417  Gross per 24 hour  Intake           2617.5 ml  Output             2500 ml  Net            117.5 ml   Filed Weights   01/29/16 0517 01/30/16 0635 02/01/16 0554  Weight: 79.2 kg (174 lb 8 oz) 79.1 kg (174 lb 6.4 oz) 76.4 kg (168 lb 6.4 oz)  Exam:   General:  NAD, thin  Cardiovascular: less sinus tachycardia  Respiratory: diminished breath sounds on right, clear on left  Abdomen: Soft/ND/NT, positive BS  Musculoskeletal: No Edema  Neuro: aaox3  Data Reviewed: Basic Metabolic Panel:  Recent Labs Lab 01/27/16 0525 01/28/16 0508 01/29/16 0357 01/30/16 0335 01/31/16 1235 02/02/16 0540  NA 139 136 137 139 138 137  K 3.8 3.5 3.6 3.9 3.8 4.0  CL 103 102 103 103 106 104  CO2 '28 26 29 29 25 27  '$ GLUCOSE 93 89 117* 99 121* 120*  BUN '17 11 9 9 9 '$ <5*  CREATININE 1.09 0.84 0.70 0.73 0.80 0.65  CALCIUM 8.5* 8.3* 8.0* 8.3* 8.7* 8.1*  MG 2.4  --  2.2  --   --   --     Liver Function Tests:  Recent Labs Lab 01/31/16 1235  AST 50*  ALT 61  ALKPHOS 74  BILITOT 0.4  PROT 6.6  ALBUMIN 2.6*   No results for input(s): LIPASE, AMYLASE in the last 168 hours. No results for input(s): AMMONIA in the last 168 hours. CBC:  Recent Labs Lab 01/27/16 0525 01/28/16 0508 01/30/16 0335 01/31/16 1235 02/02/16 0540  WBC 8.5 7.5 7.6 8.8 8.9  HGB 11.6* 11.3* 10.8* 12.5* 10.7*  HCT 36.8* 35.8* 35.6* 39.6 34.9*  MCV 93.9 92.3 93.4 92.7 93.3  PLT 366 333 310 354 289   Cardiac Enzymes:   No results for input(s): CKTOTAL, CKMB, CKMBINDEX, TROPONINI in the last 168 hours. BNP (last 3 results) No results for input(s): BNP in the last 8760 hours.  ProBNP (last 3 results) No results for input(s): PROBNP in the last 8760 hours.  CBG: No results for input(s): GLUCAP in the last 168 hours.  Recent Results (from the past 240 hour(s))  Culture, body fluid-bottle     Status: None   Collection Time: 01/25/16 12:44 PM  Result Value Ref Range Status   Specimen Description FLUID PLEURAL  Final   Special Requests NONE  Final   Culture NO GROWTH 5 DAYS  Final   Report Status 01/30/2016 FINAL  Final  Gram stain     Status: None   Collection Time: 01/25/16 12:44 PM  Result Value Ref Range Status   Specimen Description FLUID PLEURAL  Final   Special Requests NONE  Final   Gram Stain   Final    FEW WBC PRESENT,BOTH PMN AND MONONUCLEAR NO ORGANISMS SEEN    Report Status 01/25/2016 FINAL  Final  MRSA PCR Screening     Status: None   Collection Time: 01/25/16  3:25 PM  Result Value Ref Range Status   MRSA by PCR NEGATIVE NEGATIVE Final    Comment:        The GeneXpert MRSA Assay (FDA approved for NASAL specimens only), is one component of a comprehensive MRSA colonization surveillance program. It is not intended to diagnose MRSA infection nor to guide or monitor treatment for MRSA infections.   Culture, body fluid-bottle     Status: None (Preliminary  result)   Collection Time: 01/30/16 10:27 AM  Result Value Ref Range Status   Specimen Description FLUID RIGHT PLEURAL  Final   Special Requests NONE  Final   Culture NO GROWTH 2 DAYS  Final   Report Status PENDING  Incomplete  Gram stain     Status: None   Collection Time: 01/30/16 10:27 AM  Result Value Ref Range Status   Specimen Description FLUID RIGHT PLEURAL  Final   Special  Requests NONE  Final   Gram Stain   Final    ABUNDANT WBC PRESENT,BOTH PMN AND MONONUCLEAR NO ORGANISMS SEEN    Report Status 01/30/2016 FINAL  Final  Gram stain     Status: None   Collection Time: 02/01/16  8:38 AM  Result Value Ref Range Status   Specimen Description FLUID PLEURAL RIGHT  Final   Special Requests PATIENT ON FOLLOWING ZINACEF  Final   Gram Stain   Final    FEW WBC PRESENT, PREDOMINANTLY MONONUCLEAR NO ORGANISMS SEEN    Report Status 02/01/2016 FINAL  Final     Studies: Dg Chest Port 1 View  Result Date: 02/01/2016 CLINICAL DATA:  Status post VATS EXAM: PORTABLE CHEST 1 VIEW COMPARISON:  None. FINDINGS: There is a large caliber right-sided chest tube in place along with a small caliber PleurX drainage type catheter. Despite this there is a large right pneumothorax. The right IJ catheter tip is in the distal SVC. The left lung is relatively clear. IMPRESSION: Right-sided chest tubes in good position but there is a 50% pneumothorax. Right IJ catheter tip is in the distal SVC. These results were called by telephone at the time of interpretation on 02/01/2016 at 10:22 am to Dr. Ivin Poot , who verbally acknowledged these results. Electronically Signed   By: Marijo Sanes M.D.   On: 02/01/2016 10:23    Scheduled Meds: . acetaminophen  1,000 mg Oral Q6H   Or  . acetaminophen (TYLENOL) oral liquid 160 mg/5 mL  1,000 mg Oral Q6H  . bisacodyl  10 mg Oral Daily  . cefUROXime (ZINACEF)  IV  1.5 g Intravenous Q12H  . fentaNYL   Intravenous Q4H  . levalbuterol  0.63 mg Nebulization Q6H  .  metoprolol tartrate  12.5 mg Oral BID  . senna-docusate  1 tablet Oral QHS  . sodium chloride flush  3 mL Intravenous Q12H    Continuous Infusions: . 0.9 % NaCl with KCl 20 mEq / L 75 mL/hr at 02/02/16 0141     Time spent: 62mns  RElmarie ShileyMD [facility] Pager [phone] If 7PM-7AM, please contact night-coverage at [email or web address], password [email or web address]9/07/2015, 7:36 AM  LOS: 7 days

## 2016-02-02 NOTE — Progress Notes (Addendum)
      [address]       [place] [address]             [phone]       1 Day Post-Op Procedure(s) (LRB): VIDEO ASSISTED THORACOSCOPY (Right) INSERTION PLEURAL DRAINAGE CATHETER (Right) DOXYCYCLINE PLEURADESIS (Right) VIDEO BRONCHOSCOPY (N/A)  Subjective: Patient eating lunch without complaints  Objective: Vital signs in last 24 hours: Temp:  [97.1 F (36.2 C)-98.1 F (36.7 C)] 97.6 F (36.4 C) (09/02 1118) Pulse Rate:  [89-108] 100 (09/02 1118) Cardiac Rhythm: Sinus tachycardia (09/02 1118) Resp:  [12-25] 20 (09/02 1131) BP: (100-124)/(70-84) 100/70 (09/02 1118) SpO2:  [95 %-100 %] 98 % (09/02 1131) Arterial Line BP: (103-131)/(64-70) 131/70 (09/02 1118)      Intake/Output from previous day: 09/01 0701 - 09/02 0700 In: 2617.5 [I.V.:2567.5; IV Piggyback:50] Out: 2500 [Urine:2000; Blood:100; Chest Tube:400]   Physical Exam:  Cardiovascular: Slightly tachy Pulmonary: Clear to auscultation on right and diminished left base Abdomen: Soft, non tender, bowel sounds present. Wounds: Dressing is clean and dry.   Chest Tube: to suction, no air leak  Lab Results: CBC: Recent Labs  01/31/16 1235 02/02/16 0540  WBC 8.8 8.9  HGB 12.5* 10.7*  HCT 39.6 34.9*  PLT 354 289   BMET:  Recent Labs  01/31/16 1235 02/02/16 0540  NA 138 137  K 3.8 4.0  CL 106 104  CO2 25 27  GLUCOSE 121* 120*  BUN 9 <5*  CREATININE 0.80 0.65  CALCIUM 8.7* 8.1*    PT/INR:  Recent Labs  01/31/16 1235  LABPROT 15.3*  INR 1.20   ABG:  INR: Will add last result for INR, ABG once components are confirmed Will add last 4 CBG results once components are confirmed  Assessment/Plan:  1. CV - Slightly tachy with low 100's. On Lopressor 12.5 mg bid. 2.  Pulmonary - Chest tube with 400 cc of output (100 cc since 7 am). CXR appears stable. Chest tube is to suction and there is no air leak. As discussed with Dr. Servando Snare, will leave chest tube for tdoay Drain Pleur  X on Monday. 3. Remove a line 4. Decrease IVF 5. Remove foley in am  ZIMMERMAN,DONIELLE MPA-C 02/02/2016,12:57 PM  I have seen and examined Roger Barnes and agree with the above assessment  and plan.  Grace Isaac MD Beeper [phone] Office [phone] 02/02/2016 3:55 PM

## 2016-02-02 NOTE — Op Note (Signed)
NAME:  Roger Barnes, Roger Barnes NO.:  [id number]  MEDICAL RECORD NO.:  [id number]  LOCATION:  3S06C                        FACILITY:  [facility]  PHYSICIAN:  Ivin Poot, M.D.  DATE OF BIRTH:  Jun 02, 1961  DATE OF PROCEDURE:  02/01/2016 DATE OF DISCHARGE:                              OPERATIVE REPORT   OPERATIONS: 1. Video bronchoscopy. 2. Right VATS with drainage of pleural effusion and chemical     pleurodesis. 3. Placement of right PleurX catheter.  SURGEON:  Ivin Poot, M.D.  ASSISTANT:  Ellwood Handler, PA-C.  PREOPERATIVE DIAGNOSES: 1. Recurrent large right malignant effusion. 2. Adenocarcinoma of the right lung. 3. History of smoking.  POSTOPERATIVE DIAGNOSES: 1. Recurrent malignant right pleural effusion. 2. 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 3. History of tobacco abuse.  ANESTHESIA:  General by Dr. Lorrene Reid.  CLINICAL FINDINGS: 1. A 2.2 L of bloody pleural fluid were drained. 2. Tumor implants on the parietal pleural surface were biopsied and     sent for Pathology. 3. Tumor in the right upper lobe visceral pleura was removed and sent     for Pathology. 4. 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  OPERATIVE PROCEDURE:  The patient was brought to the operating room and placed supine on the operating room table where general anesthesia was induced.  A proper time-out was performed.  Through the endotracheal tube, a video bronchoscope was passed.  The distal trachea and carina were inspected and were normal.  The left mainstem bronchus and the endobronchial segments of the left upper lobe and left lower lobe were visualized and were normal.  The bronchoscope was passed on the right mainstem bronchus.  There were no endobronchial lesions.  The right upper lobe and right middle lobe endobronchial segments were visualized and were normal.   The right lower lobe basilar segments were narrowed from external compression.  Washings of the right lower lobe were taken and sent for cytology.  Bronchoscope was removed.  The patient then had the endotracheal tube exchanged for double-lumen tube and the patient was turned with right side up and positioned.  The right chest was prepped and draped as a sterile field.  A proper time- out was repeated.  A small incision was made in the fifth interspace anterior to the tip of the scapula.  The pleural space was entered carefully.  The lung was atelectatic.  The pleural space was drained of 2.2 L of serosanguineous fluid.  The VATS scope was inserted and the lung and parietal pleural surface were visualized.  There was evidence of tumor studding diffusely over the visceral pleura.  The parietal pleura also had thickening and evidence of tumor studding as well.  Biopsies of both the visceral pleura and the parietal pleura were taken and sent for pathology and for molecular markers.  The surface of the lung from which visceral pleural tumor was removed was coated with a medical adhesive - Coseal.  Pleurodesis using doxycycline was then sprayed over the entire right hemothorax.  This was administered through a  red rubber Robinson catheter.  Next, two small incisions were made in the right anterior chest.  The upper incision was used for the insertion of the PleurX catheter, which was then directed under direct vision into the posterior aspect of the right pleural space.  It was tunneled and then brought out through a small incision at the right costal margin.  The PleurX catheter insertion site was then closed in layers using interrupted Vicryl for the subcutaneous layer and interrupted nylon for the skin.  The exit site was closed with a #1 silk, which was used to secure the catheter.  Next, a 28-French chest tube was inserted in a separate small incision more laterally from the PleurX  catheter sites and directed to the apex of the pleural space.  It was secured to the skin and connected to a Pleur-evac drainage system.  Next, the right lung was inflated by the Anesthesia team and had incomplete reexpansion due to entrapment from tumor.  A #2 Vicryl pericostal suture was placed to reapproximate the ribs.  The VATS incision was closed in layers using interrupted #1 Vicryl for the muscle layer and a running 2-0 Vicryl for the subcutaneous and a running subcuticular Vicryl for the skin.  The patient was then turned supine and anesthesia was reversed, and the patient was extubated and returned to the recovery room.  A chest x-ray taken in the operating showed tubes to be in good position with incomplete reexpansion of the lung due to entrapment.     Ivin Poot, M.D.     PV/MEDQ  D:  02/01/2016  T:  02/02/2016  Job:  [id number]

## 2016-02-03 ENCOUNTER — Inpatient Hospital Stay (HOSPITAL_COMMUNITY): Payer: Medicare HMO

## 2016-02-03 ENCOUNTER — Encounter (HOSPITAL_COMMUNITY): Payer: Self-pay | Admitting: Cardiothoracic Surgery

## 2016-02-03 LAB — COMPREHENSIVE METABOLIC PANEL
ALT: 28 U/L (ref 17–63)
ANION GAP: 3 — AB (ref 5–15)
AST: 21 U/L (ref 15–41)
Albumin: 2 g/dL — ABNORMAL LOW (ref 3.5–5.0)
Alkaline Phosphatase: 60 U/L (ref 38–126)
BUN: 5 mg/dL — ABNORMAL LOW (ref 6–20)
CHLORIDE: 105 mmol/L (ref 101–111)
CO2: 30 mmol/L (ref 22–32)
CREATININE: 0.65 mg/dL (ref 0.61–1.24)
Calcium: 8 mg/dL — ABNORMAL LOW (ref 8.9–10.3)
Glucose, Bld: 99 mg/dL (ref 65–99)
POTASSIUM: 3.8 mmol/L (ref 3.5–5.1)
SODIUM: 138 mmol/L (ref 135–145)
Total Bilirubin: 0.4 mg/dL (ref 0.3–1.2)
Total Protein: 5.3 g/dL — ABNORMAL LOW (ref 6.5–8.1)

## 2016-02-03 LAB — CBC
HCT: 35.4 % — ABNORMAL LOW (ref 39.0–52.0)
Hemoglobin: 10.8 g/dL — ABNORMAL LOW (ref 13.0–17.0)
MCH: 28.6 pg (ref 26.0–34.0)
MCHC: 30.5 g/dL (ref 30.0–36.0)
MCV: 93.9 fL (ref 78.0–100.0)
PLATELETS: 301 10*3/uL (ref 150–400)
RBC: 3.77 MIL/uL — AB (ref 4.22–5.81)
RDW: 14.4 % (ref 11.5–15.5)
WBC: 8.2 10*3/uL (ref 4.0–10.5)

## 2016-02-03 MED ORDER — METOPROLOL TARTRATE 25 MG PO TABS
25.0000 mg | ORAL_TABLET | Freq: Two times a day (BID) | ORAL | Status: DC
  Administered 2016-02-03 – 2016-02-05 (×5): 25 mg via ORAL
  Filled 2016-02-03 (×5): qty 1

## 2016-02-03 MED ORDER — POLYETHYLENE GLYCOL 3350 17 G PO PACK
17.0000 g | PACK | Freq: Two times a day (BID) | ORAL | Status: DC
  Administered 2016-02-03 – 2016-02-04 (×3): 17 g via ORAL
  Filled 2016-02-03 (×4): qty 1

## 2016-02-03 MED ORDER — ENSURE ENLIVE PO LIQD
237.0000 mL | Freq: Two times a day (BID) | ORAL | Status: DC
  Administered 2016-02-03 – 2016-02-04 (×4): 237 mL via ORAL

## 2016-02-03 NOTE — Progress Notes (Addendum)
PROGRESS NOTE  Roger Barnes UXN:[id number] DOB: 01-03-1961 DOA: 01/25/2016 PCP: No PCP Per Patient  Brief summary:   Roger Barnes is a 55 y.o. male with no significant PMH. Patient has not seen a healthcare provider in many years.. He takes no over-the-counter or prescription medications. Approximately 2 weeks ago patient began to get short of breath with just minimal exertion.. He has intermittent sharp pain in right scapula. No chest pain. Patient does complain of upper abdominal discomfort described as a bloated sensation. No nausea or vomiting but appetite has been suboptimal. Patient thinks he is also 5-10 pounds over the last couple of weeks. No cough . No fevers or night sweats . Chest x-ray shows large right pleural effusion. Patient has a history of smoking approximately 10 years ago.   He is found to have opacification of the right lung with large right pleural effusion with shift of the mediastinal to the left  HPI/Recap of past 24 hours: No Bowel movement yet.. Foley catheter removed this morning.  Breathing ok. .    Assessment/Plan: Active Problems:   Pleural effusion, right   Pleural effusion   Elevated d-dimer   Abnormal EKG   Acute respiratory failure with hypoxia (HCC)   Atelectasis   Adenocarcinoma, lung (HCC)  Large pleural effusion, Malignant: Adenocarcinoma lung, occupying most of the right hemithorax with mediastinum shift and liver shift to the left.  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  S/P thoracentesis 8/30,  2 L removed.  -Pulmonology and IR input appreciated.  -Dr Julien Nordmann will arrange outpatient follow up.  -CVTS consulted for evaluation for Pleur-x catheter placement.  -Appreciate Dr Prescott Gum. Patient S/P  VATS 9-01  with chemical pleurodesis and Pleur catheter  placement.  -Pneumothorax 50 %,  Management per CVTS.  -patient aware that he will need to follow up with Dr Julien Nordmann for tx.  -chest x ray pending for this morning. Chest x ray 9-02 with  improved aeration.   Leukocytosis: Resolved.   from stress? Does not look septic, pleural fluids no organism seen, culture no growth so far, stop empiric abx. Wbc normalized.  AKI: cr 1.32 on admission, cr normalized with ivf.  Acute blood loss anemia; post procedure, expected. Hb stable at 10.   Constipation; will add miralax.   Code Status: full  Family Communication: patient.   Disposition Plan: remain in the hospital, VATS 9-01   Consultants:  Pulmonology  IR  Procedures:  Thoracentesis 8/25, 8/27  Antibiotics:  Zosyn received 2 days of treatment.    Objective: BP 119/83 (BP Location: Left Arm)   Pulse (!) 102   Temp 98.3 F (36.8 C) (Oral)   Resp (!) 23   Ht '5\' 10"'$  (1.778 m)   Wt 76.4 kg (168 lb 6.4 oz)   SpO2 98%   BMI 24.16 kg/m   Intake/Output Summary (Last 24 hours) at 02/03/16 0724 Last data filed at 02/03/16 0600  Gross per 24 hour  Intake          1989.08 ml  Output             2235 ml  Net          -245.92 ml   Filed Weights   01/29/16 0517 01/30/16 0635 02/01/16 0554  Weight: 79.2 kg (174 lb 8 oz) 79.1 kg (174 lb 6.4 oz) 76.4  kg (168 lb 6.4 oz)    Exam:   General:  NAD, thin  Cardiovascular: less sinus tachycardia  Respiratory: diminished breath sounds on right, clear on left, chest tube right, pleurex catheter   Abdomen: Soft/ND/NT, positive BS  Musculoskeletal: No Edema  Neuro: aaox3  Data Reviewed: Basic Metabolic Panel:  Recent Labs Lab 01/29/16 0357 01/30/16 0335 01/31/16 1235 02/02/16 0540 02/03/16 0241  NA 137 139 138 137 138  K 3.6 3.9 3.8 4.0 3.8  CL 103 103 106 104 105  CO2 '29 29 25 27 30  '$ GLUCOSE 117* 99 121* 120* 99  BUN '9 9 9 '$ <5* 5*  CREATININE 0.70 0.73 0.80 0.65 0.65  CALCIUM 8.0* 8.3* 8.7* 8.1* 8.0*  MG 2.2  --    --   --   --    Liver Function Tests:  Recent Labs Lab 01/31/16 1235 02/03/16 0241  AST 50* 21  ALT 61 28  ALKPHOS 74 60  BILITOT 0.4 0.4  PROT 6.6 5.3*  ALBUMIN 2.6* 2.0*   No results for input(s): LIPASE, AMYLASE in the last 168 hours. No results for input(s): AMMONIA in the last 168 hours. CBC:  Recent Labs Lab 01/28/16 0508 01/30/16 0335 01/31/16 1235 02/02/16 0540 02/03/16 0241  WBC 7.5 7.6 8.8 8.9 8.2  HGB 11.3* 10.8* 12.5* 10.7* 10.8*  HCT 35.8* 35.6* 39.6 34.9* 35.4*  MCV 92.3 93.4 92.7 93.3 93.9  PLT 333 310 354 289 301   Cardiac Enzymes:   No results for input(s): CKTOTAL, CKMB, CKMBINDEX, TROPONINI in the last 168 hours. BNP (last 3 results) No results for input(s): BNP in the last 8760 hours.  ProBNP (last 3 results) No results for input(s): PROBNP in the last 8760 hours.  CBG: No results for input(s): GLUCAP in the last 168 hours.  Recent Results (from the past 240 hour(s))  Culture, body fluid-bottle     Status: None   Collection Time: 01/25/16 12:44 PM  Result Value Ref Range Status   Specimen Description FLUID PLEURAL  Final   Special Requests NONE  Final   Culture NO GROWTH 5 DAYS  Final   Report Status 01/30/2016 FINAL  Final  Gram stain     Status: None   Collection Time: 01/25/16 12:44 PM  Result Value Ref Range Status   Specimen Description FLUID PLEURAL  Final   Special Requests NONE  Final   Gram Stain   Final    FEW WBC PRESENT,BOTH PMN AND MONONUCLEAR NO ORGANISMS SEEN    Report Status 01/25/2016 FINAL  Final  MRSA PCR Screening     Status: None   Collection Time: 01/25/16  3:25 PM  Result Value Ref Range Status   MRSA by PCR NEGATIVE NEGATIVE Final    Comment:        The GeneXpert MRSA Assay (FDA approved for NASAL specimens only), is one component of a comprehensive MRSA colonization surveillance program. It is not intended to diagnose MRSA infection nor to guide or monitor treatment for MRSA infections.     Culture, body fluid-bottle     Status: None (Preliminary result)   Collection Time: 01/30/16 10:27 AM  Result Value Ref Range Status   Specimen Description FLUID RIGHT PLEURAL  Final   Special Requests NONE  Final   Culture NO GROWTH 3 DAYS  Final   Report Status PENDING  Incomplete  Gram stain     Status: None   Collection Time: 01/30/16 10:27 AM  Result Value Ref Range Status  Specimen Description FLUID RIGHT PLEURAL  Final   Special Requests NONE  Final   Gram Stain   Final    ABUNDANT WBC PRESENT,BOTH PMN AND MONONUCLEAR NO ORGANISMS SEEN    Report Status 01/30/2016 FINAL  Final  Culture, body fluid-bottle     Status: None (Preliminary result)   Collection Time: 02/01/16  8:38 AM  Result Value Ref Range Status   Specimen Description FLUID PLEURAL RIGHT  Final   Special Requests PATIENT ON FOLLOWING ZINACEF  Final   Culture NO GROWTH 1 DAY  Final   Report Status PENDING  Incomplete  Gram stain     Status: None   Collection Time: 02/01/16  8:38 AM  Result Value Ref Range Status   Specimen Description FLUID PLEURAL RIGHT  Final   Special Requests PATIENT ON FOLLOWING ZINACEF  Final   Gram Stain   Final    FEW WBC PRESENT, PREDOMINANTLY MONONUCLEAR NO ORGANISMS SEEN    Report Status 02/01/2016 FINAL  Final     Studies: No results found.  Scheduled Meds: . acetaminophen  1,000 mg Oral Q6H   Or  . acetaminophen (TYLENOL) oral liquid 160 mg/5 mL  1,000 mg Oral Q6H  . bisacodyl  10 mg Oral Daily  . fentaNYL   Intravenous Q4H  . levalbuterol  0.63 mg Nebulization TID  . metoprolol tartrate  12.5 mg Oral BID  . senna-docusate  1 tablet Oral QHS  . sodium chloride flush  3 mL Intravenous Q12H    Continuous Infusions: . 0.9 % NaCl with KCl 20 mEq / L 10 mL/hr at 02/03/16 0600     Time spent: 79mns  RElmarie ShileyMD [facility] Pager [phone] If 7PM-7AM, please contact night-coverage at [email or web address], password [facility]9/08/2015, 7:24 AM  LOS: 8 days

## 2016-02-03 NOTE — Progress Notes (Addendum)
      [address]       [place] [address]             [phone]       2 Days Post-Op Procedure(s) (LRB): VIDEO ASSISTED THORACOSCOPY (Right) INSERTION PLEURAL DRAINAGE CATHETER (Right) DOXYCYCLINE PLEURADESIS (Right) VIDEO BRONCHOSCOPY (N/A)  Subjective: Patient with some pain at chest tube site.  Objective: Vital signs in last 24 hours: Temp:  [97.6 F (36.4 C)-98.6 F (37 C)] 98.6 F (37 C) (09/03 0725) Pulse Rate:  [90-103] 98 (09/03 0725) Cardiac Rhythm: Sinus tachycardia (09/03 0700) Resp:  [15-36] 17 (09/03 0725) BP: (100-119)/(70-83) 113/74 (09/03 0725) SpO2:  [96 %-99 %] 96 % (09/03 0725) Arterial Line BP: (131)/(70) 131/70 (09/02 1118)      Intake/Output from previous day: 09/02 0701 - 09/03 0700 In: 1989.1 [P.O.:1080; I.V.:859.1; IV Piggyback:50] Out: 2235 [Urine:1725; Chest Tube:510]   Physical Exam:  Cardiovascular: Tachy Pulmonary: Clear to auscultation on leftt and diminished right base Abdomen: Soft, non tender, bowel sounds present. Wounds: Dressing is clean and dry.   Chest Tube: to suction, no air leak  Lab Results: CBC:  Recent Labs  02/02/16 0540 02/03/16 0241  WBC 8.9 8.2  HGB 10.7* 10.8*  HCT 34.9* 35.4*  PLT 289 301   BMET:   Recent Labs  02/02/16 0540 02/03/16 0241  NA 137 138  K 4.0 3.8  CL 104 105  CO2 27 30  GLUCOSE 120* 99  BUN <5* 5*  CREATININE 0.65 0.65  CALCIUM 8.1* 8.0*    PT/INR:   Recent Labs  01/31/16 1235  LABPROT 15.3*  INR 1.20   ABG:  INR: Will add last result for INR, ABG once components are confirmed Will add last 4 CBG results once components are confirmed  Assessment/Plan:  1. CV - Tachy with low 110's. On Lopressor 12.5 mg bid. Will increase to 25 mg bid for better HR control. 2.  Pulmonary - Chest tube with 510 cc of output. CXR appears stable. Chest tube is to suction and there is no air leak. Will remove chest tube. Start Pleur X drainage on Monday, record  output, and drain daily. Encourage incentive spirometer. 3. Anemia-H and H stable at 10.8 and 35.4 4. Foley removed early this am-await if able to void  5. Supplement potassium 6. Stop PCA after chest tube removed 7. Remove central line  ZIMMERMAN,DONIELLE MPA-C 02/03/2016,8:41 AM   D/c chest tube, d/c pca pump[ Start pleur ix drainage daily in am I have seen and examined Roger Barnes and agree with the above assessment  and plan.  Grace Isaac MD [facility] [phone] Office [phone] 02/03/2016 11:32 AM

## 2016-02-03 NOTE — Discharge Instructions (Signed)
Please drain right Pleur X catheter daily and record output   Thoracoscopy, Care After Refer to this sheet in the next few weeks. These instructions provide you with information about caring for yourself after your procedure. Your health care provider may also give you more specific instructions. Your treatment has been planned according to current medical practices, but problems sometimes occur. Call your health care provider if you have any problems or questions after your procedure. WHAT TO EXPECT AFTER THE PROCEDURE: After your procedure, it is common to feel sore for up to two weeks. HOME CARE INSTRUCTIONS  There are many different ways to close and cover an incision, including stitches (sutures), skin glue, and adhesive strips. Follow your health care provider's instructions about:  Incision care.  Bandage (dressing) changes and removal.  Incision closure removal.  Check your incision area every day for signs of infection. Watch for:  Redness, swelling, or pain.  Fluid, blood, or pus.  Take medicines only as directed by your health care provider.  Try to cough often. Coughing helps to protect against lung infection (pneumonia). It may hurt to cough. If this happens, hold a pillow against your chest when you cough.  Take deep breaths. This also helps to protect against pneumonia.  If you were given an incentive spirometer, use it as directed by your health care provider.  Do not take baths, swim, or use a hot tub until your health care provider approves. You may take showers.  Avoid lifting until your health care provider approves.  Avoid driving until your health care provider approves.  Do not travel by airplane after the chest tube is removed until your health care provider approves. SEEK MEDICAL CARE IF:  You have a fever.  Pain medicines do not ease your pain.  You have redness, swelling, or increasing pain in your incision area.  You develop a cough that does  not go away, or you are coughing up mucus that is yellow or green. SEEK IMMEDIATE MEDICAL CARE IF:  You have fluid, blood, or pus coming from your incision.  There is a bad smell coming from your incision or dressing.  You develop a rash.  You have difficulty breathing.  You cough up blood.  You develop light-headedness or you feel faint.  You develop chest pain.  Your heartbeat feels irregular or very fast.   This information is not intended to replace advice given to you by your health care provider. Make sure you discuss any questions you have with your health care provider.   Document Released: 12/06/2004 Document Revised: 06/09/2014 Document Reviewed: 02/01/2014 Elsevier Interactive Patient Education [employer].

## 2016-02-04 ENCOUNTER — Inpatient Hospital Stay (HOSPITAL_COMMUNITY): Payer: Medicare HMO

## 2016-02-04 LAB — CBC
HCT: 34.8 % — ABNORMAL LOW (ref 39.0–52.0)
HEMOGLOBIN: 10.6 g/dL — AB (ref 13.0–17.0)
MCH: 28.5 pg (ref 26.0–34.0)
MCHC: 30.5 g/dL (ref 30.0–36.0)
MCV: 93.5 fL (ref 78.0–100.0)
PLATELETS: 309 10*3/uL (ref 150–400)
RBC: 3.72 MIL/uL — AB (ref 4.22–5.81)
RDW: 14.3 % (ref 11.5–15.5)
WBC: 6.1 10*3/uL (ref 4.0–10.5)

## 2016-02-04 LAB — BASIC METABOLIC PANEL
ANION GAP: 3 — AB (ref 5–15)
BUN: 7 mg/dL (ref 6–20)
CHLORIDE: 104 mmol/L (ref 101–111)
CO2: 30 mmol/L (ref 22–32)
Calcium: 8.1 mg/dL — ABNORMAL LOW (ref 8.9–10.3)
Creatinine, Ser: 0.72 mg/dL (ref 0.61–1.24)
GFR calc Af Amer: >60 mL/min (ref >60)
GLUCOSE: 83 mg/dL (ref 65–99)
POTASSIUM: 3.6 mmol/L (ref 3.5–5.1)
SODIUM: 137 mmol/L (ref 135–145)

## 2016-02-04 LAB — TYPE AND SCREEN
ABO/RH(D): O POS
Antibody Screen: NEGATIVE
Unit division: 0

## 2016-02-04 LAB — CULTURE, BODY FLUID W GRAM STAIN -BOTTLE: Culture: NO GROWTH

## 2016-02-04 LAB — CULTURE, BODY FLUID-BOTTLE

## 2016-02-04 MED ORDER — LEVALBUTEROL HCL 0.63 MG/3ML IN NEBU
0.6300 mg | INHALATION_SOLUTION | Freq: Two times a day (BID) | RESPIRATORY_TRACT | Status: DC
  Administered 2016-02-04 – 2016-02-05 (×2): 0.63 mg via RESPIRATORY_TRACT
  Filled 2016-02-04 (×2): qty 3

## 2016-02-04 NOTE — Care Management Note (Signed)
Case Management Note  Patient Details  Name: Johnathin Vanderschaaf MRN: [id number] Date of Birth: 07/14/1960  Subjective/Objective:    Patient has pluerx drain, he has been set up with [facility] by previous NCM.  He will need plueux drain paper work to be completed by NCM before dc.                  Action/Plan:   Expected Discharge Date:  01/27/16               Expected Discharge Plan:  [place]  In-House Referral:  NA  Discharge planning Services  CM Consult  Post Acute Care Choice:  Home Health Choice offered to:  Patient  DME Arranged:    DME Agency:     HH Arranged:  RN [place] Agency:  [facility]  Status of Service:  In process, will continue to follow  If discussed at Long Length of Stay Meetings, dates discussed:    Additional Comments:  Zenon Mayo, RN 02/04/2016, 6:00 PM

## 2016-02-04 NOTE — Progress Notes (Signed)
Patient ID: Roger Barnes, male   DOB: 22-Jan-1961, 54 y.o.   MRN: [id number]      [address]       [place] [address]             [phone]       3 Days Post-Op Procedure(s) (LRB): VIDEO ASSISTED THORACOSCOPY (Right) INSERTION PLEURAL DRAINAGE CATHETER (Right) DOXYCYCLINE PLEURADESIS (Right) VIDEO BRONCHOSCOPY (N/A)  Subjective: Patient with some pain at chest tube site.  Objective: Vital signs in last 24 hours: Temp:  [98.1 F (36.7 C)-99 F (37.2 C)] 98.1 F (36.7 C) (09/04 0812) Pulse Rate:  [83-118] 108 (09/04 0940) Cardiac Rhythm: Sinus tachycardia (09/04 0740) Resp:  [11-21] 17 (09/04 0812) BP: (93-121)/(68-82) 104/68 (09/04 0940) SpO2:  [63 %-98 %] 87 % (09/04 0812)      Intake/Output from previous day: 09/03 0701 - 09/04 0700 In: 1263 [P.O.:1260; I.V.:3] Out: 1425 [Urine:1275; Chest Tube:150]   Physical Exam:  Cardiovascular: Tachy Pulmonary: Clear to auscultation on leftt and diminished right base Abdomen: Soft, non tender, bowel sounds present. Wounds: Dressing is clean and dry.   Chest Tube: to suction, no air leak  Lab Results: CBC:  Recent Labs  02/03/16 0241 02/04/16 0308  WBC 8.2 6.1  HGB 10.8* 10.6*  HCT 35.4* 34.8*  PLT 301 309   BMET:   Recent Labs  02/03/16 0241 02/04/16 0308  NA 138 137  K 3.8 3.6  CL 105 104  CO2 30 30  GLUCOSE 99 83  BUN 5* 7  CREATININE 0.65 0.72  CALCIUM 8.0* 8.1*    PT/INR:  No results for input(s): LABPROT, INR in the last 72 hours. ABG:  INR: Will add last result for INR, ABG once components are confirmed Will add last 4 CBG results once components are confirmed  Assessment/Plan:  1. CV - Tachy with low 110's. On Lopressor 12.5 mg bid. Will increase to 25 mg bid for better HR control. 2.  Pulmonary - Chest tube out , lung not fully expanded 450 ml out today Stared t Pleur X drainage today  3. Anemia-H and H stable at 10.6 and 34.8 Transfer to telemetry, poss  home soon  Kelby Aline 02/04/2016,11:03 AM

## 2016-02-04 NOTE — Progress Notes (Signed)
PROGRESS NOTE  Roger Barnes YQI:[id number] DOB: 06-13-1960 DOA: 01/25/2016 PCP: No PCP Per Roger Barnes  Brief summary:   Roger Barnes is a 55 y.o. male with no significant PMH. Roger Barnes has not seen a healthcare provider in many years.. He takes no over-the-counter or prescription medications. Approximately 2 weeks ago Roger Barnes began to get short of breath with just minimal exertion.. He has intermittent sharp pain in right scapula. No chest pain. Roger Barnes does complain of upper abdominal discomfort described as a bloated sensation. No nausea or vomiting but appetite has been suboptimal. Roger Barnes thinks he is also 5-10 pounds over the last couple of weeks. No cough . No fevers or night sweats . Chest x-ray shows large right pleural effusion. Roger Barnes has a history of smoking approximately 10 years ago.   He is found to have opacification of the right lung with large right pleural effusion with shift of the mediastinal to the left  HPI/Recap of past 24 hours: He is feeling well, shaving himself in the bathroom.  Denies dyspnea. Had BM. Has been able to urinate.     Assessment/Plan: Active Problems:   Pleural effusion, right   Pleural effusion   Elevated d-dimer   Abnormal EKG   Acute respiratory failure with hypoxia (HCC)   Atelectasis   Adenocarcinoma, lung (HCC)  Large pleural effusion, Malignant: Adenocarcinoma lung, occupying most of the right hemithorax with mediastinum shift and liver shift to the left.  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  S/P thoracentesis 8/30,  2 L removed.  -Pulmonology and IR input appreciated.  -Dr Julien Nordmann will arrange outpatient follow up.  -CVTS consulted for evaluation for Pleur-x catheter placement.  -Appreciate Dr Prescott Gum. Roger Barnes S/P  VATS 9-01  with chemical  pleurodesis and Pleur catheter placement.  -Pneumothorax 50 %,  Management per CVTS.  -Roger Barnes aware that he will need to follow up with Dr Julien Nordmann for tx.  -chest x ray pending for this morning. Chest x ray 9-03 stable.   Tachycardia;  Related to number one.  Metoprolol.   Leukocytosis: Resolved.   from stress? Does not look septic, pleural fluids no organism seen, culture no growth so far, stop empiric abx. Wbc normalized.  AKI: cr 1.32 on admission, cr normalized with ivf.  Acute blood loss anemia; post procedure, expected. Hb stable at 10.   Constipation;continue with miralax, had BM 9-03  Code Status: full  Family Communication: Roger Barnes.   Disposition Plan: remight be able to be transfer to telemetry    Consultants:  Pulmonology  IR  Procedures:  Thoracentesis 8/25, 8/27  Antibiotics:  Zosyn received 2 days of treatment.    Objective: BP 115/79 (BP Location: Right Arm)   Pulse 83   Temp 98.5 F (36.9 C) (Oral)   Resp 11   Ht '5\' 10"'$  (1.778 m)   Wt 76.4 kg (168 lb 6.4 oz)   SpO2 95%   BMI 24.16 kg/m   Intake/Output Summary (Last 24 hours) at 02/04/16 0719 Last data filed at 02/04/16 0322  Gross per 24 hour  Intake             1260 ml  Output             1425 ml  Net             -165 ml   [employer]  01/29/16 0517 01/30/16 0635 02/01/16 0554  Weight: 79.2 kg (174 lb 8 oz) 79.1 kg (174 lb 6.4 oz) 76.4 kg (168 lb 6.4 oz)    Exam:   General:  NAD, thin  Cardiovascular: sinus tachycardia  Respiratory: diminished breath sounds on right, clear on left, right  pleurex catheter   Abdomen: No pain   Musculoskeletal: No Edema  Neuro: aaox3  Data Reviewed: Basic Metabolic Panel:  Recent Labs Lab 01/29/16 0357 01/30/16 0335 01/31/16 1235 02/02/16 0540 02/03/16 0241 02/04/16 0308  NA 137 139 138 137 138 137  K 3.6 3.9 3.8 4.0 3.8 3.6  CL 103 103 106 104 105 104  CO2 '29 29 25 27 30 30  '$ GLUCOSE 117* 99 121* 120* 99 83  BUN '9 9 9  '$ <5* 5* 7  CREATININE 0.70 0.73 0.80 0.65 0.65 0.72  CALCIUM 8.0* 8.3* 8.7* 8.1* 8.0* 8.1*  MG 2.2  --   --   --   --   --    Liver Function Tests:  Recent Labs Lab 01/31/16 1235 02/03/16 0241  AST 50* 21  ALT 61 28  ALKPHOS 74 60  BILITOT 0.4 0.4  PROT 6.6 5.3*  ALBUMIN 2.6* 2.0*   No results for input(s): LIPASE, AMYLASE in the last 168 hours. No results for input(s): AMMONIA in the last 168 hours. CBC:  Recent Labs Lab 01/30/16 0335 01/31/16 1235 02/02/16 0540 02/03/16 0241 02/04/16 0308  WBC 7.6 8.8 8.9 8.2 6.1  HGB 10.8* 12.5* 10.7* 10.8* 10.6*  HCT 35.6* 39.6 34.9* 35.4* 34.8*  MCV 93.4 92.7 93.3 93.9 93.5  PLT 310 354 289 301 309   Cardiac Enzymes:   No results for input(s): CKTOTAL, CKMB, CKMBINDEX, TROPONINI in the last 168 hours. BNP (last 3 results) No results for input(s): BNP in the last 8760 hours.  ProBNP (last 3 results) No results for input(s): PROBNP in the last 8760 hours.  CBG: No results for input(s): GLUCAP in the last 168 hours.  Recent Results (from the past 240 hour(s))  Culture, body fluid-bottle     Status: None   Collection Time: 01/25/16 12:44 PM  Result Value Ref Range Status   Specimen Description FLUID PLEURAL  Final   Special Requests NONE  Final   Culture NO GROWTH 5 DAYS  Final   Report Status 01/30/2016 FINAL  Final  Gram stain     Status: None   Collection Time: 01/25/16 12:44 PM  Result Value Ref Range Status   Specimen Description FLUID PLEURAL  Final   Special Requests NONE  Final   Gram Stain   Final    FEW WBC PRESENT,BOTH PMN AND MONONUCLEAR NO ORGANISMS SEEN    Report Status 01/25/2016 FINAL  Final  MRSA PCR Screening     Status: None   Collection Time: 01/25/16  3:25 PM  Result Value Ref Range Status   MRSA by PCR NEGATIVE NEGATIVE Final    Comment:        The GeneXpert MRSA Assay (FDA approved for NASAL specimens only), is one component of a comprehensive MRSA colonization surveillance program. It is  not intended to diagnose MRSA infection nor to guide or monitor treatment for MRSA infections.   Culture, body fluid-bottle     Status: None (Preliminary result)   Collection Time: 01/30/16 10:27 AM  Result Value Ref Range Status   Specimen Description FLUID RIGHT PLEURAL  Final   Special Requests NONE  Final   Culture NO GROWTH 4 DAYS  Final   Report Status PENDING  Incomplete  Gram stain     Status: None   Collection Time: 01/30/16 10:27 AM  Result Value Ref Range Status   Specimen Description FLUID RIGHT PLEURAL  Final   Special Requests NONE  Final   Gram Stain   Final    ABUNDANT WBC PRESENT,BOTH PMN AND MONONUCLEAR NO ORGANISMS SEEN    Report Status 01/30/2016 FINAL  Final  Culture, body fluid-bottle     Status: None (Preliminary result)   Collection Time: 02/01/16  8:38 AM  Result Value Ref Range Status   Specimen Description FLUID PLEURAL RIGHT  Final   Special Requests Roger Barnes ON FOLLOWING [facility]INACEF  Final   Culture NO GROWTH 2 DAYS  Final   Report Status PENDING  Incomplete  Gram stain     Status: None   Collection Time: 02/01/16  8:38 AM  Result Value Ref Range Status   Specimen Description FLUID PLEURAL RIGHT  Final   Special Requests Roger Barnes ON FOLLOWING ZINACEF  Final   Gram Stain   Final    FEW WBC PRESENT, PREDOMINANTLY MONONUCLEAR NO ORGANISMS SEEN    Report Status 02/01/2016 FINAL  Final     Studies: No results found.  Scheduled Meds: . acetaminophen  1,000 mg Oral Q6H   Or  . acetaminophen (TYLENOL) oral liquid 160 mg/5 mL  1,000 mg Oral Q6H  . bisacodyl  10 mg Oral Daily  . feeding supplement (ENSURE ENLIVE)  237 mL Oral BID BM  . levalbuterol  0.63 mg Nebulization TID  . metoprolol tartrate  25 mg Oral BID  . polyethylene glycol  17 g Oral BID  . senna-docusate  1 tablet Oral QHS  . sodium chloride flush  3 mL Intravenous Q12H    Continuous Infusions:     Time spent: 88mns  RElmarie ShileyMD [facility] Pager [phone]  If 7PM-7AM, please contact night-coverage at [email or web address], password [email or web address] Inc9/[email or web address], 7:19 AM  LOS: 9 days

## 2016-02-04 NOTE — Progress Notes (Signed)
Patient welcomed to the unit, oriented to room and equipment, vs stable, telemetry applied, CCMD notified, and call bell within reach. Betha Loa Cuyler Vandyken, RN

## 2016-02-05 ENCOUNTER — Encounter: Payer: Self-pay | Admitting: *Deleted

## 2016-02-05 ENCOUNTER — Inpatient Hospital Stay (HOSPITAL_COMMUNITY): Payer: Medicare HMO

## 2016-02-05 MED ORDER — BISACODYL 5 MG PO TBEC: 10.0000 mg | DELAYED_RELEASE_TABLET | Freq: Every day | ORAL | 0 refills | Status: DC

## 2016-02-05 MED ORDER — TRAMADOL HCL 50 MG PO TABS: 50.0000 mg | ORAL_TABLET | Freq: Four times a day (QID) | ORAL | 0 refills | Status: DC | PRN

## 2016-02-05 MED ORDER — ACETAMINOPHEN 325 MG PO TABS: 650.0000 mg | ORAL_TABLET | Freq: Four times a day (QID) | ORAL | 0 refills | Status: AC | PRN

## 2016-02-05 MED ORDER — ENSURE ENLIVE PO LIQD: 237.0000 mL | Freq: Two times a day (BID) | ORAL | 12 refills | Status: AC

## 2016-02-05 MED ORDER — LEVALBUTEROL HCL 0.63 MG/3ML IN NEBU: 0.6300 mg | INHALATION_SOLUTION | Freq: Two times a day (BID) | RESPIRATORY_TRACT | 12 refills | Status: DC

## 2016-02-05 MED ORDER — HYDROCODONE-ACETAMINOPHEN 5-325 MG PO TABS: 1.0000 | ORAL_TABLET | ORAL | 0 refills | Status: DC | PRN

## 2016-02-05 MED ORDER — METOPROLOL TARTRATE 25 MG PO TABS: 25.0000 mg | ORAL_TABLET | Freq: Two times a day (BID) | ORAL | 0 refills | Status: DC

## 2016-02-05 MED ORDER — POLYETHYLENE GLYCOL 3350 17 G PO PACK: 17.0000 g | PACK | Freq: Every day | ORAL | 0 refills | Status: AC | PRN

## 2016-02-05 NOTE — Discharge Summary (Signed)
Physician Discharge Summary  Roger Barnes MWN:[id number] DOB: 24-Oct-1960 DOA: 01/25/2016  PCP: No PCP Per Patient  Admit date: 01/25/2016 Discharge date: 02/05/2016  Admitted From: Home  Disposition:  Home   Recommendations for Outpatient Follow-up:  1. Follow up with PCP in 1-2 weeks 2. Please obtain BMP/CBC in one week 3. Please follow up on the following pending results: molecular testing pending.  4. Follow up with Dr Julien Nordmann and CVTS for tx lung cancer.     Discharge Condition: Stable.  CODE STATUS: Full code.  Diet recommendation: Heart Healthy / Carb Modified   Brief/Interim Summary: Roger Montilla Johnsonis a 55 y.o.malewith no significant PMH. Patient has not seen a healthcare provider in many years.Roger Barnes no over-the-counter or prescription medications. Approximately 2 weeks ago patient began to get short of breath with just minimal exertion..He has intermittent sharp pain in right scapula. No chest pain. Patient does complain of upper abdominal discomfort described as a bloated sensation. No nausea or vomiting but appetite has been suboptimal. Patient thinks he is also 5-10 pounds over the last couple of weeks. No cough . No fevers or night sweats . Chest x-ray shows large right pleural effusion. Patient has a history of smoking approximately 10 years ago.   He is found to have opacification of the right lung with large right pleural effusion with shift of the mediastinal to the left   Assessment/Plan: Active Problems:   Pleural effusion, right   Pleural effusion   Elevated d-dimer   Abnormal EKG   Acute respiratory failure with hypoxia (HCC)   Atelectasis   Adenocarcinoma, lung (HCC)  Large pleural effusion, Malignant: Adenocarcinoma lung, occupying most of the right hemithorax with mediastinum shift and liver shift to the left.  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  S/P thoracentesis 8/30,  2 L removed.  -Pulmonology and IR input appreciated.  -Dr Julien Nordmann will arrange outpatient follow up.  -CVTS consulted for evaluation for Pleur-x catheter placement.  -Appreciate Dr Prescott Gum. Patient S/P  VATS 9-01  with chemical pleurodesis and Pleur catheter placement.  -Pneumothorax 50 %,  Management per CVTS.  -patient aware that he will need to follow up with Dr Julien Nordmann for tx.  -chest x ray stable.  -pleurex cath drain on daily basis, [facility] nurse ordered.  -will provide short course for Vicodin and tramadol. Patient understand he should not take this medications together.   Tachycardia;  Related to number one.  Metoprolol.   Leukocytosis: Resolved.   from stress? Does not look septic, pleural fluids no organism seen, culture no growth so far, stop empiric abx. Wbc normalized.  AKI: cr 1.32 on admission, cr normalized with ivf.  Acute blood loss anemia; post procedure, expected. Hb stable at 10.   Constipation;continue with miralax, had BM 9-03   Discharge Diagnoses:  Active Problems:   Pleural effusion, right   Pleural effusion   Elevated d-dimer   Abnormal EKG   Acute respiratory failure with hypoxia (HCC)   Atelectasis   Adenocarcinoma, lung (HCC)    Discharge Instructions  Discharge Instructions    Diet - low sodium heart healthy    Complete by:  As directed   Increase activity slowly    Complete by:  As directed       Medication List    STOP taking these medications   aspirin EC 81  MG tablet     TAKE these medications   acetaminophen 325 MG tablet Commonly known as:  TYLENOL Take 2 tablets (650 mg total) by mouth every 6 (six) hours as needed for mild pain (or Fever >/= 101).   bisacodyl 5 MG EC tablet Commonly known as:  DULCOLAX Take 2 tablets (10 mg total) by mouth daily.   feeding supplement (ENSURE ENLIVE) Liqd Take 237 mLs by  mouth 2 (two) times daily between meals.   HYDROcodone-acetaminophen 5-325 MG tablet Commonly known as:  NORCO/VICODIN Take 1-2 tablets by mouth every 4 (four) hours as needed for moderate pain.   levalbuterol 0.63 MG/3ML nebulizer solution Commonly known as:  XOPENEX Take 3 mLs (0.63 mg total) by nebulization 2 (two) times daily.   metoprolol tartrate 25 MG tablet Commonly known as:  LOPRESSOR Take 1 tablet (25 mg total) by mouth 2 (two) times daily.   multivitamin with minerals tablet Take 1 tablet by mouth daily.   polyethylene glycol packet Commonly known as:  MIRALAX / GLYCOLAX Take 17 g by mouth daily as needed for mild constipation.   traMADol 50 MG tablet Commonly known as:  ULTRAM Take 1 tablet (50 mg total) by mouth every 6 (six) hours as needed (mild pain).      Follow-up Information    Eilleen Kempf., MD .   Specialty:  Oncology Contact information: [place] [address] [address] Adelene Idler, MD .   Specialty:  Cardiothoracic Surgery Why:  PA/LAT CXR to be taken one hour prior to office appointment with Dr. Prescott Gum. Office will mail or call with appointment date and time Contact information: [address] [place] [address] [address] .   Why:  Please drain righ Pleur X everyday and record output         Allergies  Allergen Reactions  . No Known Allergies     Consultations:  CVTS  Oncology    Procedures/Studies: Dg Chest 1 View  Result Date: 01/27/2016 CLINICAL DATA:  Status post RIGHT thoracentesis EXAM: CHEST 1 VIEW COMPARISON:  CT 01/25/2016, radiograph [id number] FINDINGS: Large volume of fluid occupies the near entirety of the RIGHT hemi thorax. Small amount of aerated lung adjacent the RIGHT hilum is increased from radiograph 01/25/2016. No mediastinal shift. LEFT lung is clear. No pneumothorax. IMPRESSION: Minimal expansion of aerated RIGHT lung adjacent  to the RIGHT hilum following thoracentesis. Persistent large volume fluid occupying the near entirety of the RIGHT hemi thorax. No pneumothorax appreciated. Electronically Signed   By: Suzy Bouchard M.D.   On: 01/27/2016 13:27   Dg Chest 1 View  Result Date: 01/25/2016 CLINICAL DATA:  Status post thoracentesis with 2 L removed. EXAM: CHEST 1 VIEW COMPARISON:  Chest x-ray from earlier same day. FINDINGS: Despite the thoracentesis, there remains complete opacification the right hemi thorax. There is decreased leftward shift of the heart compatible with the removal of 2 L of fluid from the right chest. Left lung remains clear. IMPRESSION: 1. Persistent complete opacification of the right hemithorax status post thoracentesis, presumably a large amount of residual effusion. 2. Decreased leftward shift of the heart compatible with the interval removal of 2 L of fluid from the right chest. Electronically Signed   By: Franki Cabot M.D.   On: 01/25/2016 13:01   Dg Chest 2 View  Result Date: 02/04/2016 CLINICAL DATA:  Pneumothorax  EXAM: CHEST  2 VIEW COMPARISON:  02/03/2016 FINDINGS: Right hydropneumothorax. The small to moderate pneumothorax component is considered un ex vacuo pneumothorax due to lack of re-expansion of the abnormal right lung. There is an increasing moderate to large pleural fluid component with multiple air-fluid levels, suggesting loculation. There has been interval removal of the apical chest tube. Stable indwelling basilar pleurodesis catheter. Left lung is essentially clear. The heart is normal in size. IMPRESSION: Right hydropneumothorax with increasing pleural fluid component and multiple air-fluid levels, suggesting loculation. Underlying abnormal right lung with lack of re-expansion secondary to tumor. Stable indwelling pleurodesis catheter. Electronically Signed   By: Julian Hy M.D.   On: 02/04/2016 08:33   Dg Chest 2 View  Result Date: 02/01/2016 CLINICAL DATA:  Evaluate  pleural effusion EXAM: CHEST  2 VIEW COMPARISON:  PA lateral chest x-ray of January 30, 2016 FINDINGS: There remains a large left pleural effusion. Further decrease in the amount of aerated right lung is observed and likely less than 10% of the lung is now aerated. There is no mediastinal shift. The left lung is well-expanded. Interstitial markings on the left are mildly prominent. And no left pleural effusion is observed. The left heart border is normal. The pulmonary vascularity is not engorged. IMPRESSION: Further interval increase in the large right pleural effusion with only a small amount of aerated right lung remaining. Electronically Signed   By: David  Martinique M.D.   On: 02/01/2016 07:07   Dg Chest 2 View  Result Date: 01/30/2016 CLINICAL DATA:  Pleural effusion status post thoracentesis. EXAM: CHEST  2 VIEW COMPARISON:  01/29/2016, 01/27/2016 FINDINGS: Normal cardiac silhouette. Mild expansion of the RIGHT lower lobe following thoracentesis. Minimal expansion of RIGHT upper lobe. Large RIGHT effusion remains. LEFT lung is clear knee No pneumothorax. IMPRESSION: Mild expansion of the RIGHT lower lobe following thoracentesis. No pneumothorax. Large RIGHT effusion remains. Electronically Signed   By: Suzy Bouchard M.D.   On: 01/30/2016 11:04   Dg Chest 2 View  Result Date: 01/29/2016 CLINICAL DATA:  Status post thoracentesis on January 27, 2016. Persistent shortness of breath. No chest pain. EXAM: CHEST  2 VIEW COMPARISON:  Portable chest x-ray of January 27, 2016 FINDINGS: There remains near total opacification of the right hemi thorax. A small amount of aerated lung is noted in the right paratracheal region. The left lung is well-expanded. There is no focal infiltrate. There is no significant mediastinal shift. The left heart border is normal. The pulmonary vascularity is not engorged. IMPRESSION: Little change in the appearance of the nearly totally opacified right hemi thorax. A large pleural  effusion likely remains. There is no pneumothorax. The left lung is clear. Electronically Signed   By: David  Martinique M.D.   On: 01/29/2016 07:55   Ct Angio Chest Pe W Or Wo Contrast  Result Date: 01/25/2016 CLINICAL DATA:  Shortness of Breath EXAM: CT ANGIOGRAPHY CHEST WITH CONTRAST TECHNIQUE: Multidetector CT imaging of the chest was performed using the standard protocol during bolus administration of intravenous contrast. Multiplanar CT image reconstructions and MIPs were obtained to evaluate the vascular anatomy. CONTRAST:  100 mL Isovue-300 nonionic COMPARISON:  Chest radiograph January 25, 2016 FINDINGS: Cardiovascular: No pulmonary embolus is evident. Note that there is extensive compressive atelectasis on the right limiting pulmonary arterial visualization on the right. The ascending thoracic aortic diameter is 4.0 x 3.9 cm. There is no thoracic aortic dissection. The visualized great vessels appear normal. There is no appreciable pericardial effusion. Note that  the heart and mediastinum are shifted to the left due to large pleural effusion present. Mediastinum/Nodes: There is no evident adenopathy. Thyroid appears unremarkable except for a single benign-appearing calcification in the superior left lobe region. Lungs/Pleura: There is a large pleural effusion occupying most of the right hemi thorax. This effusion the shifting the mediastinum and heart toward the left. There is compressive atelectasis and consolidation throughout the right lung. There is only a minimal amount of aeration on the right superiorly. On the left, there are multiple cysts nodular opacities scattered throughout the lung. The largest of these nodular opacities is in the posterior segment of the left lower lobe measuring 1.6 x 1.2 cm. Other nodular opacities range in size from as small as 4 mm to as large as 1.3 cm. There is no edema or consolidation on the left. Upper Abdomen: Subpulmonic component to the effusion displaces the  liver toward the left. Visualized upper abdominal structures otherwise appear unremarkable. Musculoskeletal: There are no demonstrable blastic or lytic bone lesions. Review of the MIP images confirms the above findings. IMPRESSION: No evident pulmonary embolus. Ascending thoracic aorta measures 4.0 x 3.9 cm in diameter. Recommend annual imaging followup by CTA or MRA. This recommendation follows 2010 ACCF/AHA/AATS/ACR/ASA/SCA/SCAI/SIR/STS/SVM Guidelines for the Diagnosis and Management of Patients with Thoracic Aortic Disease. Circulation. 2010; 121: D532-D924. No thoracic aortic dissection evident. Large pleural effusion on the right causing shift of heart and mediastinum as well as liver toward the left. There is extensive compressive atelectasis in the right lung with potential underlying consolidation as well. Only a minimal amount of right lung is aerated. On the left, there are multiple nodular opacities concerning for metastatic foci. Largest of these lesions is in the posterior segment left lower lobe measuring 1.6 x 1.2 cm. There is no left lung edema or consolidation. No adenopathy is evident. Electronically Signed   By: Lowella Grip III M.D.   On: 01/25/2016 13:51   Mr Jeri Cos QA Contrast  Result Date: 01/30/2016 CLINICAL DATA:  Malignant pleural effusion.  Suspected Lung cancer. EXAM: MRI HEAD WITHOUT AND WITH CONTRAST TECHNIQUE: Multiplanar, multiecho pulse sequences of the brain and surrounding structures were obtained without and with intravenous contrast. CONTRAST:  26m MULTIHANCE GADOBENATE DIMEGLUMINE 529 MG/ML IV SOLN COMPARISON:  None. FINDINGS: No evidence for acute infarction, hemorrhage, mass lesion, hydrocephalus, or extra-axial fluid. Premature for age cerebral and cerebellar atrophy. Mild subcortical and periventricular T2 and FLAIR hyperintensities, likely chronic microvascular ischemic change. Pituitary, pineal, and cerebellar tonsils unremarkable. No upper cervical lesions.  Flow voids are maintained throughout the carotid, basilar, and vertebral arteries. There are no areas of chronic hemorrhage. Post infusion, no abnormal enhancement of the brain or meninges. Visualized calvarium, skull base, and upper cervical osseous structures unremarkable. Scalp and extracranial soft tissues, orbits, sinuses, and mastoids show no acute process. IMPRESSION: Negative for intracranial metastatic disease. Mild atrophy and small vessel disease. Electronically Signed   By: JStaci RighterM.D.   On: 01/30/2016 21:42   Ct Abdomen Pelvis W Contrast  Result Date: 01/25/2016 CLINICAL DATA:  Shortness of breath with abdominal pain and tightness EXAM: CT ABDOMEN AND PELVIS WITH CONTRAST TECHNIQUE: Multidetector CT imaging of the abdomen and pelvis was performed using the standard protocol following bolus administration of intravenous contrast. CONTRAST:  100 mL Omnipaque 300 nonionic COMPARISON:  CT angiogram chest obtained earlier in the day FINDINGS: Lower chest: Nodular lesions are noted in the left lung base, largest measuring 1.6 x 1.3 cm, also seen on  CT angiogram chest earlier in the day. On the right, there is consolidation and compressive atelectasis with large right effusion causing shift of heart and mediastinum toward the left. Hepatobiliary: The liver is displaced toward the left due to the subpulmonic component of the large right pleural effusion. No intrinsic liver lesions are evident. Gallbladder wall is not appreciably thickened. There is no biliary duct dilatation. Pancreas: No pancreatic mass or inflammatory focus is evident. Spleen: No splenic lesions are evident. Adrenals/Urinary Tract: Adrenals appear unremarkable bilaterally. There are small cysts in the periphery of the left kidney, largest measuring 0.9 x 0.8 cm. There is no hydronephrosis on either side. There are no demonstrable renal or ureteral calculi on either side. The urinary bladder is midline with wall thickness within  normal limits. Stomach/Bowel: There are scattered sigmoid diverticula without diverticulitis. There is no bowel wall or mesenteric thickening. There is no bowel obstruction. No free air or portal venous air evident. Vascular/Lymphatic: There are is patchy atherosclerotic calcification in the aorta and common iliac arteries without evidence of abdominal aortic aneurysm. Major mesenteric vessels appear patent. There is no appreciable adenopathy in the abdomen or pelvis. Reproductive: Prostate and seminal vesicles appear normal in size and contour. There is no pelvic mass or pelvic fluid collection. Other: Appendix appears normal. There is no ascites or abscess in the abdomen or pelvis. There are no peritoneal or retroperitoneal/omental type implants. Musculoskeletal: There is degenerative change in the lower lumbar spine. There are no blastic or lytic bone lesions. There is no intramuscular or abdominal wall lesion. IMPRESSION: Nodular lesions noted in the left lung base. These nodular lesions are concerning for metastatic foci. A large right pleural effusion is noted with consolidation and compressive atelectasis throughout the right lung. A potential right-sided lung neoplasm could easily be obscured by these changes. The right effusion causes shift of heart and mediastinum as well as liver toward the left. No liver lesions are evident. No adenopathy. A neoplastic focus is not seen within the abdomen or pelvis. No bowel obstruction or inflammatory focus. No abscess. Appendix appears normal. No ascites. Electronically Signed   By: Lowella Grip III M.D.   On: 01/25/2016 14:01   Dg Chest Port 1 View  Result Date: 02/05/2016 CLINICAL DATA:  Right chest tube. EXAM: PORTABLE CHEST 1 VIEW COMPARISON:  02/04/2016. FINDINGS: Right chest tube in stable position. Stable right sided pneumothorax again noted without interim change. Right pleural effusion most likely present. Pleural fluid is less well identified on  today's exam, this may be secondary to semi-upright positioning the patient. Left lung is clear. Cardiomegaly. No acute bony abnormality. IMPRESSION: Scratched Right chest tube in stable position. Stable right pneumothorax. Right pleural effusion most likely present. Pleural fluid is less well identified on today's exam, this may be secondary to semi-upright positioning of the patient. Electronically Signed   By: Marcello Moores  Register   On: 02/05/2016 07:20   Dg Chest Port 1 View  Result Date: 02/03/2016 CLINICAL DATA:  Pneumothorax EXAM: PORTABLE CHEST 1 VIEW COMPARISON:  02/02/2016 FINDINGS: Stable right apical chest tube with moderate ex vacuo pneumothorax due to lack of re-expansion of the abnormal right upper lobe and right lower lobe secondary to underlying tumor. Associated moderate right pleural effusion, unchanged from the prior, improved from more remote studies. Left lung is clear. The heart is top-normal in size. Stable right IJ venous catheter. Right PleurX catheter is poorly visualized. IMPRESSION: Stable right apical chest tube with moderate ex vacuo pneumothorax due to lack  of re-expansion of the abnormal right lung secondary to underlying tumor. Associated moderate right pleural effusion, unchanged from the recent prior. Electronically Signed   By: Julian Hy M.D.   On: 02/03/2016 07:24   Dg Chest Port 1 View  Result Date: 02/02/2016 CLINICAL DATA:  Adenocarcinoma of the right lung with malignant right pleural effusion and non re-expansion of the right lung. EXAM: PORTABLE CHEST 1 VIEW COMPARISON:  02/01/2016 FINDINGS: Large caliber right-sided chest tube and additional PleurX catheter remain. There is some improved expansion of the right lower lung. There remains significant component of lack of re-expansion of the upper lobe is secondary to tumor and ex vacuo pneumothorax after pleural fluid evacuation. The left lung remains clear. Stable positioning of central line. IMPRESSION: Some  improved expansion of the right lower lung noted compared to the prior chest x-ray. There remains lack of re-expansion of the right upper lobe secondary to malignancy and therefore the remaining pneumothorax remains an ex vacuo pneumothorax. Electronically Signed   By: Aletta Edouard M.D.   On: 02/02/2016 08:11   Dg Chest Port 1 View  Result Date: 02/01/2016 CLINICAL DATA:  Status post VATS EXAM: PORTABLE CHEST 1 VIEW COMPARISON:  None. FINDINGS: There is a large caliber right-sided chest tube in place along with a small caliber PleurX drainage type catheter. Despite this there is a large right pneumothorax. The right IJ catheter tip is in the distal SVC. The left lung is relatively clear. IMPRESSION: Right-sided chest tubes in good position but there is a 50% pneumothorax. Right IJ catheter tip is in the distal SVC. These results were called by telephone at the time of interpretation on 02/01/2016 at 10:22 am to Dr. Ivin Poot , who verbally acknowledged these results. Electronically Signed   By: Marijo Sanes M.D.   On: 02/01/2016 10:23   Dg Chest Port 1 View  Result Date: 01/25/2016 CLINICAL DATA:  Shortness of breath. EXAM: PORTABLE CHEST 1 VIEW COMPARISON:  No prior . FINDINGS: Complete opacification of the right hemi thorax with shift of the mediastinum and heart to the left noted. Findings consist with large right pleural effusion. Underlying pulmonary disease cannot be excluded. Left lung is clear. No pneumothorax . IMPRESSION: Complete opacification of the right hemi thorax with shift of the mediastinum and heart to the left. Findings consist with large right pleural effusion. Underlying right lung disease cannot be excluded . Electronically Signed   By: Marcello Moores  Register   On: 01/25/2016 09:41   US Thoracentesis Asp Pleural Space W/img Guide  Result Date: 01/30/2016 INDICATION: Recurrent malignant right pleural effusion. Request repeat thoracentesis. EXAM: ULTRASOUND GUIDED RIGHT  THORACENTESIS MEDICATIONS: None. COMPLICATIONS: None immediate. PROCEDURE: An ultrasound guided thoracentesis was thoroughly discussed with the patient and questions answered. The benefits, risks, alternatives and complications were also discussed. The patient understands and wishes to proceed with the procedure. Written consent was obtained. Ultrasound was performed to localize and mark an adequate pocket of fluid in the right chest. The area was then prepped and draped in the normal sterile fashion. 1% Lidocaine was used for local anesthesia. Under ultrasound guidance a Safe-T-Centesis catheter was introduced. Thoracentesis was performed. The patient complained of significant discomfort and chest tightness on the right side. The procedure was stopped at this point. The catheter was removed and a dressing applied. FINDINGS: A total of approximately 2 L of dark bloody fluid was removed. Samples were sent to the laboratory as requested by the clinical team. Residual effusion remains. IMPRESSION:  Successful ultrasound guided right thoracentesis yielding 2 L of pleural fluid. Read by: Ascencion Dike PA-C Electronically Signed   By: Sandi Mariscal M.D.   On: 01/30/2016 10:45   US Thoracentesis Asp Pleural Space W/img Guide  Result Date: 01/27/2016 INDICATION: Shortness of breath. Large right pleural effusion. Request repeat right thoracentesis. EXAM: ULTRASOUND GUIDED RIGHT THORACENTESIS MEDICATIONS: None. COMPLICATIONS: None immediate. PROCEDURE: An ultrasound guided thoracentesis was thoroughly discussed with the patient and questions answered. The benefits, risks, alternatives and complications were also discussed. The patient understands and wishes to proceed with the procedure. Written consent was obtained. Ultrasound was performed to localize and mark an adequate pocket of fluid in the right chest. The area was then prepped and draped in the normal sterile fashion. 1% Lidocaine was used for local anesthesia. Under  ultrasound guidance a Safe-T-Centesis catheter was introduced. Thoracentesis was performed. The catheter was removed and a dressing applied. FINDINGS: A total of approximately 3 L of bloody pleural fluid was removed. IMPRESSION: Successful ultrasound guided right thoracentesis yielding 3 L of pleural fluid. Read by: Ascencion Dike PA-C Electronically Signed   By: Lucrezia Europe M.D.   On: 01/27/2016 13:11   US Thoracentesis Asp Pleural Space W/img Guide  Result Date: 01/25/2016 INDICATION: New onset shortness of breath with massive right pleural effusion. Request is made for diagnostic and therapeutic thoracentesis EXAM: ULTRASOUND GUIDED DIAGNOSTIC AND THERAPEUTIC THORACENTESIS MEDICATIONS: 1% lidocaine. COMPLICATIONS: None immediate. PROCEDURE: An ultrasound guided thoracentesis was thoroughly discussed with the patient and questions answered. The benefits, risks, alternatives and complications were also discussed. The patient understands and wishes to proceed with the procedure. Written consent was obtained. Ultrasound was performed to localize and mark an adequate pocket of fluid in the right chest. The area was then prepped and draped in the normal sterile fashion. 1% Lidocaine was used for local anesthesia. Under ultrasound guidance a Safe-T-Centesis catheter was introduced. Thoracentesis was performed. The catheter was removed and a dressing applied. FINDINGS: A total of approximately 2 L of serosanguineous fluid was removed. Samples were sent to the laboratory as requested by the clinical team. IMPRESSION: Successful ultrasound guided right thoracentesis yielding 2 L of pleural fluid. A significant effusion remained after the procedure. The procedure was terminated after 2 L which is standard for first time thoracentesis. Read by: Saverio Danker, PA-C Electronically Signed   By: Lucrezia Europe M.D.   On: 01/25/2016 13:06      Subjective: He is feeling well, complaining of pain site catheter . Had small BM    Discharge Exam: Vitals:   02/05/16 0500 02/05/16 0746  BP: 106/63   Pulse: 75 98  Resp: 19 16  Temp: 97.9 F (36.6 C)    Vitals:   02/04/16 2245 02/05/16 0434 02/05/16 0500 02/05/16 0746  BP: 113/87 107/70 106/63   Pulse: (!) 115 98 75 98  Resp:  '19 19 16  '$ Temp:  98.1 F (36.7 C) 97.9 F (36.6 C)   TempSrc:  Oral Oral   SpO2:  96% 97% 97%  Weight:      Height:        General: Pt is alert, awake, not in acute distress Cardiovascular: RRR, S1/S2 +, no rubs, no gallops Respiratory: CTA bilaterally, no wheezing, no rhonchi Abdominal: Soft, NT, ND, bowel sounds + Extremities: no edema, no cyanosis    The results of significant diagnostics from this hospitalization (including imaging, microbiology, ancillary and laboratory) are listed below for reference.     Microbiology: Recent Results (from  the past 240 hour(s))  Culture, body fluid-bottle     Status: None   Collection Time: 01/30/16 10:27 AM  Result Value Ref Range Status   Specimen Description FLUID RIGHT PLEURAL  Final   Special Requests NONE  Final   Culture NO GROWTH 5 DAYS  Final   Report Status 02/04/2016 FINAL  Final  Gram stain     Status: None   Collection Time: 01/30/16 10:27 AM  Result Value Ref Range Status   Specimen Description FLUID RIGHT PLEURAL  Final   Special Requests NONE  Final   Gram Stain   Final    ABUNDANT WBC PRESENT,BOTH PMN AND MONONUCLEAR NO ORGANISMS SEEN    Report Status 01/30/2016 FINAL  Final  Culture, body fluid-bottle     Status: None (Preliminary result)   Collection Time: 02/01/16  8:38 AM  Result Value Ref Range Status   Specimen Description FLUID PLEURAL RIGHT  Final   Special Requests PATIENT ON FOLLOWING ZINACEF  Final   Culture NO GROWTH 3 DAYS  Final   Report Status PENDING  Incomplete  Gram stain     Status: None   Collection Time: 02/01/16  8:38 AM  Result Value Ref Range Status   Specimen Description FLUID PLEURAL RIGHT  Final   Special Requests PATIENT ON  FOLLOWING ZINACEF  Final   Gram Stain   Final    FEW WBC PRESENT, PREDOMINANTLY MONONUCLEAR NO ORGANISMS SEEN    Report Status 02/01/2016 FINAL  Final     Labs: BNP (last 3 results) No results for input(s): BNP in the last 8760 hours. Basic Metabolic Panel:  Recent Labs Lab 01/30/16 0335 01/31/16 1235 02/02/16 0540 02/03/16 0241 02/04/16 0308  NA 139 138 137 138 137  K 3.9 3.8 4.0 3.8 3.6  CL 103 106 104 105 104  CO2 '29 25 27 30 30  '$ GLUCOSE 99 121* 120* 99 83  BUN 9 9 <5* 5* 7  CREATININE 0.73 0.80 0.65 0.65 0.72  CALCIUM 8.3* 8.7* 8.1* 8.0* 8.1*   Liver Function Tests:  Recent Labs Lab 01/31/16 1235 02/03/16 0241  AST 50* 21  ALT 61 28  ALKPHOS 74 60  BILITOT 0.4 0.4  PROT 6.6 5.3*  ALBUMIN 2.6* 2.0*   No results for input(s): LIPASE, AMYLASE in the last 168 hours. No results for input(s): AMMONIA in the last 168 hours. CBC:  Recent Labs Lab 01/30/16 0335 01/31/16 1235 02/02/16 0540 02/03/16 0241 02/04/16 0308  WBC 7.6 8.8 8.9 8.2 6.1  HGB 10.8* 12.5* 10.7* 10.8* 10.6*  HCT 35.6* 39.6 34.9* 35.4* 34.8*  MCV 93.4 92.7 93.3 93.9 93.5  PLT 310 354 289 301 309   Cardiac Enzymes: No results for input(s): CKTOTAL, CKMB, CKMBINDEX, TROPONINI in the last 168 hours. BNP: Invalid input(s): POCBNP CBG: No results for input(s): GLUCAP in the last 168 hours. D-Dimer No results for input(s): DDIMER in the last 72 hours. Hgb A1c No results for input(s): HGBA1C in the last 72 hours. Lipid Profile No results for input(s): CHOL, HDL, LDLCALC, TRIG, CHOLHDL, LDLDIRECT in the last 72 hours. Thyroid function studies No results for input(s): TSH, T4TOTAL, T3FREE, THYROIDAB in the last 72 hours.  Invalid input(s): FREET3 Anemia work up No results for input(s): VITAMINB12, FOLATE, FERRITIN, TIBC, IRON, RETICCTPCT in the last 72 hours. Urinalysis    Component Value Date/Time   COLORURINE YELLOW 01/31/2016 1401   APPEARANCEUR CLEAR 01/31/2016 1401   LABSPEC  1.024 01/31/2016 1401   PHURINE 5.5 01/31/2016  [place] 01/31/2016 1401   HGBUR NEGATIVE 01/31/2016 1401   BILIRUBINUR NEGATIVE 01/31/2016 1401   KETONESUR NEGATIVE 01/31/2016 1401   PROTEINUR NEGATIVE 01/31/2016 1401   NITRITE NEGATIVE 01/31/2016 1401   LEUKOCYTESUR NEGATIVE 01/31/2016 1401   Sepsis Labs Invalid input(s): PROCALCITONIN,  WBC,  LACTICIDVEN Microbiology Recent Results (from the past 240 hour(s))  Culture, body fluid-bottle     Status: None   Collection Time: 01/30/16 10:27 AM  Result Value Ref Range Status   Specimen Description FLUID RIGHT PLEURAL  Final   Special Requests NONE  Final   Culture NO GROWTH 5 DAYS  Final   Report Status 02/04/2016 FINAL  Final  Gram stain     Status: None   Collection Time: 01/30/16 10:27 AM  Result Value Ref Range Status   Specimen Description FLUID RIGHT PLEURAL  Final   Special Requests NONE  Final   Gram Stain   Final    ABUNDANT WBC PRESENT,BOTH PMN AND MONONUCLEAR NO ORGANISMS SEEN    Report Status 01/30/2016 FINAL  Final  Culture, body fluid-bottle     Status: None (Preliminary result)   Collection Time: 02/01/16  8:38 AM  Result Value Ref Range Status   Specimen Description FLUID PLEURAL RIGHT  Final   Special Requests PATIENT ON FOLLOWING ZINACEF  Final   Culture NO GROWTH 3 DAYS  Final   Report Status PENDING  Incomplete  Gram stain     Status: None   Collection Time: 02/01/16  8:38 AM  Result Value Ref Range Status   Specimen Description FLUID PLEURAL RIGHT  Final   Special Requests PATIENT ON FOLLOWING ZINACEF  Final   Gram Stain   Final    FEW WBC PRESENT, PREDOMINANTLY MONONUCLEAR NO ORGANISMS SEEN    Report Status 02/01/2016 FINAL  Final     Time coordinating discharge: Over 30 minutes  SIGNED:   Elmarie Shiley, MD  [facility] 02/05/2016, 9:24 AM Pager [phone]  If 7PM-7AM, please contact night-coverage [email or web address] Password [email or web address]

## 2016-02-05 NOTE — Care Management Note (Signed)
Case Management Note Previous CM note initiated by Bethena Roys, RN 01/31/2016, 12:21 PM   Patient Details  Name: Roger Barnes MRN: [id number] Date of Birth: Oct 26, 1960  Subjective/Objective: Pt  Presented for worsening dyspnea, right-sided chest pain and weight loss. Chest x-ray showed opacification of the right lung with shift of the mediastinal to the left- Right pleural effusion. Pt post  thoracentesis 8-25 (2L) removed, 8-27 (3L) removed, and 8-30-(2L) removed. 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               Action/Plan: CM did speak with pt in regards to disposition post d/c. Plan at this time will be for home post VATS procedure scheduled for 02-01-16. CM did offer choice and pt chose [facility]. CM did make referral with Santiago Glad with [facility] and SOC to begin within 24-48 hours post d/c. No further needs from CM at this time. Unit CM will continue to monitor to see if plan changes.   Expected Discharge Date:  02/05/16               Expected Discharge Plan:  [place]  In-House Referral:  NA  Discharge planning Services  CM Consult  Post Acute Care Choice:  Home Health Choice offered to:  Patient  DME Arranged:    DME Agency:     HH Arranged:  RN [place] Agency:  [place]  Status of Service:  Completed, signed off  If discussed at [place] of Stay Meetings, dates discussed:    Additional Comments:  02/05/16- 1030- Kebin Maye RN, CM- pt s/p PleurX cath placement on 9/4- paperwork has been filled out and signed by MD- paperwork faxed to [facility] and originals will be mailed- referral for [facility] services had been previously made to [facility] see other CM note- Order placed for [facility]- for pleurX cath drainage daily. - spoke with Santiago Glad at [facility] this AM regarding d/c needs and [facility] services. Bedside RN working on ordering PleurX cath drainage kits to send home with pt.   Dahlia Client [place], RN 02/05/2016, 10:26  AM [phone]

## 2016-02-05 NOTE — Progress Notes (Addendum)
      [address]       [employer] [address]             [phone]       4 Days Post-Op Procedure(s) (LRB): VIDEO ASSISTED THORACOSCOPY (Right) INSERTION PLEURAL DRAINAGE CATHETER (Right) DOXYCYCLINE PLEURADESIS (Right) VIDEO BRONCHOSCOPY (N/A)  Subjective: Patient eating breakfast this am.  Objective: Vital signs in last 24 hours: Temp:  [97.9 F (36.6 C)-98.1 F (36.7 C)] 97.9 F (36.6 C) (09/05 0500) Pulse Rate:  [75-115] 98 (09/05 0746) Cardiac Rhythm: Normal sinus rhythm (09/05 0700) Resp:  [16-20] 16 (09/05 0746) BP: (102-120)/(63-87) 106/63 (09/05 0500) SpO2:  [96 %-100 %] 97 % (09/05 0746)      Intake/Output from previous day: 09/04 0701 - 09/05 0700 In: 1350 [P.O.:1350] Out: 450 [Chest Tube:450]   Physical Exam:  Cardiovascular: Tachy Pulmonary: Clear to auscultation on leftt and diminished right apex. Wounds: Dressing is clean and dry.     Lab Results: CBC:  Recent Labs  02/03/16 0241 02/04/16 0308  WBC 8.2 6.1  HGB 10.8* 10.6*  HCT 35.4* 34.8*  PLT 301 309   BMET:   Recent Labs  02/03/16 0241 02/04/16 0308  NA 138 137  K 3.8 3.6  CL 105 104  CO2 30 30  GLUCOSE 99 83  BUN 5* 7  CREATININE 0.65 0.72  CALCIUM 8.0* 8.1*    PT/INR:  No results for input(s): LABPROT, INR in the last 72 hours. ABG:  INR: Will add last result for INR, ABG once components are confirmed Will add last 4 CBG results once components are confirmed  Assessment/Plan:  1. CV - Tachy with low 110's. On Lopressor 25 mg bid. . 2.  Pulmonary - Chest tube removed 09/03. Follow up CXR has shows stable right pneumothorax. Continue to drain right Pleur X daily , record output. Encourage incentive spirometer. 3. Anemia-H and H stable at 10.8 and 35.4 4. Per Dr. Prescott Gum, ok to discharge. Follow up appointment arranged.  ZIMMERMAN,DONIELLE MPA-C 02/05/2016,8:47 AM   R pleurx drainage at home by [facility] mon-wed-fri Pleural bx positive for  adenocarcinoma Surgical incisions clean and dry  patient examined and medical record reviewed,agree with above note. Tharon Aquas Trigt III 02/05/2016

## 2016-02-05 NOTE — Progress Notes (Signed)
Drained pleurex catheter and removed 225 ml. Slow to drain and patient tolerated the procedure well. Patient given case less 2 pleurex catheters to take home. Pt resting with call bell within reach.  Will continue to monitor. Payton Emerald, RN

## 2016-02-05 NOTE — Progress Notes (Signed)
Oncology Nurse Navigator Documentation  Oncology Nurse Navigator Flowsheets 02/05/2016  Navigator Encounter Type Other/I followed up with [facility] today regarding molecular testing with foundation one and PDL 1.    Treatment Phase Pre-Tx/Tx Discussion  Barriers/Navigation Needs Coordination of Care  Interventions Coordination of Care  Coordination of Care Other  Acuity Level 2  Acuity Level 2 Other  Time Spent with Patient 30

## 2016-02-06 ENCOUNTER — Encounter: Payer: Self-pay | Admitting: *Deleted

## 2016-02-06 ENCOUNTER — Telehealth: Payer: Self-pay | Admitting: *Deleted

## 2016-02-06 DIAGNOSIS — C3490 Malignant neoplasm of unspecified part of unspecified bronchus or lung: Secondary | ICD-10-CM

## 2016-02-06 DIAGNOSIS — J910 Malignant pleural effusion: Secondary | ICD-10-CM | POA: Diagnosis not present

## 2016-02-06 LAB — CULTURE, BODY FLUID W GRAM STAIN -BOTTLE: Culture: NO GROWTH

## 2016-02-06 NOTE — Telephone Encounter (Signed)
Oncology Nurse Navigator Documentation  Oncology Nurse Navigator Flowsheets 02/05/2016  Navigator Encounter Type Other/patient is out of the hospital.  I called to set up to see Dr. Julien Nordmann at [facility] on 02/14/16.  Patient verbalized understanding of appt time and place.   Treatment Phase Pre-Tx/Tx Discussion  Barriers/Navigation Needs Coordination of Care  Interventions Coordination of Care  Coordination of Care Other  Acuity Level 2  Acuity Level 2 Other  Time Spent with Patient 30

## 2016-02-08 ENCOUNTER — Telehealth: Payer: Self-pay | Admitting: *Deleted

## 2016-02-08 NOTE — Telephone Encounter (Signed)
Mailed clinic letter to pt.  

## 2016-02-14 ENCOUNTER — Encounter: Payer: Self-pay | Admitting: Internal Medicine

## 2016-02-14 ENCOUNTER — Ambulatory Visit (HOSPITAL_BASED_OUTPATIENT_CLINIC_OR_DEPARTMENT_OTHER): Payer: Medicare HMO | Admitting: Internal Medicine

## 2016-02-14 ENCOUNTER — Encounter: Payer: Self-pay | Admitting: *Deleted

## 2016-02-14 ENCOUNTER — Telehealth: Payer: Self-pay | Admitting: Internal Medicine

## 2016-02-14 ENCOUNTER — Other Ambulatory Visit: Payer: Self-pay | Admitting: Medical Oncology

## 2016-02-14 ENCOUNTER — Other Ambulatory Visit (HOSPITAL_BASED_OUTPATIENT_CLINIC_OR_DEPARTMENT_OTHER): Payer: Medicare HMO

## 2016-02-14 ENCOUNTER — Ambulatory Visit: Payer: Medicare HMO | Attending: Internal Medicine | Admitting: Physical Therapy

## 2016-02-14 VITALS — BP 103/73 | HR 97 | Temp 98.2°F | Resp 17 | Ht 70.0 in | Wt 165.6 lb

## 2016-02-14 DIAGNOSIS — C3432 Malignant neoplasm of lower lobe, left bronchus or lung: Secondary | ICD-10-CM | POA: Diagnosis not present

## 2016-02-14 DIAGNOSIS — J9819 Other pulmonary collapse: Secondary | ICD-10-CM

## 2016-02-14 DIAGNOSIS — R2681 Unsteadiness on feet: Secondary | ICD-10-CM

## 2016-02-14 DIAGNOSIS — C3491 Malignant neoplasm of unspecified part of right bronchus or lung: Secondary | ICD-10-CM

## 2016-02-14 DIAGNOSIS — J90 Pleural effusion, not elsewhere classified: Secondary | ICD-10-CM | POA: Diagnosis not present

## 2016-02-14 DIAGNOSIS — C3490 Malignant neoplasm of unspecified part of unspecified bronchus or lung: Secondary | ICD-10-CM

## 2016-02-14 LAB — CBC WITH DIFFERENTIAL/PLATELET
BASO%: 0.1 % (ref 0.0–2.0)
Basophils Absolute: 0 10*3/uL (ref 0.0–0.1)
EOS ABS: 0.1 10*3/uL (ref 0.0–0.5)
EOS%: 1 % (ref 0.0–7.0)
HCT: 38.7 % (ref 38.4–49.9)
HEMOGLOBIN: 12.5 g/dL — AB (ref 13.0–17.1)
LYMPH%: 10.4 % — ABNORMAL LOW (ref 14.0–49.0)
MCH: 29.4 pg (ref 27.2–33.4)
MCHC: 32.3 g/dL (ref 32.0–36.0)
MCV: 91.1 fL (ref 79.3–98.0)
MONO#: 0.6 10*3/uL (ref 0.1–0.9)
MONO%: 7.2 % (ref 0.0–14.0)
NEUT%: 81.3 % — ABNORMAL HIGH (ref 39.0–75.0)
NEUTROS ABS: 6.2 10*3/uL (ref 1.5–6.5)
Platelets: 360 10*3/uL (ref 140–400)
RBC: 4.25 10*6/uL (ref 4.20–5.82)
RDW: 14.7 % — AB (ref 11.0–14.6)
WBC: 7.7 10*3/uL (ref 4.0–10.3)
lymph#: 0.8 10*3/uL — ABNORMAL LOW (ref 0.9–3.3)

## 2016-02-14 LAB — COMPREHENSIVE METABOLIC PANEL
ALBUMIN: 2.5 g/dL — AB (ref 3.5–5.0)
ALK PHOS: 95 U/L (ref 40–150)
ALT: 17 U/L (ref 0–55)
AST: 25 U/L (ref 5–34)
Anion Gap: 9 mEq/L (ref 3–11)
BILIRUBIN TOTAL: 0.39 mg/dL (ref 0.20–1.20)
BUN: 8.3 mg/dL (ref 7.0–26.0)
CO2: 27 mEq/L (ref 22–29)
CREATININE: 0.9 mg/dL (ref 0.7–1.3)
Calcium: 8.9 mg/dL (ref 8.4–10.4)
Chloride: 104 mEq/L (ref 98–109)
GLUCOSE: 126 mg/dL (ref 70–140)
Potassium: 4.4 mEq/L (ref 3.5–5.1)
SODIUM: 141 meq/L (ref 136–145)
TOTAL PROTEIN: 6.6 g/dL (ref 6.4–8.3)

## 2016-02-14 NOTE — Telephone Encounter (Signed)
Avs report and appointment schedule given to patient, per 02/14/16 los.

## 2016-02-14 NOTE — Progress Notes (Signed)
[place] [phone]   [phone] Multidisciplinary thoracic oncology clinic  CONSULT NOTE  REFERRING PHYSICIAN: Dr. Tharon Aquas Trigt  REASON FOR CONSULTATION:  55 years old white male recently diagnosed with lung cancer.  HPI Ranier Coach is a 55 y.o. male was no significant past medical history except for right clavicular fracture. The patient presented to the emergency department at [facility] on 01/25/2016 complaining of worsening shortness of breath and weakness. Chest x-ray performed at that time showed complete opacification of the right hemithorax with shift of the mediastinum and heart to the left. These findings were consistent with large right pleural effusion. CT angiogram of the chest performed on the same day showed large pleural effusion on the right causing shift of the heart and mediastinum as well as liver towards the left. There was extensive compressive atelectasis in the right lung with potential underlying consolidation as well. On the left there are multiple nodular opacity corresponding to metastatic foci the largest of these lesions was in the posterior segment of the left lower lobe measuring 1.6 x 1.2 cm. There was no left lung edema or consolidation. The patient underwent ultrasound-guided right thoracentesis with drainage of 2.0 L of fluids on 01/25/2016. The final cytology (Accession: [id number]) showed malignant cells, non-small cell lung cancer favoring adenocarcinoma. The tissue block was sent to [facility] one for molecular studies. CT of the abdomen and pelvis on 01/25/2016 showed no metastatic lesions in the abdomen or pelvis. The patient underwent another ultrasound guided right thoracentesis on 01/27/2016 with drainage of 3.0 L of pleural fluid. He was also seen by Dr. Prescott Gum and underwent a video bronchoscopy right VATS with drainage of the pleural fluid with pleurodesis and placement of right Pleurx  catheter on 02/02/2016. MRI of the brain during his hospitalization showed no evidence of metastatic disease to the brain. The patient was referred to the multidisciplinary thoracic oncology clinic today for further evaluation and recommendation regarding treatment of his condition. When seen today he continues to complain of lack of stamina as well as pain on the right side of the chest and shortness breath with exertion with mild cough and no hemoptysis. He lost around 20 pounds in the last 2 months. He has no nausea, vomiting, diarrhea or constipation. He has no headache or visual changes. Family history significant for a father with oral cancer Marcello Moores mother had rickets and hypertension. The patient is single and has no children. He lives with his parents. He used to work as a Engineer, technical sales. He has a history of smoking less than one pack per day for around 30 years and quit 8 years ago. No history of alcohol or drug abuse. HPI  Past Medical History:  Diagnosis Date  . Adenocarcinoma of right lung, stage 4 ([place])   . Pleural effusion, right 01/25/2016    Past Surgical History:  Procedure Laterality Date  . CHEST TUBE INSERTION Right 02/01/2016   Procedure: INSERTION PLEURAL DRAINAGE CATHETER;  Surgeon: Ivin Poot, MD;  Location: [place];  Service: Thoracic;  Laterality: Right;  . PLEURADESIS Right 02/01/2016   Procedure: DOXYCYCLINE PLEURADESIS;  Surgeon: Ivin Poot, MD;  Location: [place];  Service: Thoracic;  Laterality: Right;  . THORACENTESIS Right 01/25/2016  . VIDEO ASSISTED THORACOSCOPY Right 02/01/2016   Procedure: VIDEO ASSISTED THORACOSCOPY;  Surgeon: Ivin Poot, MD;  Location: [place];  Service: Thoracic;  Laterality: Right;  Marland Kitchen VIDEO BRONCHOSCOPY N/A 02/01/2016   Procedure:  VIDEO BRONCHOSCOPY;  Surgeon: Ivin Poot, MD;  Location: [facility] OR;  Service: Thoracic;  Laterality: N/A;  . WISDOM TOOTH EXTRACTION      Family History  Problem Relation Age of Onset  . Cancer Father      oral  . COPD Maternal Grandmother     Social History Social History  Substance Use Topics  . Smoking status: Former Research scientist (life sciences)  . Smokeless tobacco: Never Used     Comment: 01/25/2016 "haven't smoked since <2007"  . Alcohol use No    Allergies  Allergen Reactions  . No Known Allergies     Current Outpatient Prescriptions  Medication Sig Dispense Refill  . acetaminophen (TYLENOL) 325 MG tablet Take 2 tablets (650 mg total) by mouth every 6 (six) hours as needed for mild pain (or Fever >/= 101). 10 tablet 0  . bisacodyl (DULCOLAX) 5 MG EC tablet Take 2 tablets (10 mg total) by mouth daily. 30 tablet 0  . feeding supplement, ENSURE ENLIVE, (ENSURE ENLIVE) LIQD Take 237 mLs by mouth 2 (two) times daily between meals. 237 mL 12  . HYDROcodone-acetaminophen (NORCO/VICODIN) 5-325 MG tablet Take 1-2 tablets by mouth every 4 (four) hours as needed for moderate pain. 30 tablet 0  . levalbuterol (XOPENEX) 0.63 MG/3ML nebulizer solution Take 3 mLs (0.63 mg total) by nebulization 2 (two) times daily. 3 mL 12  . metoprolol tartrate (LOPRESSOR) 25 MG tablet Take 1 tablet (25 mg total) by mouth 2 (two) times daily. 60 tablet 0  . Multiple Vitamins-Minerals (MULTIVITAMIN WITH MINERALS) tablet Take 1 tablet by mouth daily.    . polyethylene glycol (MIRALAX / GLYCOLAX) packet Take 17 g by mouth daily as needed for mild constipation. 14 each 0  . traMADol (ULTRAM) 50 MG tablet Take 1 tablet (50 mg total) by mouth every 6 (six) hours as needed (mild pain). 30 tablet 0   No current facility-administered medications for this visit.     Review of Systems  Constitutional: positive for fatigue and weight loss Eyes: negative Ears, nose, mouth, throat, and face: negative Respiratory: positive for cough and dyspnea on exertion Cardiovascular: negative Gastrointestinal: negative Genitourinary:negative Integument/breast: negative Hematologic/lymphatic: negative Musculoskeletal:negative Neurological:  negative Behavioral/Psych: negative Endocrine: negative Allergic/Immunologic: negative  Physical Exam  PJK:DTOIZ, healthy, no distress, well nourished, well developed and anxious SKIN: skin color, texture, turgor are normal, no rashes or significant lesions HEAD: Normocephalic, No masses, lesions, tenderness or abnormalities EYES: normal, PERRLA, Conjunctiva are pink and non-injected EARS: External ears normal, Canals clear OROPHARYNX:no exudate, no erythema and lips, buccal mucosa, and tongue normal  NECK: supple, no bruits, no JVD LYMPH:  no palpable lymphadenopathy, no hepatosplenomegaly LUNGS: Absent breath sounds and dullness to percussion on the right side of the chest. Clear to auscultation on the left HEART: regular rate & rhythm, no murmurs and no gallops ABDOMEN:abdomen soft, non-tender, normal bowel sounds and no masses or organomegaly BACK: Back symmetric, no curvature., No CVA tenderness EXTREMITIES:no joint deformities, effusion, or inflammation, no edema, no skin discoloration  NEURO: alert & oriented x 3 with fluent speech, no focal motor/sensory deficits  PERFORMANCE STATUS: ECOG 1  LABORATORY DATA: Lab Results  Component Value Date   WBC 7.7 02/14/2016   HGB 12.5 (L) 02/14/2016   HCT 38.7 02/14/2016   MCV 91.1 02/14/2016   PLT 360 02/14/2016      Chemistry      Component Value Date/Time   NA 141 02/14/2016 1239   K 4.4 02/14/2016 1239   CL 104  02/04/2016 0308   CO2 27 02/14/2016 1239   BUN 8.3 02/14/2016 1239   CREATININE 0.9 02/14/2016 1239      Component Value Date/Time   CALCIUM 8.9 02/14/2016 1239   ALKPHOS 95 02/14/2016 1239   AST 25 02/14/2016 1239   ALT 17 02/14/2016 1239   BILITOT 0.39 02/14/2016 1239       RADIOGRAPHIC STUDIES: Dg Chest 1 View  Result Date: 01/27/2016 CLINICAL DATA:  Status post RIGHT thoracentesis EXAM: CHEST 1 VIEW COMPARISON:  CT 01/25/2016, radiograph [id number] FINDINGS: Large volume of fluid occupies the near  entirety of the RIGHT hemi thorax. Small amount of aerated lung adjacent the RIGHT hilum is increased from radiograph 01/25/2016. No mediastinal shift. LEFT lung is clear. No pneumothorax. IMPRESSION: Minimal expansion of aerated RIGHT lung adjacent to the RIGHT hilum following thoracentesis. Persistent large volume fluid occupying the near entirety of the RIGHT hemi thorax. No pneumothorax appreciated. Electronically Signed   By: Suzy Bouchard M.D.   On: 01/27/2016 13:27   Dg Chest 1 View  Result Date: 01/25/2016 CLINICAL DATA:  Status post thoracentesis with 2 L removed. EXAM: CHEST 1 VIEW COMPARISON:  Chest x-ray from earlier same day. FINDINGS: Despite the thoracentesis, there remains complete opacification the right hemi thorax. There is decreased leftward shift of the heart compatible with the removal of 2 L of fluid from the right chest. Left lung remains clear. IMPRESSION: 1. Persistent complete opacification of the right hemithorax status post thoracentesis, presumably a large amount of residual effusion. 2. Decreased leftward shift of the heart compatible with the interval removal of 2 L of fluid from the right chest. Electronically Signed   By: Franki Cabot M.D.   On: 01/25/2016 13:01   Dg Chest 2 View  Result Date: 02/04/2016 CLINICAL DATA:  Pneumothorax EXAM: CHEST  2 VIEW COMPARISON:  02/03/2016 FINDINGS: Right hydropneumothorax. The small to moderate pneumothorax component is considered un ex vacuo pneumothorax due to lack of re-expansion of the abnormal right lung. There is an increasing moderate to large pleural fluid component with multiple air-fluid levels, suggesting loculation. There has been interval removal of the apical chest tube. Stable indwelling basilar pleurodesis catheter. Left lung is essentially clear. The heart is normal in size. IMPRESSION: Right hydropneumothorax with increasing pleural fluid component and multiple air-fluid levels, suggesting loculation. Underlying  abnormal right lung with lack of re-expansion secondary to tumor. Stable indwelling pleurodesis catheter. Electronically Signed   By: Julian Hy M.D.   On: 02/04/2016 08:33   Dg Chest 2 View  Result Date: 02/01/2016 CLINICAL DATA:  Evaluate pleural effusion EXAM: CHEST  2 VIEW COMPARISON:  PA lateral chest x-ray of January 30, 2016 FINDINGS: There remains a large left pleural effusion. Further decrease in the amount of aerated right lung is observed and likely less than 10% of the lung is now aerated. There is no mediastinal shift. The left lung is well-expanded. Interstitial markings on the left are mildly prominent. And no left pleural effusion is observed. The left heart border is normal. The pulmonary vascularity is not engorged. IMPRESSION: Further interval increase in the large right pleural effusion with only a small amount of aerated right lung remaining. Electronically Signed   By: David  Martinique M.D.   On: 02/01/2016 07:07   Dg Chest 2 View  Result Date: 01/30/2016 CLINICAL DATA:  Pleural effusion status post thoracentesis. EXAM: CHEST  2 VIEW COMPARISON:  01/29/2016, 01/27/2016 FINDINGS: Normal cardiac silhouette. Mild expansion of the RIGHT lower lobe following  thoracentesis. Minimal expansion of RIGHT upper lobe. Large RIGHT effusion remains. LEFT lung is clear knee No pneumothorax. IMPRESSION: Mild expansion of the RIGHT lower lobe following thoracentesis. No pneumothorax. Large RIGHT effusion remains. Electronically Signed   By: Suzy Bouchard M.D.   On: 01/30/2016 11:04   Dg Chest 2 View  Result Date: 01/29/2016 CLINICAL DATA:  Status post thoracentesis on January 27, 2016. Persistent shortness of breath. No chest pain. EXAM: CHEST  2 VIEW COMPARISON:  Portable chest x-ray of January 27, 2016 FINDINGS: There remains near total opacification of the right hemi thorax. A small amount of aerated lung is noted in the right paratracheal region. The left lung is well-expanded. There is no  focal infiltrate. There is no significant mediastinal shift. The left heart border is normal. The pulmonary vascularity is not engorged. IMPRESSION: Little change in the appearance of the nearly totally opacified right hemi thorax. A large pleural effusion likely remains. There is no pneumothorax. The left lung is clear. Electronically Signed   By: David  Martinique M.D.   On: 01/29/2016 07:55   Ct Angio Chest Pe W Or Wo Contrast  Result Date: 01/25/2016 CLINICAL DATA:  Shortness of Breath EXAM: CT ANGIOGRAPHY CHEST WITH CONTRAST TECHNIQUE: Multidetector CT imaging of the chest was performed using the standard protocol during bolus administration of intravenous contrast. Multiplanar CT image reconstructions and MIPs were obtained to evaluate the vascular anatomy. CONTRAST:  100 mL Isovue-300 nonionic COMPARISON:  Chest radiograph January 25, 2016 FINDINGS: Cardiovascular: No pulmonary embolus is evident. Note that there is extensive compressive atelectasis on the right limiting pulmonary arterial visualization on the right. The ascending thoracic aortic diameter is 4.0 x 3.9 cm. There is no thoracic aortic dissection. The visualized great vessels appear normal. There is no appreciable pericardial effusion. Note that the heart and mediastinum are shifted to the left due to large pleural effusion present. Mediastinum/Nodes: There is no evident adenopathy. Thyroid appears unremarkable except for a single benign-appearing calcification in the superior left lobe region. Lungs/Pleura: There is a large pleural effusion occupying most of the right hemi thorax. This effusion the shifting the mediastinum and heart toward the left. There is compressive atelectasis and consolidation throughout the right lung. There is only a minimal amount of aeration on the right superiorly. On the left, there are multiple cysts nodular opacities scattered throughout the lung. The largest of these nodular opacities is in the posterior segment  of the left lower lobe measuring 1.6 x 1.2 cm. Other nodular opacities range in size from as small as 4 mm to as large as 1.3 cm. There is no edema or consolidation on the left. Upper Abdomen: Subpulmonic component to the effusion displaces the liver toward the left. Visualized upper abdominal structures otherwise appear unremarkable. Musculoskeletal: There are no demonstrable blastic or lytic bone lesions. Review of the MIP images confirms the above findings. IMPRESSION: No evident pulmonary embolus. Ascending thoracic aorta measures 4.0 x 3.9 cm in diameter. Recommend annual imaging followup by CTA or MRA. This recommendation follows 2010 ACCF/AHA/AATS/ACR/ASA/SCA/SCAI/SIR/STS/SVM Guidelines for the Diagnosis and Management of Patients with Thoracic Aortic Disease. Circulation. 2010; 121: M353-I144. No thoracic aortic dissection evident. Large pleural effusion on the right causing shift of heart and mediastinum as well as liver toward the left. There is extensive compressive atelectasis in the right lung with potential underlying consolidation as well. Only a minimal amount of right lung is aerated. On the left, there are multiple nodular opacities concerning for metastatic foci. Largest of  these lesions is in the posterior segment left lower lobe measuring 1.6 x 1.2 cm. There is no left lung edema or consolidation. No adenopathy is evident. Electronically Signed   By: Lowella Grip III M.D.   On: 01/25/2016 13:51   Mr Jeri Cos LO Contrast  Result Date: 01/30/2016 CLINICAL DATA:  Malignant pleural effusion.  Suspected Lung cancer. EXAM: MRI HEAD WITHOUT AND WITH CONTRAST TECHNIQUE: Multiplanar, multiecho pulse sequences of the brain and surrounding structures were obtained without and with intravenous contrast. CONTRAST:  46m MULTIHANCE GADOBENATE DIMEGLUMINE 529 MG/ML IV SOLN COMPARISON:  None. FINDINGS: No evidence for acute infarction, hemorrhage, mass lesion, hydrocephalus, or extra-axial fluid.  Premature for age cerebral and cerebellar atrophy. Mild subcortical and periventricular T2 and FLAIR hyperintensities, likely chronic microvascular ischemic change. Pituitary, pineal, and cerebellar tonsils unremarkable. No upper cervical lesions. Flow voids are maintained throughout the carotid, basilar, and vertebral arteries. There are no areas of chronic hemorrhage. Post infusion, no abnormal enhancement of the brain or meninges. Visualized calvarium, skull base, and upper cervical osseous structures unremarkable. Scalp and extracranial soft tissues, orbits, sinuses, and mastoids show no acute process. IMPRESSION: Negative for intracranial metastatic disease. Mild atrophy and small vessel disease. Electronically Signed   By: JStaci RighterM.D.   On: 01/30/2016 21:42   Ct Abdomen Pelvis W Contrast  Result Date: 01/25/2016 CLINICAL DATA:  Shortness of breath with abdominal pain and tightness EXAM: CT ABDOMEN AND PELVIS WITH CONTRAST TECHNIQUE: Multidetector CT imaging of the abdomen and pelvis was performed using the standard protocol following bolus administration of intravenous contrast. CONTRAST:  100 mL Omnipaque 300 nonionic COMPARISON:  CT angiogram chest obtained earlier in the day FINDINGS: Lower chest: Nodular lesions are noted in the left lung base, largest measuring 1.6 x 1.3 cm, also seen on CT angiogram chest earlier in the day. On the right, there is consolidation and compressive atelectasis with large right effusion causing shift of heart and mediastinum toward the left. Hepatobiliary: The liver is displaced toward the left due to the subpulmonic component of the large right pleural effusion. No intrinsic liver lesions are evident. Gallbladder wall is not appreciably thickened. There is no biliary duct dilatation. Pancreas: No pancreatic mass or inflammatory focus is evident. Spleen: No splenic lesions are evident. Adrenals/Urinary Tract: Adrenals appear unremarkable bilaterally. There are small  cysts in the periphery of the left kidney, largest measuring 0.9 x 0.8 cm. There is no hydronephrosis on either side. There are no demonstrable renal or ureteral calculi on either side. The urinary bladder is midline with wall thickness within normal limits. Stomach/Bowel: There are scattered sigmoid diverticula without diverticulitis. There is no bowel wall or mesenteric thickening. There is no bowel obstruction. No free air or portal venous air evident. Vascular/Lymphatic: There are is patchy atherosclerotic calcification in the aorta and common iliac arteries without evidence of abdominal aortic aneurysm. Major mesenteric vessels appear patent. There is no appreciable adenopathy in the abdomen or pelvis. Reproductive: Prostate and seminal vesicles appear normal in size and contour. There is no pelvic mass or pelvic fluid collection. Other: Appendix appears normal. There is no ascites or abscess in the abdomen or pelvis. There are no peritoneal or retroperitoneal/omental type implants. Musculoskeletal: There is degenerative change in the lower lumbar spine. There are no blastic or lytic bone lesions. There is no intramuscular or abdominal wall lesion. IMPRESSION: Nodular lesions noted in the left lung base. These nodular lesions are concerning for metastatic foci. A large right pleural effusion is  noted with consolidation and compressive atelectasis throughout the right lung. A potential right-sided lung neoplasm could easily be obscured by these changes. The right effusion causes shift of heart and mediastinum as well as liver toward the left. No liver lesions are evident. No adenopathy. A neoplastic focus is not seen within the abdomen or pelvis. No bowel obstruction or inflammatory focus. No abscess. Appendix appears normal. No ascites. Electronically Signed   By: Lowella Grip III M.D.   On: 01/25/2016 14:01   Dg Chest Port 1 View  Result Date: 02/05/2016 CLINICAL DATA:  Right chest tube. EXAM: PORTABLE  CHEST 1 VIEW COMPARISON:  02/04/2016. FINDINGS: Right chest tube in stable position. Stable right sided pneumothorax again noted without interim change. Right pleural effusion most likely present. Pleural fluid is less well identified on today's exam, this may be secondary to semi-upright positioning the patient. Left lung is clear. Cardiomegaly. No acute bony abnormality. IMPRESSION: Scratched Right chest tube in stable position. Stable right pneumothorax. Right pleural effusion most likely present. Pleural fluid is less well identified on today's exam, this may be secondary to semi-upright positioning of the patient. Electronically Signed   By: Marcello Moores  Register   On: 02/05/2016 07:20   Dg Chest Port 1 View  Result Date: 02/03/2016 CLINICAL DATA:  Pneumothorax EXAM: PORTABLE CHEST 1 VIEW COMPARISON:  02/02/2016 FINDINGS: Stable right apical chest tube with moderate ex vacuo pneumothorax due to lack of re-expansion of the abnormal right upper lobe and right lower lobe secondary to underlying tumor. Associated moderate right pleural effusion, unchanged from the prior, improved from more remote studies. Left lung is clear. The heart is top-normal in size. Stable right IJ venous catheter. Right PleurX catheter is poorly visualized. IMPRESSION: Stable right apical chest tube with moderate ex vacuo pneumothorax due to lack of re-expansion of the abnormal right lung secondary to underlying tumor. Associated moderate right pleural effusion, unchanged from the recent prior. Electronically Signed   By: Julian Hy M.D.   On: 02/03/2016 07:24   Dg Chest Port 1 View  Result Date: 02/02/2016 CLINICAL DATA:  Adenocarcinoma of the right lung with malignant right pleural effusion and non re-expansion of the right lung. EXAM: PORTABLE CHEST 1 VIEW COMPARISON:  02/01/2016 FINDINGS: Large caliber right-sided chest tube and additional PleurX catheter remain. There is some improved expansion of the right lower lung. There  remains significant component of lack of re-expansion of the upper lobe is secondary to tumor and ex vacuo pneumothorax after pleural fluid evacuation. The left lung remains clear. Stable positioning of central line. IMPRESSION: Some improved expansion of the right lower lung noted compared to the prior chest x-ray. There remains lack of re-expansion of the right upper lobe secondary to malignancy and therefore the remaining pneumothorax remains an ex vacuo pneumothorax. Electronically Signed   By: Aletta Edouard M.D.   On: 02/02/2016 08:11   Dg Chest Port 1 View  Result Date: 02/01/2016 CLINICAL DATA:  Status post VATS EXAM: PORTABLE CHEST 1 VIEW COMPARISON:  None. FINDINGS: There is a large caliber right-sided chest tube in place along with a small caliber PleurX drainage type catheter. Despite this there is a large right pneumothorax. The right IJ catheter tip is in the distal SVC. The left lung is relatively clear. IMPRESSION: Right-sided chest tubes in good position but there is a 50% pneumothorax. Right IJ catheter tip is in the distal SVC. These results were called by telephone at the time of interpretation on 02/01/2016 at 10:22 am  to Dr. Ivin Poot , who verbally acknowledged these results. Electronically Signed   By: Marijo Sanes M.D.   On: 02/01/2016 10:23   Dg Chest Port 1 View  Result Date: 01/25/2016 CLINICAL DATA:  Shortness of breath. EXAM: PORTABLE CHEST 1 VIEW COMPARISON:  No prior . FINDINGS: Complete opacification of the right hemi thorax with shift of the mediastinum and heart to the left noted. Findings consist with large right pleural effusion. Underlying pulmonary disease cannot be excluded. Left lung is clear. No pneumothorax . IMPRESSION: Complete opacification of the right hemi thorax with shift of the mediastinum and heart to the left. Findings consist with large right pleural effusion. Underlying right lung disease cannot be excluded . Electronically Signed   By: Marcello Moores   Register   On: 01/25/2016 09:41   US Thoracentesis Asp Pleural Space W/img Guide  Result Date: 01/30/2016 INDICATION: Recurrent malignant right pleural effusion. Request repeat thoracentesis. EXAM: ULTRASOUND GUIDED RIGHT THORACENTESIS MEDICATIONS: None. COMPLICATIONS: None immediate. PROCEDURE: An ultrasound guided thoracentesis was thoroughly discussed with the patient and questions answered. The benefits, risks, alternatives and complications were also discussed. The patient understands and wishes to proceed with the procedure. Written consent was obtained. Ultrasound was performed to localize and mark an adequate pocket of fluid in the right chest. The area was then prepped and draped in the normal sterile fashion. 1% Lidocaine was used for local anesthesia. Under ultrasound guidance a Safe-T-Centesis catheter was introduced. Thoracentesis was performed. The patient complained of significant discomfort and chest tightness on the right side. The procedure was stopped at this point. The catheter was removed and a dressing applied. FINDINGS: A total of approximately 2 L of dark bloody fluid was removed. Samples were sent to the laboratory as requested by the clinical team. Residual effusion remains. IMPRESSION: Successful ultrasound guided right thoracentesis yielding 2 L of pleural fluid. Read by: Ascencion Dike PA-C Electronically Signed   By: Sandi Mariscal M.D.   On: 01/30/2016 10:45   US Thoracentesis Asp Pleural Space W/img Guide  Result Date: 01/27/2016 INDICATION: Shortness of breath. Large right pleural effusion. Request repeat right thoracentesis. EXAM: ULTRASOUND GUIDED RIGHT THORACENTESIS MEDICATIONS: None. COMPLICATIONS: None immediate. PROCEDURE: An ultrasound guided thoracentesis was thoroughly discussed with the patient and questions answered. The benefits, risks, alternatives and complications were also discussed. The patient understands and wishes to proceed with the procedure. Written consent  was obtained. Ultrasound was performed to localize and mark an adequate pocket of fluid in the right chest. The area was then prepped and draped in the normal sterile fashion. 1% Lidocaine was used for local anesthesia. Under ultrasound guidance a Safe-T-Centesis catheter was introduced. Thoracentesis was performed. The catheter was removed and a dressing applied. FINDINGS: A total of approximately 3 L of bloody pleural fluid was removed. IMPRESSION: Successful ultrasound guided right thoracentesis yielding 3 L of pleural fluid. Read by: Ascencion Dike PA-C Electronically Signed   By: Lucrezia Europe M.D.   On: 01/27/2016 13:11   US Thoracentesis Asp Pleural Space W/img Guide  Result Date: 01/25/2016 INDICATION: New onset shortness of breath with massive right pleural effusion. Request is made for diagnostic and therapeutic thoracentesis EXAM: ULTRASOUND GUIDED DIAGNOSTIC AND THERAPEUTIC THORACENTESIS MEDICATIONS: 1% lidocaine. COMPLICATIONS: None immediate. PROCEDURE: An ultrasound guided thoracentesis was thoroughly discussed with the patient and questions answered. The benefits, risks, alternatives and complications were also discussed. The patient understands and wishes to proceed with the procedure. Written consent was obtained. Ultrasound was performed to localize and mark  an adequate pocket of fluid in the right chest. The area was then prepped and draped in the normal sterile fashion. 1% Lidocaine was used for local anesthesia. Under ultrasound guidance a Safe-T-Centesis catheter was introduced. Thoracentesis was performed. The catheter was removed and a dressing applied. FINDINGS: A total of approximately 2 L of serosanguineous fluid was removed. Samples were sent to the laboratory as requested by the clinical team. IMPRESSION: Successful ultrasound guided right thoracentesis yielding 2 L of pleural fluid. A significant effusion remained after the procedure. The procedure was terminated after 2 L which is  standard for first time thoracentesis. Read by: Saverio Danker, PA-C Electronically Signed   By: Lucrezia Europe M.D.   On: 01/25/2016 13:06    ASSESSMENT: This is a very pleasant 55 years old white male recently diagnosed with a stage IV (TX, MX, M1 a) non-small cell lung cancer, adenocarcinoma presented with large right pleural effusion with collapse of the right lung and shift of mediastinum and heart to the left in addition to left lung pulmonary nodules diagnosed in August 2017.   PLAN: I had a lengthy discussion with the patient today about his current disease stage, prognosis and treatment options. The patient understands that he has incurable condition and also treatment will be off palliative nature. I will complete the staging workup by ordering a PET scan for further evaluation of his disease and to identify the primary lesion of his lung cancer. I also requested the tissue block to be sent to [facility] one for molecular studies and the results are still pending. I discussed with the patient several options for treatment of his condition including palliative care and hospice referral versus consideration of palliative systemic chemotherapy/immunotherapy versus treatment with targeted therapy if he has any actionable mutation on the molecular studies. The patient feels a little bit better and he is currently undergoing drainage of the pleural fluid via the Pleurx catheter at home. He would like to wait until the molecular studies are available before proceeding with systemic therapy. I will arrange for him to come back for follow-up visit in less than 2 weeks for more detailed discussion of his treatment options based on the molecular studies. He was seen during the multidisciplinary thoracic oncology clinic today by medical oncology, thoracic navigator, social worker and physical therapist. The patient was advised to call immediately if he has any concerning symptoms in the interval.  The  patient voices understanding of current disease status and treatment options and is in agreement with the current care plan.  All questions were answered. The patient knows to call the clinic with any problems, questions or concerns. We can certainly see the patient much sooner if necessary.  Thank you so much for allowing me to participate in the care of Roger Barnes. I will continue to follow up the patient with you and assist in his care.  I spent 55 minutes counseling the patient face to face. The total time spent in the appointment was 80 minutes.  Disclaimer: This note was dictated with voice recognition software. Similar sounding words can inadvertently be transcribed and may not be corrected upon review.   Natale Barba K. February 14, 2016, 2:11 PM

## 2016-02-14 NOTE — Progress Notes (Signed)
[facility] Clinical Social Work  Clinical Social Work met with patient/family at [employer] appointment to offer support and assess for psychosocial needs.  Roger Barnes was alone for today's visit.  He shared his father was waiting in the lobby.  Roger Barnes shared he has no anxiety surrounding his diagnosis, shared he believes God chooses when it is "time to go" and he has no control in that situation.  CSW and patient briefly discussed applying for social security disability, the patient was unsure if he wants to apply at this time.  ONCBCN DISTRESS SCREENING 02/14/2016  Screening Type Initial Screening  Distress experienced in past week (1-10) 1  Physical Problem type Pain;Getting around     Clinical Social Work briefly discussed Clinical Social Work role and [employer] support programs/services.  Clinical Social Work encouraged patient to call with any additional questions or concerns.   Polo Riley, MSW, LCSW, OSW-C Clinical Social Worker [facility] [phone]

## 2016-02-14 NOTE — Therapy (Addendum)
[place], [place], [address] Phone: [phone]   Fax:  [phone]  Physical Therapy Evaluation  Patient Details  Name: Roger Barnes MRN: [id number] Date of Birth: 1960-10-28 Referring Provider: Dr. Curt Bears  Encounter Date: 02/14/2016      PT End of Session - 02/14/16 1437    Visit Number 1   Number of Visits 1   PT Start Time 6962   PT Stop Time 1422   PT Time Calculation (min) 29 min   Activity Tolerance Patient tolerated treatment well   Behavior During Therapy [facility] for tasks assessed/performed      Past Medical History:  Diagnosis Date  . Adenocarcinoma of right lung, stage 4 ([place])   . Pleural effusion, right 01/25/2016    Past Surgical History:  Procedure Laterality Date  . CHEST TUBE INSERTION Right 02/01/2016   Procedure: INSERTION PLEURAL DRAINAGE CATHETER;  Surgeon: Ivin Poot, MD;  Location: [place];  Service: Thoracic;  Laterality: Right;  . PLEURADESIS Right 02/01/2016   Procedure: DOXYCYCLINE PLEURADESIS;  Surgeon: Ivin Poot, MD;  Location: [place];  Service: Thoracic;  Laterality: Right;  . THORACENTESIS Right 01/25/2016  . VIDEO ASSISTED THORACOSCOPY Right 02/01/2016   Procedure: VIDEO ASSISTED THORACOSCOPY;  Surgeon: Ivin Poot, MD;  Location: [place];  Service: Thoracic;  Laterality: Right;  Marland Kitchen VIDEO BRONCHOSCOPY N/A 02/01/2016   Procedure: VIDEO BRONCHOSCOPY;  Surgeon: Ivin Poot, MD;  Location: [facility] OR;  Service: Thoracic;  Laterality: N/A;  . WISDOM TOOTH EXTRACTION      There were no vitals filed for this visit.       Subjective Assessment - 02/14/16 1427    Subjective Stays pretty active taking care of my parents and running errands.   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 Current plan is to have a PET scan and chemotherapy. Patient had VATS on 02/01/16.    Patient Stated Goals to obtain information from lung multidisciplinary clinic providers   Currently in Pain? Yes   Pain Score 2    Pain Location Chest   Pain Orientation Right   Pain Descriptors / Indicators Dull   Pain Type Surgical pain            [facility] PT Assessment - 02/14/16 0001      Assessment   Medical Diagnosis right pleura adenocarcinoma   Referring Provider Dr. Curt Bears   Prior Therapy no     Precautions   Precaution Comments cancer precautions     Restrictions   Weight Bearing Restrictions No     Balance Screen   Has the patient fallen in the past 6 months No   Has the patient had a decrease in activity level because of a fear of falling?  No   Is the patient reluctant to leave their home because of a fear of falling?  No     Home Ecologist residence   Living Arrangements Parent   Type of [place] One level     Prior Function   Level of Independence Independent   Leisure no regular exercise; per patient report, he helps take care of his parents     Cognition   Overall Cognitive Status Within Functional Limits for tasks assessed     Functional Tests   Functional tests Sit to Stand     Sit to  Stand   Comments performed 8 repetitions during 30 second sit to stand, below average for age     Posture/Postural Control   Posture Comments significant forward head and rounded shoulders, increased kyphosis     ROM / Strength   AROM / PROM / Strength AROM     AROM   Overall AROM Comments trunk AROM WFL for all directions     Ambulation/Gait   Ambulation/Gait Yes   Ambulation/Gait Assistance 7: Independent     Balance   Balance Assessed Yes     Dynamic Standing Balance   Dynamic Standing - Comments able to reach 13.5 inches during standing Functional Reach test, below average for age                           PT Education - 02/14/16 1435    Education provided Yes    Education Details energy conservation, walking program, Cure article, posture, deep breathing, physical therapy information   Person(s) Educated Patient   Methods Explanation;Handout;Demonstration   Comprehension Verbalized understanding               Lung Clinic Goals - 02/14/16 1508      Patient will be able to verbalize understanding of the benefit of exercise to decrease fatigue.   Status Achieved     Patient will be able to verbalize the importance of posture.   Status Achieved     Patient will be able to demonstrate diaphragmatic breathing for improved lung function.   Status Achieved     Patient will be able to verbalize understanding of the role of physical therapy to prevent functional decline and who to contact if physical therapy is needed.   Status Achieved             Plan - 02/14/16 1437    Clinical Impression Statement Patient is a 55 year old male diagnosed with stage IV, PDL1 negative right pleura adenocarcinoma with left lower lobe nodules. Current plan is to do a PET scan and chemotherapy. 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 He demonstrates decreased standing balance with standing Functional Reach and performed fewer than average repetitions during 30 second sit to stand. He was instructed to inform his doctor should he need therapy in the future.   PT Treatment/Interventions ADLs/Self Care Home Management;Patient/family education   PT Next Visit Plan Currently no therapy needs   Consulted and Agree with Plan of Care Patient      Patient will benefit from skilled therapeutic intervention in order to improve the following deficits and impairments:  Decreased balance, Decreased endurance, Postural dysfunction  Visit Diagnosis: Unsteadiness on feet  G-code information: Assessment tool used: clinical judgment. Category:  Changing, maintaining body  position. Current status:  CI Goal status:  CI Discharge status:  CI   Problem List Patient Active Problem List   Diagnosis Date Noted  . Adenocarcinoma of right lung, stage 4 ([place])   . Acute respiratory failure with hypoxia ([place])   . Atelectasis   . Pleural effusion 01/25/2016  . Elevated d-dimer 01/25/2016  . Abnormal EKG 01/25/2016  . Pleural effusion, right   . Dyspnea     Mellody Life, SPT 02/14/2016, 3:27 PM  [place], [place], [address] Phone: [phone]   Fax:  [phone]  Name: Roger Barnes  MRN: [id number] Date of Birth: 24-Dec-1960  This entire session was guided, instructed, and directly supervised by Serafina Royals, PT.  Read, reviewed, edited and agree with student's findings and recommendations.   Serafina Royals, PT 02/14/16 3:37 PM

## 2016-02-15 ENCOUNTER — Encounter: Payer: Self-pay | Admitting: Internal Medicine

## 2016-02-15 ENCOUNTER — Telehealth: Payer: Self-pay | Admitting: *Deleted

## 2016-02-15 ENCOUNTER — Encounter: Payer: Self-pay | Admitting: *Deleted

## 2016-02-15 NOTE — Telephone Encounter (Signed)
"  I need to check if I haver a Medicaid card or something in Process.  [place] unclear and [place] may be pending or I need an case manager assigned.  I was advised to call my doctor to have this investigated."  Call transferred ext [phone].

## 2016-02-15 NOTE — Progress Notes (Signed)
Patient called and left a message about his Medicaid card and ID number. Returned patient's call after reviewing notes from recent hospital admission. .Patient states he called DSS and they didn't have anything on file for him and he needed an id number because they weren't able to locate information by his dob and ss#.    Per passport   [facility] [phone]  Sherrie George [id number] [place] Reference Number:  [id number] Patient eligible for Medicaid. Provided patient with id number that came up with his name and dob. The address listed is not his address. Emailed Porchia F.(Financial Counselor) on hospital side whom saw him on his recent admission to find out if he has a caseworker assigned and confirm this is the correct Medicaid.

## 2016-02-15 NOTE — Telephone Encounter (Signed)
I received a call back from central scheduling.  I was given an appt time.  I called and spoke with patient.  He is set up for PET on 02/22/16.  I gave him pre procedure instructions.

## 2016-02-15 NOTE — Progress Notes (Signed)
Oncology Nurse Navigator Documentation  Oncology Nurse Navigator Flowsheets 02/15/2016  Navigator Encounter Type Other/I followed up to see if scan was authorized.  It has been.  I called central scheduling to set up an appt for patient for his PET scan.  I was unable to reach scheduler but was able to leave a vm message for them to call me.   Treatment Phase Pre-Tx/Tx Discussion  Barriers/Navigation Needs Coordination of Care  Interventions Coordination of Care  Coordination of Care Radiology  Acuity Level 2  Acuity Level 2 Assistance expediting appointments  Time Spent with Patient 30

## 2016-02-19 ENCOUNTER — Encounter (HOSPITAL_COMMUNITY): Payer: Self-pay

## 2016-02-21 ENCOUNTER — Encounter: Payer: Medicaid Other | Admitting: Cardiothoracic Surgery

## 2016-02-22 ENCOUNTER — Encounter (HOSPITAL_COMMUNITY)
Admission: RE | Admit: 2016-02-22 | Discharge: 2016-02-22 | Disposition: A | Discharge status: Home / Self Care | Payer: Medicare HMO | Source: Ambulatory Visit | Attending: Internal Medicine | Admitting: Internal Medicine

## 2016-02-22 DIAGNOSIS — C3491 Malignant neoplasm of unspecified part of right bronchus or lung: Secondary | ICD-10-CM | POA: Insufficient documentation

## 2016-02-22 DIAGNOSIS — J90 Pleural effusion, not elsewhere classified: Secondary | ICD-10-CM

## 2016-02-22 LAB — GLUCOSE, CAPILLARY: GLUCOSE-CAPILLARY: 122 mg/dL — AB (ref 65–99)

## 2016-02-22 MED ORDER — FLUDEOXYGLUCOSE F - 18 (FDG) INJECTION
8.1200 | Freq: Once | INTRAVENOUS | Status: AC | PRN
  Administered 2016-02-22: 8.12 via INTRAVENOUS

## 2016-02-25 ENCOUNTER — Other Ambulatory Visit: Payer: Self-pay | Admitting: Cardiothoracic Surgery

## 2016-02-25 DIAGNOSIS — J90 Pleural effusion, not elsewhere classified: Secondary | ICD-10-CM

## 2016-02-26 ENCOUNTER — Other Ambulatory Visit: Payer: Self-pay | Admitting: *Deleted

## 2016-02-26 ENCOUNTER — Encounter: Payer: Self-pay | Admitting: Cardiothoracic Surgery

## 2016-02-26 ENCOUNTER — Other Ambulatory Visit: Payer: Self-pay | Admitting: Internal Medicine

## 2016-02-26 ENCOUNTER — Other Ambulatory Visit (HOSPITAL_BASED_OUTPATIENT_CLINIC_OR_DEPARTMENT_OTHER): Payer: Medicare HMO

## 2016-02-26 ENCOUNTER — Encounter: Payer: Self-pay | Admitting: Internal Medicine

## 2016-02-26 ENCOUNTER — Ambulatory Visit
Admission: RE | Admit: 2016-02-26 | Discharge: 2016-02-26 | Disposition: A | Discharge status: Home / Self Care | Payer: Medicare HMO | Source: Ambulatory Visit | Attending: Cardiothoracic Surgery | Admitting: Cardiothoracic Surgery

## 2016-02-26 ENCOUNTER — Ambulatory Visit (HOSPITAL_BASED_OUTPATIENT_CLINIC_OR_DEPARTMENT_OTHER): Payer: Medicare HMO | Admitting: Internal Medicine

## 2016-02-26 ENCOUNTER — Ambulatory Visit (INDEPENDENT_AMBULATORY_CARE_PROVIDER_SITE_OTHER): Payer: Self-pay | Admitting: Cardiothoracic Surgery

## 2016-02-26 VITALS — BP 114/80 | HR 94 | Temp 98.1°F | Resp 17 | Ht 70.0 in | Wt 162.4 lb

## 2016-02-26 VITALS — BP 125/80 | HR 110 | Resp 20 | Ht 70.0 in | Wt 160.0 lb

## 2016-02-26 DIAGNOSIS — C3491 Malignant neoplasm of unspecified part of right bronchus or lung: Secondary | ICD-10-CM

## 2016-02-26 DIAGNOSIS — Z23 Encounter for immunization: Secondary | ICD-10-CM

## 2016-02-26 DIAGNOSIS — J948 Other specified pleural conditions: Secondary | ICD-10-CM

## 2016-02-26 DIAGNOSIS — J90 Pleural effusion, not elsewhere classified: Secondary | ICD-10-CM

## 2016-02-26 DIAGNOSIS — Z5111 Encounter for antineoplastic chemotherapy: Secondary | ICD-10-CM

## 2016-02-26 DIAGNOSIS — C3432 Malignant neoplasm of lower lobe, left bronchus or lung: Secondary | ICD-10-CM

## 2016-02-26 DIAGNOSIS — C3490 Malignant neoplasm of unspecified part of unspecified bronchus or lung: Secondary | ICD-10-CM

## 2016-02-26 HISTORY — DX: Encounter for antineoplastic chemotherapy: Z51.11

## 2016-02-26 LAB — COMPREHENSIVE METABOLIC PANEL
ALBUMIN: 2.7 g/dL — AB (ref 3.5–5.0)
ALK PHOS: 100 U/L (ref 40–150)
ALT: 20 U/L (ref 0–55)
AST: 23 U/L (ref 5–34)
Anion Gap: 10 mEq/L (ref 3–11)
BUN: 7.6 mg/dL (ref 7.0–26.0)
CHLORIDE: 105 meq/L (ref 98–109)
CO2: 26 mEq/L (ref 22–29)
Calcium: 8.9 mg/dL (ref 8.4–10.4)
Creatinine: 0.9 mg/dL (ref 0.7–1.3)
EGFR: >90 mL/min/{1.73_m2} (ref >90)
GLUCOSE: 109 mg/dL (ref 70–140)
POTASSIUM: 3.9 meq/L (ref 3.5–5.1)
SODIUM: 141 meq/L (ref 136–145)
Total Bilirubin: 0.52 mg/dL (ref 0.20–1.20)
Total Protein: 6.8 g/dL (ref 6.4–8.3)

## 2016-02-26 LAB — CBC WITH DIFFERENTIAL/PLATELET
BASO%: 0.4 % (ref 0.0–2.0)
BASOS ABS: 0 10*3/uL (ref 0.0–0.1)
EOS%: 0.7 % (ref 0.0–7.0)
Eosinophils Absolute: 0.1 10*3/uL (ref 0.0–0.5)
HCT: 38.1 % — ABNORMAL LOW (ref 38.4–49.9)
HEMOGLOBIN: 12.3 g/dL — AB (ref 13.0–17.1)
LYMPH%: 9.4 % — ABNORMAL LOW (ref 14.0–49.0)
MCH: 28.6 pg (ref 27.2–33.4)
MCHC: 32.2 g/dL (ref 32.0–36.0)
MCV: 88.9 fL (ref 79.3–98.0)
MONO#: 0.7 10*3/uL (ref 0.1–0.9)
MONO%: 9.5 % (ref 0.0–14.0)
NEUT#: 5.7 10*3/uL (ref 1.5–6.5)
NEUT%: 80 % — ABNORMAL HIGH (ref 39.0–75.0)
Platelets: 275 10*3/uL (ref 140–400)
RBC: 4.28 10*6/uL (ref 4.20–5.82)
RDW: 15.1 % — AB (ref 11.0–14.6)
WBC: 7.1 10*3/uL (ref 4.0–10.3)
lymph#: 0.7 10*3/uL — ABNORMAL LOW (ref 0.9–3.3)

## 2016-02-26 MED ORDER — CYANOCOBALAMIN 1000 MCG/ML IJ SOLN
INTRAMUSCULAR | Status: AC
  Filled 2016-02-26: qty 1

## 2016-02-26 MED ORDER — LIDOCAINE-PRILOCAINE 2.5-2.5 % EX CREA: 1.0000 "application " | TOPICAL_CREAM | CUTANEOUS | 0 refills | Status: AC | PRN

## 2016-02-26 MED ORDER — INFLUENZA VAC SPLIT QUAD 0.5 ML IM SUSY
0.5000 mL | PREFILLED_SYRINGE | Freq: Once | INTRAMUSCULAR | Status: AC
Start: 2016-02-26 — End: 2016-02-26
  Administered 2016-02-26: 0.5 mL via INTRAMUSCULAR
  Filled 2016-02-26: qty 0.5

## 2016-02-26 MED ORDER — CYANOCOBALAMIN 1000 MCG/ML IJ SOLN
1000.0000 ug | Freq: Once | INTRAMUSCULAR | Status: AC
  Administered 2016-02-26: 1000 ug via INTRAMUSCULAR

## 2016-02-26 MED ORDER — PROCHLORPERAZINE MALEATE 10 MG PO TABS: 10.0000 mg | ORAL_TABLET | Freq: Four times a day (QID) | ORAL | 0 refills | Status: AC | PRN

## 2016-02-26 MED ORDER — DEXAMETHASONE 4 MG PO TABS: ORAL_TABLET | ORAL | 1 refills | Status: AC

## 2016-02-26 MED ORDER — FOLIC ACID 1 MG PO TABS: 1.0000 mg | ORAL_TABLET | Freq: Every day | ORAL | 4 refills | Status: DC

## 2016-02-26 NOTE — Progress Notes (Signed)
PCP is No PCP Per Patient Referring Provider is Elmarie Shiley, MD  Chief Complaint  Patient presents with  . Routine Post Op    f/u from surgery with CXR s/p RT VATS,drainage of pleural effusion, placement of right pleurX cath 02/01/16    HPI: Patient returns for her first postop visit after right VATS for drainage of malignant effusion and pleurodesis and Pleurx catheter placement. The patient is preparing for treatment of stage IV adenocarcinoma by Dr. Julien Nordmann. A Port-A-Cath will be placed for chemotherapy on October 5.  The patient's right Pleurx catheter is working well. It is being drained every other day. Usually 350-4 100 cc of serosanguineous fluid. Patient has had no problems with shortness of breath.  Chest x-ray shows the catheter in good position. There is a space in the right hemithorax from an entrapped right lower lobe from tumor. The x-ray taken before today's drainage showed a hydropneumothorax.  The sutures placed for the Pleurx insertion were removed and the incisions are clean and dry  Past Medical History:  Diagnosis Date  . Adenocarcinoma of right lung, stage 4 ([place])   . Encounter for antineoplastic chemotherapy 02/26/2016  . Pleural effusion, right 01/25/2016    Past Surgical History:  Procedure Laterality Date  . CHEST TUBE INSERTION Right 02/01/2016   Procedure: INSERTION PLEURAL DRAINAGE CATHETER;  Surgeon: Ivin Poot, MD;  Location: [place];  Service: Thoracic;  Laterality: Right;  . PLEURADESIS Right 02/01/2016   Procedure: DOXYCYCLINE PLEURADESIS;  Surgeon: Ivin Poot, MD;  Location: [place];  Service: Thoracic;  Laterality: Right;  . THORACENTESIS Right 01/25/2016  . VIDEO ASSISTED THORACOSCOPY Right 02/01/2016   Procedure: VIDEO ASSISTED THORACOSCOPY;  Surgeon: Ivin Poot, MD;  Location: [place];  Service: Thoracic;  Laterality: Right;  Marland Kitchen VIDEO BRONCHOSCOPY N/A 02/01/2016   Procedure: VIDEO BRONCHOSCOPY;  Surgeon: Ivin Poot, MD;  Location: [facility]  OR;  Service: Thoracic;  Laterality: N/A;  . WISDOM TOOTH EXTRACTION      Family History  Problem Relation Age of Onset  . Cancer Father     oral  . COPD Maternal Grandmother     Social History Social History  Substance Use Topics  . Smoking status: Former Research scientist (life sciences)  . Smokeless tobacco: Never Used     Comment: 01/25/2016 "haven't smoked since <2007"  . Alcohol use No    Current Outpatient Prescriptions  Medication Sig Dispense Refill  . acetaminophen (TYLENOL) 325 MG tablet Take 2 tablets (650 mg total) by mouth every 6 (six) hours as needed for mild pain (or Fever >/= 101). 10 tablet 0  . bisacodyl (DULCOLAX) 5 MG EC tablet Take 2 tablets (10 mg total) by mouth daily. 30 tablet 0  . dexamethasone (DECADRON) 4 MG tablet 4 mg by mouth twice a day the day before, day of and day after the chemotherapy every 3 weeks 40 tablet 1  . feeding supplement, ENSURE ENLIVE, (ENSURE ENLIVE) LIQD Take 237 mLs by mouth 2 (two) times daily between meals. 846 mL 12  . folic acid (FOLVITE) 1 MG tablet Take 1 tablet (1 mg total) by mouth daily. 30 tablet 4  . HYDROcodone-acetaminophen (NORCO/VICODIN) 5-325 MG tablet Take 1-2 tablets by mouth every 4 (four) hours as needed for moderate pain. 30 tablet 0  . levalbuterol (XOPENEX) 0.63 MG/3ML nebulizer solution Take 3 mLs (0.63 mg total) by nebulization 2 (two) times daily. 3 mL 12  . lidocaine-prilocaine (EMLA) cream Apply 1 application topically as needed. 30 g  0  . metoprolol tartrate (LOPRESSOR) 25 MG tablet Take 1 tablet (25 mg total) by mouth 2 (two) times daily. 60 tablet 0  . Multiple Vitamins-Minerals (MULTIVITAMIN WITH MINERALS) tablet Take 1 tablet by mouth daily.    . polyethylene glycol (MIRALAX / GLYCOLAX) packet Take 17 g by mouth daily as needed for mild constipation. 14 each 0  . prochlorperazine (COMPAZINE) 10 MG tablet Take 1 tablet (10 mg total) by mouth every 6 (six) hours as needed for nausea or vomiting. 30 tablet 0  . traMADol  (ULTRAM) 50 MG tablet Take 1 tablet (50 mg total) by mouth every 6 (six) hours as needed (mild pain). 30 tablet 0   No current facility-administered medications for this visit.     Allergies  Allergen Reactions  . No Known Allergies     Review of Systems  No shortness of breath No fever No cough or hemoptysis Slight weight loss since surgery  BP 125/80 (BP Location: Right Arm, Patient Position: Sitting, Cuff Size: Normal)   Pulse (!) 110   Resp 20   Ht '5\' 10"'$  (1.778 m)   Wt 160 lb (72.6 kg)   SpO2 98% Comment: RA  BMI 22.96 kg/m  Physical Exam       Exam    General- alert and comfortable   Lungs- clear without rales, wheezes   Cor- regular rate and rhythm, no murmur , gallop   Abdomen- soft, non-tender   Extremities - warm, non-tender, minimal edema   Neuro- oriented, appropriate, no focal weakness    Diagnostic Tests: Chest x-ray reviewed showing entrapped right lower lobe with resulting right hydropneumothorax Impression: Pleurx catheter functioning well Continue every other day drainage schedule Port-A-Cath insertion procedure discussed with patient including benefits and risks. Plan: Continue drainage schedule for Pleurx catheter therapy of right malignant effusion Outpatient Port-A-Cath placement at [place] on October 5   Len Childs, MD [facility] [phone]

## 2016-02-26 NOTE — Progress Notes (Signed)
[place] [phone]   [phone]  OFFICE PROGRESS NOTE  No PCP Per Patient No address on file  DIAGNOSIS: stage IV (T3, N0, M1 a) non-small cell lung cancer, adenocarcinoma presented with large right lower lobe lung mass, right large pleural effusion with collapse of the right lung and shift of mediastinum and heart to the left in addition to left lung pulmonary nodules diagnosed in August 2017.  FOUND[facility]: Molecular study:  Genomic Alteration Identified? KRAS G12C Additional Findings? Microsatellite status Cannot Be Determined Tumor Mutation Burden Cannot Be Determined Additional Disease-relevant Genes with No Reportable Alterations Identified? EGFR ALK BRAF MET RET ERBB2 ROS1  PRIOR THERAPY: None.  CURRENT THERAPY: Systemic chemotherapy with carboplatin for AUC of 5, Alimta 500 MG/M2 and Avastin 15 MG/KG every 3 weeks. First dose 03/05/2016.  INTERVAL HISTORY: Roger Barnes 55 y.o. male returns to the clinic today for follow-up visit. The patient is feeling much better today except for the baseline shortness of breath increased with exertion. He still has drainage from the left Pleurx catheter around 350 ML 3 times a week. He is scheduled to see Dr. Prescott Gum later today for reevaluation of his Pleurx catheter. He denied having any significant nausea, vomiting, diarrhea or constipation. He denied having any cough or hemoptysis. He had a PET scan performed recently and he is here today for evaluation and discussion of his scan results and treatment options. His molecular studies were performed and showed no actionable mutation.  MEDICAL HISTORY: Past Medical History:  Diagnosis Date  . Adenocarcinoma of right lung, stage 4 ([place])   . Pleural effusion, right 01/25/2016    ALLERGIES:  is allergic to no known allergies.  MEDICATIONS:  Current Outpatient Prescriptions  Medication Sig Dispense Refill  . acetaminophen  (TYLENOL) 325 MG tablet Take 2 tablets (650 mg total) by mouth every 6 (six) hours as needed for mild pain (or Fever >/= 101). 10 tablet 0  . bisacodyl (DULCOLAX) 5 MG EC tablet Take 2 tablets (10 mg total) by mouth daily. 30 tablet 0  . feeding supplement, ENSURE ENLIVE, (ENSURE ENLIVE) LIQD Take 237 mLs by mouth 2 (two) times daily between meals. 237 mL 12  . HYDROcodone-acetaminophen (NORCO/VICODIN) 5-325 MG tablet Take 1-2 tablets by mouth every 4 (four) hours as needed for moderate pain. 30 tablet 0  . levalbuterol (XOPENEX) 0.63 MG/3ML nebulizer solution Take 3 mLs (0.63 mg total) by nebulization 2 (two) times daily. 3 mL 12  . metoprolol tartrate (LOPRESSOR) 25 MG tablet Take 1 tablet (25 mg total) by mouth 2 (two) times daily. 60 tablet 0  . Multiple Vitamins-Minerals (MULTIVITAMIN WITH MINERALS) tablet Take 1 tablet by mouth daily.    . polyethylene glycol (MIRALAX / GLYCOLAX) packet Take 17 g by mouth daily as needed for mild constipation. 14 each 0  . traMADol (ULTRAM) 50 MG tablet Take 1 tablet (50 mg total) by mouth every 6 (six) hours as needed (mild pain). 30 tablet 0   No current facility-administered medications for this visit.     SURGICAL HISTORY:  Past Surgical History:  Procedure Laterality Date  . CHEST TUBE INSERTION Right 02/01/2016   Procedure: INSERTION PLEURAL DRAINAGE CATHETER;  Surgeon: Ivin Poot, MD;  Location: [facility];  Service: Thoracic;  Laterality: Right;  . PLEURADESIS Right 02/01/2016   Procedure: DOXYCYCLINE PLEURADESIS;  Surgeon: Ivin Poot, MD;  Location: [facility];  Service: Thoracic;  Laterality: Right;  . THORACENTESIS Right  01/25/2016  . VIDEO ASSISTED THORACOSCOPY Right 02/01/2016   Procedure: VIDEO ASSISTED THORACOSCOPY;  Surgeon: Ivin Poot, MD;  Location: [facility];  Service: Thoracic;  Laterality: Right;  Marland Kitchen VIDEO BRONCHOSCOPY N/A 02/01/2016   Procedure: VIDEO BRONCHOSCOPY;  Surgeon: Ivin Poot, MD;  Location: [facility] OR;  Service: Thoracic;   Laterality: N/A;  . WISDOM TOOTH EXTRACTION      REVIEW OF SYSTEMS:  Constitutional: positive for fatigue Eyes: negative Ears, nose, mouth, throat, and face: negative Respiratory: positive for cough and dyspnea on exertion Cardiovascular: negative Gastrointestinal: negative Genitourinary:negative Integument/breast: negative Hematologic/lymphatic: negative Musculoskeletal:negative Neurological: negative Behavioral/Psych: negative Endocrine: negative Allergic/Immunologic: negative   PHYSICAL EXAMINATION: General appearance: alert, cooperative, fatigued and no distress Head: Normocephalic, without obvious abnormality, atraumatic Neck: no adenopathy, no JVD, supple, symmetrical, trachea midline and thyroid not enlarged, symmetric, no tenderness/mass/nodules Lymph nodes: Cervical, supraclavicular, and axillary nodes normal. Resp: diminished breath sounds RLL and dullness to percussion RLL Back: symmetric, no curvature. ROM normal. No CVA tenderness. Cardio: regular rate and rhythm, S1, S2 normal, no murmur, click, rub or gallop GI: soft, non-tender; bowel sounds normal; no masses,  no organomegaly Extremities: extremities normal, atraumatic, no cyanosis or edema Neurologic: Alert and oriented X 3, normal strength and tone. Normal symmetric reflexes. Normal coordination and gait  ECOG PERFORMANCE STATUS: 1 - Symptomatic but completely ambulatory  Blood pressure 114/80, pulse 94, temperature 98.1 F (36.7 C), temperature source Oral, resp. rate 17, height 5' 10"  (1.778 m), weight 162 lb 6.4 oz (73.7 kg), SpO2 98 %.  LABORATORY DATA: Lab Results  Component Value Date   WBC 7.1 02/26/2016   HGB 12.3 (L) 02/26/2016   HCT 38.1 (L) 02/26/2016   MCV 88.9 02/26/2016   PLT 275 02/26/2016      Chemistry      Component Value Date/Time   NA 141 02/14/2016 1239   K 4.4 02/14/2016 1239   CL 104 02/04/2016 0308   CO2 27 02/14/2016 1239   BUN 8.3 02/14/2016 1239   CREATININE 0.9  02/14/2016 1239      Component Value Date/Time   CALCIUM 8.9 02/14/2016 1239   ALKPHOS 95 02/14/2016 1239   AST 25 02/14/2016 1239   ALT 17 02/14/2016 1239   BILITOT 0.39 02/14/2016 1239       RADIOGRAPHIC STUDIES: Dg Chest 1 View  Result Date: 01/27/2016 CLINICAL DATA:  Status post RIGHT thoracentesis EXAM: CHEST 1 VIEW COMPARISON:  CT 01/25/2016, radiograph [id number] FINDINGS: Large volume of fluid occupies the near entirety of the RIGHT hemi thorax. Small amount of aerated lung adjacent the RIGHT hilum is increased from radiograph 01/25/2016. No mediastinal shift. LEFT lung is clear. No pneumothorax. IMPRESSION: Minimal expansion of aerated RIGHT lung adjacent to the RIGHT hilum following thoracentesis. Persistent large volume fluid occupying the near entirety of the RIGHT hemi thorax. No pneumothorax appreciated. Electronically Signed   By: Suzy Bouchard M.D.   On: 01/27/2016 13:27   Dg Chest 2 View  Result Date: 02/04/2016 CLINICAL DATA:  Pneumothorax EXAM: CHEST  2 VIEW COMPARISON:  02/03/2016 FINDINGS: Right hydropneumothorax. The small to moderate pneumothorax component is considered un ex vacuo pneumothorax due to lack of re-expansion of the abnormal right lung. There is an increasing moderate to large pleural fluid component with multiple air-fluid levels, suggesting loculation. There has been interval removal of the apical chest tube. Stable indwelling basilar pleurodesis catheter. Left lung is essentially clear. The heart is normal in size. IMPRESSION: Right hydropneumothorax with increasing pleural fluid component  and multiple air-fluid levels, suggesting loculation. Underlying abnormal right lung with lack of re-expansion secondary to tumor. Stable indwelling pleurodesis catheter. Electronically Signed   By: Julian Hy M.D.   On: 02/04/2016 08:33   Dg Chest 2 View  Result Date: 02/01/2016 CLINICAL DATA:  Evaluate pleural effusion EXAM: CHEST  2 VIEW COMPARISON:  PA lateral  chest x-ray of January 30, 2016 FINDINGS: There remains a large left pleural effusion. Further decrease in the amount of aerated right lung is observed and likely less than 10% of the lung is now aerated. There is no mediastinal shift. The left lung is well-expanded. Interstitial markings on the left are mildly prominent. And no left pleural effusion is observed. The left heart border is normal. The pulmonary vascularity is not engorged. IMPRESSION: Further interval increase in the large right pleural effusion with only a small amount of aerated right lung remaining. Electronically Signed   By: David  Martinique M.D.   On: 02/01/2016 07:07   Dg Chest 2 View  Result Date: 01/30/2016 CLINICAL DATA:  Pleural effusion status post thoracentesis. EXAM: CHEST  2 VIEW COMPARISON:  01/29/2016, 01/27/2016 FINDINGS: Normal cardiac silhouette. Mild expansion of the RIGHT lower lobe following thoracentesis. Minimal expansion of RIGHT upper lobe. Large RIGHT effusion remains. LEFT lung is clear knee No pneumothorax. IMPRESSION: Mild expansion of the RIGHT lower lobe following thoracentesis. No pneumothorax. Large RIGHT effusion remains. Electronically Signed   By: Suzy Bouchard M.D.   On: 01/30/2016 11:04   Dg Chest 2 View  Result Date: 01/29/2016 CLINICAL DATA:  Status post thoracentesis on January 27, 2016. Persistent shortness of breath. No chest pain. EXAM: CHEST  2 VIEW COMPARISON:  Portable chest x-ray of January 27, 2016 FINDINGS: There remains near total opacification of the right hemi thorax. A small amount of aerated lung is noted in the right paratracheal region. The left lung is well-expanded. There is no focal infiltrate. There is no significant mediastinal shift. The left heart border is normal. The pulmonary vascularity is not engorged. IMPRESSION: Little change in the appearance of the nearly totally opacified right hemi thorax. A large pleural effusion likely remains. There is no pneumothorax. The left lung  is clear. Electronically Signed   By: David  Martinique M.D.   On: 01/29/2016 07:55   Mr Jeri Cos XT Contrast  Result Date: 01/30/2016 CLINICAL DATA:  Malignant pleural effusion.  Suspected Lung cancer. EXAM: MRI HEAD WITHOUT AND WITH CONTRAST TECHNIQUE: Multiplanar, multiecho pulse sequences of the brain and surrounding structures were obtained without and with intravenous contrast. CONTRAST:  53m MULTIHANCE GADOBENATE DIMEGLUMINE 529 MG/ML IV SOLN COMPARISON:  None. FINDINGS: No evidence for acute infarction, hemorrhage, mass lesion, hydrocephalus, or extra-axial fluid. Premature for age cerebral and cerebellar atrophy. Mild subcortical and periventricular T2 and FLAIR hyperintensities, likely chronic microvascular ischemic change. Pituitary, pineal, and cerebellar tonsils unremarkable. No upper cervical lesions. Flow voids are maintained throughout the carotid, basilar, and vertebral arteries. There are no areas of chronic hemorrhage. Post infusion, no abnormal enhancement of the brain or meninges. Visualized calvarium, skull base, and upper cervical osseous structures unremarkable. Scalp and extracranial soft tissues, orbits, sinuses, and mastoids show no acute process. IMPRESSION: Negative for intracranial metastatic disease. Mild atrophy and small vessel disease. Electronically Signed   By: JStaci RighterM.D.   On: 01/30/2016 21:42   Nm Pet Image Initial (pi) Skull Base To Thigh  Result Date: 02/22/2016 CLINICAL DATA:  Initial treatment strategy for RIGHT lung adenocarcinoma. EXAM: NUCLEAR MEDICINE PET  SKULL BASE TO THIGH TECHNIQUE: 8.2 mCi F-18 FDG was injected intravenously. Full-ring PET imaging was performed from the skull base to thigh after the radiotracer. CT data was obtained and used for attenuation correction and anatomic localization. FASTING BLOOD GLUCOSE:  Value: 122 mg/dl COMPARISON:  None FINDINGS: NECK No hypermetabolic lymph nodes in the neck. CHEST Rounded hypermetabolic mass in the RIGHT  lower lobe measures 7.4 x 3.9 cm with intense metabolic activity (SUV max equal 19). There is a moderate to large RIGHT hydro pneumothorax with chest tube in place. Mild hypermetabolic activity within the mildly thickened RIGHT pleural surface. Within the RIGHT upper lobe there is hypermetabolic thickening anterior to the fissure with intense metabolic activity (SUV max equal 14.3 closed. Within the LEFT lung there are discrete round pulmonary nodules. Example nodule measuring 16 mm (image 49, series 8) with associated metabolic activity with SUV max equal 9.5. Additional smaller more superior rounded peripheral nodules (image 35, series 8) also associated metabolic activity. Cluster of hypermetabolic nodules in the LEFT upper lobe along the anterior pleural space are hypermetabolic with SUV max equal 5.1. ABDOMEN/PELVIS Adrenal glands are normal. No abnormal metabolic activity liver. No hypermetabolic abdominal pelvic adenopathy. SKELETON No focal hypermetabolic activity to suggest skeletal metastasis. IMPRESSION: 1. Hypermetabolic RIGHT lower lobe mass with intense metabolic activity. 2. Multiple round peripheral hypermetabolic nodules within the LEFT upper lobe and LEFT lower lobe consistent with pulmonary metastasis. 3. Hypermetabolic pleural thickening within the RIGHT lung is either reactive or neoplastic. 4. Hypermetabolic thickening along the fissure of the RIGHT upper lobe is concerning for carcinoma. 5. Large hydro pneumothorax on the RIGHT with chest tube in place. Electronically Signed   By: Suzy Bouchard M.D.   On: 02/22/2016 12:23   Dg Chest Port 1 View  Result Date: 02/05/2016 CLINICAL DATA:  Right chest tube. EXAM: PORTABLE CHEST 1 VIEW COMPARISON:  02/04/2016. FINDINGS: Right chest tube in stable position. Stable right sided pneumothorax again noted without interim change. Right pleural effusion most likely present. Pleural fluid is less well identified on today's exam, this may be secondary  to semi-upright positioning the patient. Left lung is clear. Cardiomegaly. No acute bony abnormality. IMPRESSION: Scratched Right chest tube in stable position. Stable right pneumothorax. Right pleural effusion most likely present. Pleural fluid is less well identified on today's exam, this may be secondary to semi-upright positioning of the patient. Electronically Signed   By: Marcello Moores  Register   On: 02/05/2016 07:20   Dg Chest Port 1 View  Result Date: 02/03/2016 CLINICAL DATA:  Pneumothorax EXAM: PORTABLE CHEST 1 VIEW COMPARISON:  02/02/2016 FINDINGS: Stable right apical chest tube with moderate ex vacuo pneumothorax due to lack of re-expansion of the abnormal right upper lobe and right lower lobe secondary to underlying tumor. Associated moderate right pleural effusion, unchanged from the prior, improved from more remote studies. Left lung is clear. The heart is top-normal in size. Stable right IJ venous catheter. Right PleurX catheter is poorly visualized. IMPRESSION: Stable right apical chest tube with moderate ex vacuo pneumothorax due to lack of re-expansion of the abnormal right lung secondary to underlying tumor. Associated moderate right pleural effusion, unchanged from the recent prior. Electronically Signed   By: Julian Hy M.D.   On: 02/03/2016 07:24   Dg Chest Port 1 View  Result Date: 02/02/2016 CLINICAL DATA:  Adenocarcinoma of the right lung with malignant right pleural effusion and non re-expansion of the right lung. EXAM: PORTABLE CHEST 1 VIEW COMPARISON:  02/01/2016 FINDINGS: Large  caliber right-sided chest tube and additional PleurX catheter remain. There is some improved expansion of the right lower lung. There remains significant component of lack of re-expansion of the upper lobe is secondary to tumor and ex vacuo pneumothorax after pleural fluid evacuation. The left lung remains clear. Stable positioning of central line. IMPRESSION: Some improved expansion of the right lower  lung noted compared to the prior chest x-ray. There remains lack of re-expansion of the right upper lobe secondary to malignancy and therefore the remaining pneumothorax remains an ex vacuo pneumothorax. Electronically Signed   By: Aletta Edouard M.D.   On: 02/02/2016 08:11   Dg Chest Port 1 View  Result Date: 02/01/2016 CLINICAL DATA:  Status post VATS EXAM: PORTABLE CHEST 1 VIEW COMPARISON:  None. FINDINGS: There is a large caliber right-sided chest tube in place along with a small caliber PleurX drainage type catheter. Despite this there is a large right pneumothorax. The right IJ catheter tip is in the distal SVC. The left lung is relatively clear. IMPRESSION: Right-sided chest tubes in good position but there is a 50% pneumothorax. Right IJ catheter tip is in the distal SVC. These results were called by telephone at the time of interpretation on 02/01/2016 at 10:22 am to Dr. Ivin Poot , who verbally acknowledged these results. Electronically Signed   By: Marijo Sanes M.D.   On: 02/01/2016 10:23   US Thoracentesis Asp Pleural Space W/img Guide  Result Date: 01/30/2016 INDICATION: Recurrent malignant right pleural effusion. Request repeat thoracentesis. EXAM: ULTRASOUND GUIDED RIGHT THORACENTESIS MEDICATIONS: None. COMPLICATIONS: None immediate. PROCEDURE: An ultrasound guided thoracentesis was thoroughly discussed with the patient and questions answered. The benefits, risks, alternatives and complications were also discussed. The patient understands and wishes to proceed with the procedure. Written consent was obtained. Ultrasound was performed to localize and mark an adequate pocket of fluid in the right chest. The area was then prepped and draped in the normal sterile fashion. 1% Lidocaine was used for local anesthesia. Under ultrasound guidance a Safe-T-Centesis catheter was introduced. Thoracentesis was performed. The patient complained of significant discomfort and chest tightness on the right  side. The procedure was stopped at this point. The catheter was removed and a dressing applied. FINDINGS: A total of approximately 2 L of dark bloody fluid was removed. Samples were sent to the laboratory as requested by the clinical team. Residual effusion remains. IMPRESSION: Successful ultrasound guided right thoracentesis yielding 2 L of pleural fluid. Read by: Ascencion Dike PA-C Electronically Signed   By: Sandi Mariscal M.D.   On: 01/30/2016 10:45   US Thoracentesis Asp Pleural Space W/img Guide  Result Date: 01/27/2016 INDICATION: Shortness of breath. Large right pleural effusion. Request repeat right thoracentesis. EXAM: ULTRASOUND GUIDED RIGHT THORACENTESIS MEDICATIONS: None. COMPLICATIONS: None immediate. PROCEDURE: An ultrasound guided thoracentesis was thoroughly discussed with the patient and questions answered. The benefits, risks, alternatives and complications were also discussed. The patient understands and wishes to proceed with the procedure. Written consent was obtained. Ultrasound was performed to localize and mark an adequate pocket of fluid in the right chest. The area was then prepped and draped in the normal sterile fashion. 1% Lidocaine was used for local anesthesia. Under ultrasound guidance a Safe-T-Centesis catheter was introduced. Thoracentesis was performed. The catheter was removed and a dressing applied. FINDINGS: A total of approximately 3 L of bloody pleural fluid was removed. IMPRESSION: Successful ultrasound guided right thoracentesis yielding 3 L of pleural fluid. Read by: Ascencion Dike PA-C Electronically Signed  By: Lucrezia Europe M.D.   On: 01/27/2016 13:11    ASSESSMENT AND PLAN: This is a very pleasant 55 years old white male recently diagnosed with a stage IV non-small cell lung cancer, adenocarcinoma with negative EGFR, ALK, ROS 1 and BRAF diagnosed in September 2017 and presented with large right lower lobe lung mass in addition to large right pleural effusion and right  upper lobe as well as left lung pulmonary nodules. I had a lengthy discussion with the patient today about his current disease stage, prognosis and treatment options. I gave the patient the option of palliative care and hospice referral versus consideration of systemic chemotherapy with carboplatin for AUC of 5, Alimta 500 MG/M2 and Avastin 15 MG/KG every 3 weeks according to the Via pathway. The patient is interested in treatment. I discussed with the patient adverse effects of this treatment including but not limited to alopecia, myelosuppression, nausea and vomiting, peripheral neuropathy, liver or renal dysfunction in addition to the adverse effect of Avastin including risk for pulmonary hemorrhage, gastrointestinal perforation, wound healing delay, hypertension and proteinuria. We will arrange for the patient to receive vitamin B 12 injection today. The patient would also receive prescription for Compazine 10 mg by mouth every 6 hours as needed for nausea, Decadron 4 mg by mouth twice a day, the day before, day of and day after the chemotherapy in addition to folic acid 1 mg by mouth daily and Emla cream. I will arrange for the patient to have a chemotherapy education class before starting the first dose of the chemotherapy. I will also refer the patient to Dr. Prescott Gum for consideration of Port-A-Cath placement He is expected to start the first dose of his treatment next week. The patient would come back for follow-up visit in 2 weeks for evaluation and discussion of his treatment options. He was advised to call immediately if he has any concerning symptoms in the interval. The patient voices understanding of current disease status and treatment options and is in agreement with the current care plan.  All questions were answered. The patient knows to call the clinic with any problems, questions or concerns. We can certainly see the patient much sooner if necessary.  I spent 15 minutes  counseling the patient face to face. The total time spent in the appointment was 25 minutes.  Disclaimer: This note was dictated with voice recognition software. Similar sounding words can inadvertently be transcribed and may not be corrected upon review.

## 2016-02-26 NOTE — Progress Notes (Signed)
START ON PATHWAY REGIMEN - Non-Small Cell Lung  [id number]: Carboplatin AUC=5 + Pemetrexed 500 mg/m2 + Bevacizumab 15 mg/kg q21 Days x 4 Cycles   A cycle is every 21 days:     Carboplatin (Paraplatin(R)) AUC=5 in 250 mL NS IV over 1 hour Dose Mod: None     Pemetrexed (Alimta(R)) 500 mg/m2 in 100 mL NS IV over 10 minutes, manufacturer recommends not administering to patients with CrCl < 45 mL/min Dose Mod: None     Bevacizumab (Avastin(R)) 15 mg/kg in 100 mL NS IV over 90 minutes first infusion, 60 minutes second infusion and 30 minutes all subsequent infusions if tolerated Dose Mod: None Additional Orders: * All AUC calculations intended to be used in [employer] formula Note: Patient to receive the following prior to the initiation of therapy: 1) Dexamethasone 4 mg orally twice daily x 6 doses.  First dose 24 hours before chemotherapy. 2) Folic acid >= 564 mcg orally daily.  First dose at least 5 days prior to the first dose of pemetrexed. 3) Vitamin B12 1,000 mcg intramuscularly every 9 weeks.  First dose at least 5 days prior to the first dose of pemetrexed.  **Always confirm dose/schedule in your pharmacy ordering system**    Patient Characteristics: Stage IV Metastatic, Non Squamous, Initial Chemotherapy/Immunotherapy, PS = 0, 1, PD-L1 Expression Positive 1-49% (TPS) / Negative / Not Tested / Not a Candidate for Immunotherapy AJCC M Stage: 1 AJCC N Stage: 0 AJCC T Stage: 3 Current Disease Status: Distant Metastases AJCC Stage Grouping: IV Histology: Non Squamous Cell ROS1 Rearrangement Status: Negative T790M Mutation Status: Not Applicable - EGFR Mutation Negative/Unknown Other Mutations/Biomarkers: No Other Actionable Mutations PD-L1 Expression Status: Quantity Not Sufficient Chemotherapy/Immunotherapy LOT: Initial Chemotherapy/Immunotherapy Molecular Targeted Therapy: Not Appropriate ALK Translocation Status: Negative Would you be surprised if this patient died  in the next year? I  would NOT be surprised if this patient died in the next year EGFR Mutation Status: Negative/Wild Type Performance Status: PS = 0, 1  Intent of Therapy: Non-Curative / Palliative Intent, Discussed with Patient

## 2016-02-28 ENCOUNTER — Encounter (HOSPITAL_COMMUNITY): Payer: Self-pay

## 2016-03-03 ENCOUNTER — Telehealth: Payer: Self-pay | Admitting: Internal Medicine

## 2016-03-03 NOTE — Telephone Encounter (Signed)
LEFT MESSAGE FOR PATIENT RE NEXT APPOINTMENTS FOR 10/3 AND 10/4. CHED MOVED TO 10/3 DUE TO [facility] STARTING 10/4. PATIENT TO GET SCHEDULE AT 10/3 VISIT.

## 2016-03-03 NOTE — Telephone Encounter (Signed)
Pt returned call, informed him of appointment changes. Pt informed of chemo education on 10/3 then lab/treatment on 10/4. Pt has port placement scheduled for 10/5, asks if that will be an issue with the current schedule. Informed him treatment can be done peripherally for cycle 1. Instructed him to drink plenty of fluids before first treatment he agrees to do so.

## 2016-03-04 ENCOUNTER — Other Ambulatory Visit: Payer: Medicare HMO

## 2016-03-04 ENCOUNTER — Encounter: Payer: Self-pay | Admitting: *Deleted

## 2016-03-04 NOTE — Pre-Procedure Instructions (Signed)
p   GAILEN VENNE  03/04/2016      [id number] [address] [place]Phone: [phone]: [phone]   Your procedure is scheduled on Thursday October 5.  [facility]  Call this number if you have problems the morning of surgery:  [phone]   Remember:  Do not eat food or drink liquids after midnight.  Take these medicines the morning of surgery with A SIP OF WATER: metoprolol (lopsressor), acetaminophen (tylenol) or hydrocodone (Norco) if needed or tramadol (ultram) if needed, Xopenex if needed  7 days prior to surgery STOP taking any Aspirin, Aleve, Naproxen, Ibuprofen, Motrin, Advil, Goody's, BC's, all herbal medications, fish oil, and all vitamins    Do not wear jewelry, make-up or nail polish.  Do not wear lotions, powders, or perfumes, or deoderant.  Do not shave 48 hours prior to surgery.  Men may shave face and neck.  Do not bring valuables to the hospital.  [facility] or [facility]ables.  Contacts, dentures or bridgework may not be worn into surgery.  Leave your suitcase in the car.  After surgery it may be brought to your room.  For patients admitted to the hospital, discharge time will be determined by your treatment team.  Patients discharged the day of surgery will not be allowed to drive home.    Special instructions:    [facility]- Preparing For Surgery  Before surgery, you can play an important role. Because skin is not sterile, your skin needs to be as free of germs as possible. You can reduce the number of germs on your skin by washing with CHG (chlorahexidine gluconate) Soap before surgery.  CHG is an antiseptic cleaner which kills germs and bonds with the skin to continue killing germs even after washing.  Please do not use if you have an allergy to CHG or antibacterial  soaps. If your skin becomes reddened/irritated stop using the CHG.  Do not shave (including legs and underarms) for at least 48 hours prior to first CHG shower. It is OK to shave your face.  Please follow these instructions carefully.   1. Shower the NIGHT BEFORE SURGERY and the MORNING OF SURGERY with CHG.   2. If you chose to wash your hair, wash your hair first as usual with your normal shampoo.  3. After you shampoo, rinse your hair and body thoroughly to remove the shampoo.  4. Use CHG as you would any other liquid soap. You can apply CHG directly to the skin and wash gently with a scrungie or a clean washcloth.   5. Apply the CHG Soap to your body ONLY FROM THE NECK DOWN.  Do not use on open wounds or open sores. Avoid contact with your eyes, ears, mouth and genitals (private parts). Wash genitals (private parts) with your normal soap.  6. Wash thoroughly, paying special attention to the area where your surgery will be performed.  7. Thoroughly rinse your body with warm water from the neck down.  8. DO NOT shower/wash with your normal soap after using and rinsing off the CHG Soap.  9. Pat yourself dry with a CLEAN TOWEL.   10. Wear CLEAN PAJAMAS   11. Place CLEAN SHEETS on your bed the night of your first shower and DO NOT SLEEP WITH PETS.    Day  of Surgery: Do not apply any deodorants/lotions. Please wear clean clothes to the hospital/surgery center.

## 2016-03-05 ENCOUNTER — Encounter (HOSPITAL_COMMUNITY): Payer: Self-pay

## 2016-03-05 ENCOUNTER — Ambulatory Visit (HOSPITAL_COMMUNITY)
Admission: RE | Admit: 2016-03-05 | Discharge: 2016-03-05 | Disposition: A | Discharge status: Home / Self Care | Payer: Medicare HMO | Source: Ambulatory Visit | Attending: Cardiothoracic Surgery | Admitting: Cardiothoracic Surgery

## 2016-03-05 ENCOUNTER — Ambulatory Visit (HOSPITAL_BASED_OUTPATIENT_CLINIC_OR_DEPARTMENT_OTHER): Payer: Medicare HMO

## 2016-03-05 ENCOUNTER — Encounter: Payer: Self-pay | Admitting: *Deleted

## 2016-03-05 ENCOUNTER — Encounter (HOSPITAL_COMMUNITY)
Admission: RE | Admit: 2016-03-05 | Discharge: 2016-03-05 | Disposition: A | Discharge status: Home / Self Care | Payer: Medicare HMO | Source: Ambulatory Visit | Attending: Cardiothoracic Surgery | Admitting: Cardiothoracic Surgery

## 2016-03-05 ENCOUNTER — Other Ambulatory Visit: Payer: Medicare HMO

## 2016-03-05 VITALS — BP 112/78 | HR 96 | Temp 98.0°F | Resp 16

## 2016-03-05 DIAGNOSIS — C3491 Malignant neoplasm of unspecified part of right bronchus or lung: Secondary | ICD-10-CM

## 2016-03-05 DIAGNOSIS — R918 Other nonspecific abnormal finding of lung field: Secondary | ICD-10-CM | POA: Diagnosis not present

## 2016-03-05 DIAGNOSIS — Z01812 Encounter for preprocedural laboratory examination: Secondary | ICD-10-CM | POA: Diagnosis not present

## 2016-03-05 DIAGNOSIS — C3490 Malignant neoplasm of unspecified part of unspecified bronchus or lung: Secondary | ICD-10-CM

## 2016-03-05 DIAGNOSIS — C3432 Malignant neoplasm of lower lobe, left bronchus or lung: Secondary | ICD-10-CM | POA: Diagnosis not present

## 2016-03-05 DIAGNOSIS — Z5111 Encounter for antineoplastic chemotherapy: Secondary | ICD-10-CM

## 2016-03-05 DIAGNOSIS — Z01818 Encounter for other preprocedural examination: Secondary | ICD-10-CM | POA: Insufficient documentation

## 2016-03-05 DIAGNOSIS — J948 Other specified pleural conditions: Secondary | ICD-10-CM | POA: Insufficient documentation

## 2016-03-05 DIAGNOSIS — J90 Pleural effusion, not elsewhere classified: Secondary | ICD-10-CM

## 2016-03-05 HISTORY — DX: Tachycardia, unspecified: R00.0

## 2016-03-05 LAB — CBC WITH DIFFERENTIAL/PLATELET
Basophils Absolute: 0 K/uL (ref 0.0–0.1)
Basophils Relative: 0 %
Eosinophils Absolute: 0.1 K/uL (ref 0.0–0.7)
Eosinophils Relative: 1 %
HCT: 40.1 % (ref 39.0–52.0)
Hemoglobin: 12.6 g/dL — ABNORMAL LOW (ref 13.0–17.0)
Lymphocytes Relative: 10 %
Lymphs Abs: 0.7 K/uL (ref 0.7–4.0)
MCH: 28.9 pg (ref 26.0–34.0)
MCHC: 31.4 g/dL (ref 30.0–36.0)
MCV: 92 fL (ref 78.0–100.0)
Monocytes Absolute: 0.6 K/uL (ref 0.1–1.0)
Monocytes Relative: 8 %
Neutro Abs: 6.1 K/uL (ref 1.7–7.7)
Neutrophils Relative %: 81 %
Platelets: 304 K/uL (ref 150–400)
RBC: 4.36 MIL/uL (ref 4.22–5.81)
RDW: 14.4 % (ref 11.5–15.5)
WBC: 7.5 K/uL (ref 4.0–10.5)

## 2016-03-05 LAB — COMPREHENSIVE METABOLIC PANEL
ALT: 14 U/L — ABNORMAL LOW (ref 17–63)
AST: 22 U/L (ref 15–41)
Albumin: 2.8 g/dL — ABNORMAL LOW (ref 3.5–5.0)
Alkaline Phosphatase: 79 U/L (ref 38–126)
Anion gap: 7 (ref 5–15)
BUN: <5 mg/dL — ABNORMAL LOW (ref 6–20)
CO2: 27 mmol/L (ref 22–32)
Calcium: 8.7 mg/dL — ABNORMAL LOW (ref 8.9–10.3)
Chloride: 105 mmol/L (ref 101–111)
Creatinine, Ser: 0.98 mg/dL (ref 0.61–1.24)
GFR calc Af Amer: >60 mL/min (ref >60)
GFR calc non Af Amer: >60 mL/min (ref >60)
Glucose, Bld: 110 mg/dL — ABNORMAL HIGH (ref 65–99)
Potassium: 4.1 mmol/L (ref 3.5–5.1)
Sodium: 139 mmol/L (ref 135–145)
Total Bilirubin: 0.4 mg/dL (ref 0.3–1.2)
Total Protein: 6.6 g/dL (ref 6.5–8.1)

## 2016-03-05 LAB — UA PROTEIN, DIPSTICK - CHCC

## 2016-03-05 LAB — APTT: aPTT: 35 s (ref 24–36)

## 2016-03-05 LAB — PROTIME-INR
INR: 1.24
Prothrombin Time: 15.7 seconds — ABNORMAL HIGH (ref 11.4–15.2)

## 2016-03-05 MED ORDER — PALONOSETRON HCL INJECTION 0.25 MG/5ML
0.2500 mg | Freq: Once | INTRAVENOUS | Status: AC
  Administered 2016-03-05: 0.25 mg via INTRAVENOUS

## 2016-03-05 MED ORDER — SODIUM CHLORIDE 0.9 % IV SOLN
602.5000 mg | Freq: Once | INTRAVENOUS | Status: AC
  Administered 2016-03-05: 600 mg via INTRAVENOUS
  Filled 2016-03-05: qty 60

## 2016-03-05 MED ORDER — DEXAMETHASONE SODIUM PHOSPHATE 100 MG/10ML IJ SOLN
10.0000 mg | Freq: Once | INTRAMUSCULAR | Status: AC
  Administered 2016-03-05: 10 mg via INTRAVENOUS
  Filled 2016-03-05: qty 1

## 2016-03-05 MED ORDER — SODIUM CHLORIDE 0.9 % IV SOLN
525.0000 mg/m2 | Freq: Once | INTRAVENOUS | Status: AC
  Administered 2016-03-05: 1000 mg via INTRAVENOUS
  Filled 2016-03-05: qty 20

## 2016-03-05 MED ORDER — PALONOSETRON HCL INJECTION 0.25 MG/5ML
INTRAVENOUS | Status: AC
  Filled 2016-03-05: qty 5

## 2016-03-05 MED ORDER — SODIUM CHLORIDE 0.9 % IV SOLN
Freq: Once | INTRAVENOUS | Status: AC
  Administered 2016-03-05: 15:00:00 via INTRAVENOUS

## 2016-03-05 MED ORDER — SODIUM CHLORIDE 0.9 % IV SOLN: 15.0000 mg/kg | Freq: Once | INTRAVENOUS | Status: DC

## 2016-03-05 NOTE — Progress Notes (Signed)
Avastin held today due to port placement tomorrow per MD Julien Nordmann

## 2016-03-05 NOTE — Patient Instructions (Addendum)
[facility] Discharge Instructions for Patients Receiving Chemotherapy  Today you received the following chemotherapy agents Alimta and Carboplatin  To help prevent nausea and vomiting after your treatment, we encourage you to take your nausea medication as directed. Take Compazine for nausea.    If you develop nausea and vomiting that is not controlled by your nausea medication, call the clinic.   BELOW ARE SYMPTOMS THAT SHOULD BE REPORTED IMMEDIATELY:  *FEVER GREATER THAN 100.5 F  *CHILLS WITH OR WITHOUT FEVER  NAUSEA AND VOMITING THAT IS NOT CONTROLLED WITH YOUR NAUSEA MEDICATION  *UNUSUAL SHORTNESS OF BREATH  *UNUSUAL BRUISING OR BLEEDING  TENDERNESS IN MOUTH AND THROAT WITH OR WITHOUT PRESENCE OF ULCERS  *URINARY PROBLEMS  *BOWEL PROBLEMS  UNUSUAL RASH Items with * indicate a potential emergency and should be followed up as soon as possible.  Feel free to call the clinic you have any questions or concerns. The clinic phone number is [phone].  Please show the [place] at check-in to the Emergency Department and triage nurse.   Pemetrexed injection What is this medicine? PEMETREXED (PEM e TREX ed) is a chemotherapy drug. This medicine affects cells that are rapidly growing, such as cancer cells and cells in your mouth and stomach. It is usually used to treat lung cancers like non-small cell lung cancer and mesothelioma. It may also be used to treat other cancers. This medicine may be used for other purposes; ask your health care provider or pharmacist if you have questions. What should I tell my health care provider before I take this medicine? They need to know if you have any of these conditions: -if you frequently drink alcohol containing beverages -infection (especially a virus infection such as chickenpox, cold sores, or herpes) -kidney disease -liver disease -low blood counts, like low platelets, red bloods, or white blood cells -an  unusual or allergic reaction to pemetrexed, mannitol, other medicines, foods, dyes, or preservatives -pregnant or trying to get pregnant -breast-feeding How should I use this medicine? This drug is given as an infusion into a vein. It is administered in a hospital or clinic by a specially trained health care professional. Talk to your pediatrician regarding the use of this medicine in children. Special care may be needed. Overdosage: If you think you have taken too much of this medicine contact a poison control center or emergency room at once. NOTE: This medicine is only for you. Do not share this medicine with others. What if I miss a dose? It is important not to miss your dose. Call your doctor or health care professional if you are unable to keep an appointment. What may interact with this medicine? -aspirin and aspirin-like medicines -medicines to increase blood counts like filgrastim, pegfilgrastim, sargramostim -methotrexate -NSAIDS, medicines for pain and inflammation, like ibuprofen or naproxen -probenecid -pyrimethamine -vaccines Talk to your doctor or health care professional before taking any of these medicines: -acetaminophen -aspirin -ibuprofen -ketoprofen -naproxen This list may not describe all possible interactions. Give your health care provider a list of all the medicines, herbs, non-prescription drugs, or dietary supplements you use. Also tell them if you smoke, drink alcohol, or use illegal drugs. Some items may interact with your medicine. What should I watch for while using this medicine? Visit your doctor for checks on your progress. This drug may make you feel generally unwell. This is not uncommon, as chemotherapy can affect healthy cells as well as cancer cells. Report any side effects. Continue your course  of treatment even though you feel ill unless your doctor tells you to stop. In some cases, you may be given additional medicines to help with side effects.  Follow all directions for their use. Call your doctor or health care professional for advice if you get a fever, chills or sore throat, or other symptoms of a cold or flu. Do not treat yourself. This drug decreases your body's ability to fight infections. Try to avoid being around people who are sick. This medicine may increase your risk to bruise or bleed. Call your doctor or health care professional if you notice any unusual bleeding. Be careful brushing and flossing your teeth or using a toothpick because you may get an infection or bleed more easily. If you have any dental work done, tell your dentist you are receiving this medicine. Avoid taking products that contain aspirin, acetaminophen, ibuprofen, naproxen, or ketoprofen unless instructed by your doctor. These medicines may hide a fever. Call your doctor or health care professional if you get diarrhea or mouth sores. Do not treat yourself. To protect your kidneys, drink water or other fluids as directed while you are taking this medicine. Men and women must use effective birth control while taking this medicine. You may also need to continue using effective birth control for a time after stopping this medicine. Do not become pregnant while taking this medicine. Tell your doctor right away if you think that you or your partner might be pregnant. There is a potential for serious side effects to an unborn child. Talk to your health care professional or pharmacist for more information. Do not breast-feed an infant while taking this medicine. This medicine may lower sperm counts. What side effects may I notice from receiving this medicine? Side effects that you should report to your doctor or health care professional as soon as possible: -allergic reactions like skin rash, itching or hives, swelling of the face, lips, or tongue -low blood counts - this medicine may decrease the number of white blood cells, red blood cells and platelets. You may be at  increased risk for infections and bleeding. -signs of infection - fever or chills, cough, sore throat, pain or difficulty passing urine -signs of decreased platelets or bleeding - bruising, pinpoint red spots on the skin, black, tarry stools, blood in the urine -signs of decreased red blood cells - unusually weak or tired, fainting spells, lightheadedness -breathing problems, like a dry cough -changes in emotions or moods -chest pain -confusion -diarrhea -high blood pressure -mouth or throat sores or ulcers -pain, swelling, warmth in the leg -pain on swallowing -swelling of the ankles, feet, hands -trouble passing urine or change in the amount of urine -vomiting -yellowing of the eyes or skin Side effects that usually do not require medical attention (report to your doctor or health care professional if they continue or are bothersome): -hair loss -loss of appetite -nausea -stomach upset This list may not describe all possible side effects. Call your doctor for medical advice about side effects. You may report side effects to FDA at 1-800-FDA-1088. Where should I keep my medicine? This drug is given in a hospital or clinic and will not be stored at home. NOTE: This sheet is a summary. It may not cover all possible information. If you have questions about this medicine, talk to your doctor, pharmacist, or health care provider.    2016, Elsevier/Gold Standard. (2007-12-21 13:24:03)  Carboplatin injection What is this medicine? CARBOPLATIN (KAR boe pla tin) is a chemotherapy  drug. It targets fast dividing cells, like cancer cells, and causes these cells to die. This medicine is used to treat ovarian cancer and many other cancers. This medicine may be used for other purposes; ask your health care provider or pharmacist if you have questions. What should I tell my health care provider before I take this medicine? They need to know if you have any of these conditions: -blood  disorders -hearing problems -kidney disease -recent or ongoing radiation therapy -an unusual or allergic reaction to carboplatin, cisplatin, other chemotherapy, other medicines, foods, dyes, or preservatives -pregnant or trying to get pregnant -breast-feeding How should I use this medicine? This drug is usually given as an infusion into a vein. It is administered in a hospital or clinic by a specially trained health care professional. Talk to your pediatrician regarding the use of this medicine in children. Special care may be needed. Overdosage: If you think you have taken too much of this medicine contact a poison control center or emergency room at once. NOTE: This medicine is only for you. Do not share this medicine with others. What if I miss a dose? It is important not to miss a dose. Call your doctor or health care professional if you are unable to keep an appointment. What may interact with this medicine? -medicines for seizures -medicines to increase blood counts like filgrastim, pegfilgrastim, sargramostim -some antibiotics like amikacin, gentamicin, neomycin, streptomycin, tobramycin -vaccines Talk to your doctor or health care professional before taking any of these medicines: -acetaminophen -aspirin -ibuprofen -ketoprofen -naproxen This list may not describe all possible interactions. Give your health care provider a list of all the medicines, herbs, non-prescription drugs, or dietary supplements you use. Also tell them if you smoke, drink alcohol, or use illegal drugs. Some items may interact with your medicine. What should I watch for while using this medicine? Your condition will be monitored carefully while you are receiving this medicine. You will need important blood work done while you are taking this medicine. This drug may make you feel generally unwell. This is not uncommon, as chemotherapy can affect healthy cells as well as cancer cells. Report any side effects.  Continue your course of treatment even though you feel ill unless your doctor tells you to stop. In some cases, you may be given additional medicines to help with side effects. Follow all directions for their use. Call your doctor or health care professional for advice if you get a fever, chills or sore throat, or other symptoms of a cold or flu. Do not treat yourself. This drug decreases your body's ability to fight infections. Try to avoid being around people who are sick. This medicine may increase your risk to bruise or bleed. Call your doctor or health care professional if you notice any unusual bleeding. Be careful brushing and flossing your teeth or using a toothpick because you may get an infection or bleed more easily. If you have any dental work done, tell your dentist you are receiving this medicine. Avoid taking products that contain aspirin, acetaminophen, ibuprofen, naproxen, or ketoprofen unless instructed by your doctor. These medicines may hide a fever. Do not become pregnant while taking this medicine. Women should inform their doctor if they wish to become pregnant or think they might be pregnant. There is a potential for serious side effects to an unborn child. Talk to your health care professional or pharmacist for more information. Do not breast-feed an infant while taking this medicine. What side effects may  I notice from receiving this medicine? Side effects that you should report to your doctor or health care professional as soon as possible: -allergic reactions like skin rash, itching or hives, swelling of the face, lips, or tongue -signs of infection - fever or chills, cough, sore throat, pain or difficulty passing urine -signs of decreased platelets or bleeding - bruising, pinpoint red spots on the skin, black, tarry stools, nosebleeds -signs of decreased red blood cells - unusually weak or tired, fainting spells, lightheadedness -breathing problems -changes in  hearing -changes in vision -chest pain -high blood pressure -low blood counts - This drug may decrease the number of white blood cells, red blood cells and platelets. You may be at increased risk for infections and bleeding. -nausea and vomiting -pain, swelling, redness or irritation at the injection site -pain, tingling, numbness in the hands or feet -problems with balance, talking, walking -trouble passing urine or change in the amount of urine Side effects that usually do not require medical attention (report to your doctor or health care professional if they continue or are bothersome): -hair loss -loss of appetite -metallic taste in the mouth or changes in taste This list may not describe all possible side effects. Call your doctor for medical advice about side effects. You may report side effects to FDA at 1-800-FDA-1088. Where should I keep my medicine? This drug is given in a hospital or clinic and will not be stored at home. NOTE: This sheet is a summary. It may not cover all possible information. If you have questions about this medicine, talk to your doctor, pharmacist, or health care provider.    2016, Elsevier/Gold Standard. (2007-08-24 14:38:05)

## 2016-03-05 NOTE — Progress Notes (Signed)
Pt with appointment to [place] at [facility] for chemo and lab draws today. Spoke with RN Dixie at Cancer center and patient to have labs drawn at PAT appointment so as not to get stuck twice. Per Dr. Inda Merlin, pt requires CMET and CBC with Diff.   CXR: 03/05/16 EKG: 01/26/16 ECHO: 01/26/16  Pt with recent admission for pleural effusion, VATS and currently has pleurex-catheter placed.  Pt with no complaints of chest pain, SOB at this time.

## 2016-03-05 NOTE — Progress Notes (Signed)
Anesthesia Chart Review: Patient is a 55 year old male scheduled for insertion of Port-a-cath on 03/06/16 by Dr. Prescott Gum. Anesthesia type is posted for MAC. 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 He is a former smoker.  No PCP per patient. Oncologist is Dr. Julien Nordmann.   Meds include folic acid, Norco, Xopenex, Lopressor, tramadol. He is scheduled to start his first chemotherapy cycle on 03/05/16 via peripheral IV access.   BP 107/76   Pulse 97   Temp 36.8 C   Resp 20   Ht '5\' 10"'$  (1.778 m)   Wt 160 lb (72.6 kg)   SpO2 98%   BMI 22.96 kg/m   01/25/16 EKG (done during ED evaluation for SOB, large right pleural effusion; troponin negative X 4): ST at 128 bpm, possible RVH, septal infarct (age undetermined), lateral infarct (age undetermined), T wave abnormality, consider inferior ischemia.   01/26/16 Echo: Study Conclusions - Left ventricle: The cavity size was normal. There was mild   concentric hypertrophy. Systolic function was normal. The   estimated ejection fraction was in the range of 55% to 60%. Wall   motion was normal; there were no regional wall motion   abnormalities. Doppler parameters are consistent with abnormal   left ventricular relaxation (grade 1 diastolic dysfunction).   There was no evidence of elevated ventricular filling pressure by   Doppler parameters. - Aortic root: The aortic root was normal in size. - Ascending aorta: The ascending aorta was normal in size. - Mitral valve: There was mild regurgitation. - Left atrium: The atrium was at the upper limits of normal in   size. - Right ventricle: Systolic function was normal. - Right atrium: The atrium was normal in size. - Tricuspid valve: There was mild regurgitation. - Pulmonic valve: There was no regurgitation. - Pulmonary  arteries: Systolic pressure was within the normal   range. - Inferior vena cava: The vessel was normal in size. - Pericardium, extracardiac: There was no pericardial effusion.   There was a right pleural effusion. There was a left pleural   effusion. Impressions: - There is a very large right pleural effusion that is pushing   against the heart, as a result there is compression of the atria   and horizontal position of the heart. There doesn't appear to be   obstruction of flow.  03/05/16 CXR: IMPRESSION: 1. Stable loculated right basilar hydro pneumothorax. 2. No change in opacities throughout the somewhat atelectatic right lung and in the left mid lung and left lung base. These changes may be inflammatory or infectious, but a neoplastic process is a definite consideration.  Preoperative labs noted.    He tolerated VAT procedure last month. If no acute changes then I would anticipate that he could proceed as planned.  George Hugh [facility] Short Stay Center/Anesthesiology Phone [phone] 03/05/2016 2:33 PM

## 2016-03-06 ENCOUNTER — Ambulatory Visit (HOSPITAL_COMMUNITY): Payer: Medicare HMO

## 2016-03-06 ENCOUNTER — Encounter (HOSPITAL_COMMUNITY)
Admission: RE | Disposition: A | Discharge status: Home / Self Care | Payer: Self-pay | Source: Ambulatory Visit | Attending: Cardiothoracic Surgery

## 2016-03-06 ENCOUNTER — Ambulatory Visit (HOSPITAL_COMMUNITY)
Admission: RE | Admit: 2016-03-06 | Discharge: 2016-03-06 | Disposition: A | Discharge status: Home / Self Care | Payer: Medicare HMO | Source: Ambulatory Visit | Attending: Cardiothoracic Surgery | Admitting: Cardiothoracic Surgery

## 2016-03-06 ENCOUNTER — Ambulatory Visit (HOSPITAL_COMMUNITY): Payer: Medicare HMO | Admitting: Vascular Surgery

## 2016-03-06 ENCOUNTER — Other Ambulatory Visit: Payer: Self-pay | Admitting: Internal Medicine

## 2016-03-06 ENCOUNTER — Ambulatory Visit (HOSPITAL_COMMUNITY): Payer: Medicare HMO | Admitting: Certified Registered Nurse Anesthetist

## 2016-03-06 ENCOUNTER — Encounter (HOSPITAL_COMMUNITY): Payer: Self-pay | Admitting: Certified Registered Nurse Anesthetist

## 2016-03-06 DIAGNOSIS — Z79891 Long term (current) use of opiate analgesic: Secondary | ICD-10-CM | POA: Insufficient documentation

## 2016-03-06 DIAGNOSIS — Z7952 Long term (current) use of systemic steroids: Secondary | ICD-10-CM | POA: Diagnosis not present

## 2016-03-06 DIAGNOSIS — Z825 Family history of asthma and other chronic lower respiratory diseases: Secondary | ICD-10-CM | POA: Diagnosis not present

## 2016-03-06 DIAGNOSIS — C3432 Malignant neoplasm of lower lobe, left bronchus or lung: Secondary | ICD-10-CM

## 2016-03-06 DIAGNOSIS — Z791 Long term (current) use of non-steroidal anti-inflammatories (NSAID): Secondary | ICD-10-CM | POA: Insufficient documentation

## 2016-03-06 DIAGNOSIS — Z452 Encounter for adjustment and management of vascular access device: Secondary | ICD-10-CM | POA: Diagnosis present

## 2016-03-06 DIAGNOSIS — C3490 Malignant neoplasm of unspecified part of unspecified bronchus or lung: Secondary | ICD-10-CM

## 2016-03-06 DIAGNOSIS — Z419 Encounter for procedure for purposes other than remedying health state, unspecified: Secondary | ICD-10-CM

## 2016-03-06 DIAGNOSIS — Z79899 Other long term (current) drug therapy: Secondary | ICD-10-CM | POA: Diagnosis not present

## 2016-03-06 DIAGNOSIS — Z9889 Other specified postprocedural states: Secondary | ICD-10-CM | POA: Insufficient documentation

## 2016-03-06 DIAGNOSIS — R0602 Shortness of breath: Secondary | ICD-10-CM

## 2016-03-06 DIAGNOSIS — C3491 Malignant neoplasm of unspecified part of right bronchus or lung: Secondary | ICD-10-CM | POA: Insufficient documentation

## 2016-03-06 DIAGNOSIS — Z800 Family history of malignant neoplasm of digestive organs: Secondary | ICD-10-CM | POA: Diagnosis not present

## 2016-03-06 DIAGNOSIS — Z87891 Personal history of nicotine dependence: Secondary | ICD-10-CM | POA: Insufficient documentation

## 2016-03-06 DIAGNOSIS — J948 Other specified pleural conditions: Secondary | ICD-10-CM | POA: Diagnosis not present

## 2016-03-06 HISTORY — PX: PORTACATH PLACEMENT: SHX2246

## 2016-03-06 SURGERY — INSERTION, TUNNELED CENTRAL VENOUS DEVICE, WITH PORT
Anesthesia: Monitor Anesthesia Care | Site: Chest | Laterality: Right

## 2016-03-06 MED ORDER — PROCHLORPERAZINE MALEATE 10 MG PO TABS: 10.0000 mg | ORAL_TABLET | Freq: Four times a day (QID) | ORAL | Status: DC | PRN

## 2016-03-06 MED ORDER — SODIUM CHLORIDE 0.9% FLUSH: 3.0000 mL | INTRAVENOUS | Status: DC | PRN

## 2016-03-06 MED ORDER — IOPAMIDOL (ISOVUE-300) INJECTION 61%
INTRAVENOUS | Status: AC
  Filled 2016-03-06: qty 50

## 2016-03-06 MED ORDER — BISACODYL 5 MG PO TBEC: 10.0000 mg | DELAYED_RELEASE_TABLET | Freq: Every day | ORAL | Status: DC

## 2016-03-06 MED ORDER — 0.9 % SODIUM CHLORIDE (POUR BTL) OPTIME
TOPICAL | Status: DC | PRN
Start: 2016-03-06 — End: 2016-03-06
  Administered 2016-03-06: 1000 mL

## 2016-03-06 MED ORDER — ACETAMINOPHEN 500 MG PO TABS: 1000.0000 mg | ORAL_TABLET | Freq: Four times a day (QID) | ORAL | Status: DC

## 2016-03-06 MED ORDER — FENTANYL CITRATE (PF) 250 MCG/5ML IJ SOLN
INTRAMUSCULAR | Status: AC
  Filled 2016-03-06: qty 5

## 2016-03-06 MED ORDER — LEVALBUTEROL HCL 0.63 MG/3ML IN NEBU: 0.6300 mg | INHALATION_SOLUTION | Freq: Two times a day (BID) | RESPIRATORY_TRACT | Status: DC

## 2016-03-06 MED ORDER — FENTANYL CITRATE (PF) 100 MCG/2ML IJ SOLN: 25.0000 ug | INTRAMUSCULAR | Status: DC | PRN

## 2016-03-06 MED ORDER — MEPERIDINE HCL 25 MG/ML IJ SOLN: 6.2500 mg | INTRAMUSCULAR | Status: DC | PRN

## 2016-03-06 MED ORDER — ACETAMINOPHEN 325 MG PO TABS: 650.0000 mg | ORAL_TABLET | Freq: Four times a day (QID) | ORAL | Status: DC | PRN

## 2016-03-06 MED ORDER — POLYETHYLENE GLYCOL 3350 17 G PO PACK: 17.0000 g | PACK | Freq: Every day | ORAL | Status: DC | PRN

## 2016-03-06 MED ORDER — MIDAZOLAM HCL 2 MG/2ML IJ SOLN
INTRAMUSCULAR | Status: AC
  Filled 2016-03-06: qty 2

## 2016-03-06 MED ORDER — SODIUM CHLORIDE 0.9 % IV SOLN
INTRAVENOUS | Status: DC | PRN
  Administered 2016-03-06: 500 mL

## 2016-03-06 MED ORDER — HYDROCODONE-ACETAMINOPHEN 5-325 MG PO TABS: 1.0000 | ORAL_TABLET | ORAL | Status: DC | PRN

## 2016-03-06 MED ORDER — ACETAMINOPHEN 650 MG RE SUPP: 650.0000 mg | RECTAL | Status: DC | PRN

## 2016-03-06 MED ORDER — SODIUM CHLORIDE 0.9 % IV SOLN: 250.0000 mL | INTRAVENOUS | Status: DC | PRN

## 2016-03-06 MED ORDER — LIDOCAINE-PRILOCAINE 2.5-2.5 % EX CREA: 1.0000 "application " | TOPICAL_CREAM | Freq: Once | CUTANEOUS | Status: DC

## 2016-03-06 MED ORDER — DEXAMETHASONE 4 MG PO TABS
4.0000 mg | ORAL_TABLET | Freq: Every day | ORAL | Status: DC
Start: 2016-04-01 — End: 2016-03-06

## 2016-03-06 MED ORDER — PROMETHAZINE HCL 25 MG/ML IJ SOLN: 6.2500 mg | INTRAMUSCULAR | Status: DC | PRN

## 2016-03-06 MED ORDER — HEPARIN SOD (PORK) LOCK FLUSH 100 UNIT/ML IV SOLN
INTRAVENOUS | Status: DC | PRN
  Administered 2016-03-06: 500 [IU] via INTRAVENOUS

## 2016-03-06 MED ORDER — ACETAMINOPHEN 325 MG PO TABS
650.0000 mg | ORAL_TABLET | ORAL | Status: DC | PRN
Start: 2016-03-06 — End: 2016-03-06

## 2016-03-06 MED ORDER — ENSURE ENLIVE PO LIQD: 237.0000 mL | Freq: Two times a day (BID) | ORAL | Status: DC

## 2016-03-06 MED ORDER — MIDAZOLAM HCL 2 MG/2ML IJ SOLN: 0.5000 mg | Freq: Once | INTRAMUSCULAR | Status: DC | PRN

## 2016-03-06 MED ORDER — MIDAZOLAM HCL 5 MG/5ML IJ SOLN
INTRAMUSCULAR | Status: DC | PRN
  Administered 2016-03-06: 2 mg via INTRAVENOUS

## 2016-03-06 MED ORDER — HEPARIN SOD (PORK) LOCK FLUSH 100 UNIT/ML IV SOLN
INTRAVENOUS | Status: AC
  Filled 2016-03-06: qty 5

## 2016-03-06 MED ORDER — MULTI-VITAMIN/MINERALS PO TABS: 1.0000 | ORAL_TABLET | Freq: Every day | ORAL | Status: DC

## 2016-03-06 MED ORDER — TRAMADOL HCL 50 MG PO TABS: 50.0000 mg | ORAL_TABLET | Freq: Four times a day (QID) | ORAL | Status: DC | PRN

## 2016-03-06 MED ORDER — OXYCODONE HCL 5 MG PO TABS: 5.0000 mg | ORAL_TABLET | ORAL | Status: DC | PRN

## 2016-03-06 MED ORDER — METOPROLOL TARTRATE 25 MG PO TABS: 25.0000 mg | ORAL_TABLET | Freq: Two times a day (BID) | ORAL | Status: DC

## 2016-03-06 MED ORDER — DEXTROSE 5 % IV SOLN
1.5000 g | INTRAVENOUS | Status: AC
  Administered 2016-03-06: 1.5 g via INTRAVENOUS
  Filled 2016-03-06: qty 1.5

## 2016-03-06 MED ORDER — LIDOCAINE HCL (PF) 1 % IJ SOLN
INTRAMUSCULAR | Status: AC
  Filled 2016-03-06: qty 30

## 2016-03-06 MED ORDER — LACTATED RINGERS IV SOLN
INTRAVENOUS | Status: DC
  Administered 2016-03-06: 08:00:00 via INTRAVENOUS

## 2016-03-06 MED ORDER — PROPOFOL 500 MG/50ML IV EMUL
INTRAVENOUS | Status: DC | PRN
  Administered 2016-03-06: 75 ug/kg/min via INTRAVENOUS

## 2016-03-06 MED ORDER — FOLIC ACID 1 MG PO TABS: 1.0000 mg | ORAL_TABLET | Freq: Every day | ORAL | Status: DC

## 2016-03-06 MED ORDER — LIDOCAINE HCL (PF) 1 % IJ SOLN
INTRAMUSCULAR | Status: DC | PRN
  Administered 2016-03-06: 30 mL

## 2016-03-06 MED ORDER — SODIUM CHLORIDE 0.9% FLUSH: 3.0000 mL | Freq: Two times a day (BID) | INTRAVENOUS | Status: DC

## 2016-03-06 MED ORDER — FENTANYL CITRATE (PF) 100 MCG/2ML IJ SOLN
INTRAMUSCULAR | Status: DC | PRN
  Administered 2016-03-06 (×2): 50 ug via INTRAVENOUS

## 2016-03-06 SURGICAL SUPPLY — 43 items
BAG DECANTER FOR FLEXI CONT (MISCELLANEOUS) ×3 IMPLANT
BENZOIN TINCTURE PRP APPL 2/3 (GAUZE/BANDAGES/DRESSINGS) ×3 IMPLANT
BLADE SURG 11 STRL SS (BLADE) ×3 IMPLANT
CANISTER SUCTION 2500CC (MISCELLANEOUS) ×3 IMPLANT
CLOSURE STERI-STRIP 1/4X4 (GAUZE/BANDAGES/DRESSINGS) ×3 IMPLANT
CLOSURE WOUND 1/2 X4 (GAUZE/BANDAGES/DRESSINGS)
COVER SURGICAL LIGHT HANDLE (MISCELLANEOUS) ×6 IMPLANT
DRAPE C-ARM 42X72 X-RAY (DRAPES) ×3 IMPLANT
DRAPE CHEST BREAST 15X10 FENES (DRAPES) ×3 IMPLANT
DRAPE LAPAROTOMY TRNSV 102X78 (DRAPE) ×3 IMPLANT
ELECT CAUTERY BLADE 6.4 (BLADE) ×3 IMPLANT
ELECT REM PT RETURN 9FT ADLT (ELECTROSURGICAL) ×3
ELECTRODE REM PT RTRN 9FT ADLT (ELECTROSURGICAL) ×1 IMPLANT
GAUZE SPONGE 4X4 12PLY STRL (GAUZE/BANDAGES/DRESSINGS) ×3 IMPLANT
GLOVE BIO SURGEON STRL SZ 6.5 (GLOVE) ×2 IMPLANT
GLOVE BIO SURGEON STRL SZ7.5 (GLOVE) ×6 IMPLANT
GLOVE BIO SURGEONS STRL SZ 6.5 (GLOVE) ×1
GOWN STRL REUS W/ TWL LRG LVL3 (GOWN DISPOSABLE) ×1 IMPLANT
GOWN STRL REUS W/TWL LRG LVL3 (GOWN DISPOSABLE) ×2
GUIDEWIRE UNCOATED ST S 7038 (WIRE) IMPLANT
INTRODUCER 13FR (MISCELLANEOUS) IMPLANT
INTRODUCER COOK 11FR (CATHETERS) IMPLANT
KIT BASIN OR (CUSTOM PROCEDURE TRAY) ×3 IMPLANT
KIT PORT POWER 9.6FR MRI PREA (Catheter) ×3 IMPLANT
KIT ROOM TURNOVER OR (KITS) ×3 IMPLANT
NEEDLE 18GX1X1/2 (RX/OR ONLY) (NEEDLE) ×3 IMPLANT
NEEDLE 22X1 1/2 (OR ONLY) (NEEDLE) ×3 IMPLANT
NEEDLE 25GX 5/8IN NON SAFETY (NEEDLE) ×3 IMPLANT
NS IRRIG 1000ML POUR BTL (IV SOLUTION) ×3 IMPLANT
PACK GENERAL/GYN (CUSTOM PROCEDURE TRAY) ×3 IMPLANT
PAD ARMBOARD 7.5X6 YLW CONV (MISCELLANEOUS) ×6 IMPLANT
SET SHEATH INTRODUCER 10FR (MISCELLANEOUS) IMPLANT
STRIP CLOSURE SKIN 1/2X4 (GAUZE/BANDAGES/DRESSINGS) IMPLANT
SUT ETHILON 3 0 FSL (SUTURE) ×3 IMPLANT
SUT VIC AB 3-0 SH 8-18 (SUTURE) ×3 IMPLANT
SUT VIC AB 3-0 X1 27 (SUTURE) ×3 IMPLANT
SYR 20CC LL (SYRINGE) ×3 IMPLANT
SYR CONTROL 10ML LL (SYRINGE) ×3 IMPLANT
SYRINGE 10CC LL (SYRINGE) ×3 IMPLANT
TAPE CLOTH SURG 4X10 WHT LF (GAUZE/BANDAGES/DRESSINGS) ×3 IMPLANT
TOWEL OR 17X24 6PK STRL BLUE (TOWEL DISPOSABLE) ×3 IMPLANT
TOWEL OR 17X26 10 PK STRL BLUE (TOWEL DISPOSABLE) ×3 IMPLANT
WATER STERILE IRR 1000ML POUR (IV SOLUTION) ×3 IMPLANT

## 2016-03-06 NOTE — Transfer of Care (Signed)
Immediate Anesthesia Transfer of Care Note  Patient: Roger Barnes  Procedure(s) Performed: Procedure(s): INSERTION PORT-A-CATH (Right)  Patient Location: PACU  Anesthesia Type:MAC  Level of Consciousness: awake, alert  and oriented  Airway & Oxygen Therapy: Patient Spontanous Breathing  Post-op Assessment: Report given to RN  Post vital signs: Reviewed and stable  Last Vitals:  Vitals:   03/06/16 0830  BP: 117/90  Pulse: 88  Resp: 18  Temp: 36.9 C    Last Pain:  Vitals:   03/06/16 0830  TempSrc: Oral  PainSc:          Complications: No apparent anesthesia complications

## 2016-03-06 NOTE — Brief Op Note (Signed)
03/06/2016  11:21 AM  PATIENT:  Roger Barnes  55 y.o. male  PRE-OPERATIVE DIAGNOSIS:  NSCLC  RLL MASS  POST-OPERATIVE DIAGNOSIS:  NSCLC  RLL MASS  PROCEDURE:  Procedure(s): INSERTION PORT-A-CATH (Right)  SURGEON:  Surgeon(s) and Role:    * Ivin Poot, MD - Primary  PHYSICIAN ASSISTANT:   ASSISTANTS: none   ANESTHESIA:   MAC  EBL:  Total I/O In: 600 [I.V.:600] Out: 20 [Blood:20]  BLOOD ADMINISTERED:none  DRAINS: none   LOCAL MEDICATIONS USED:  LIDOCAINE  and Amount: 6 ml  SPECIMEN:  No Specimen  DISPOSITION OF SPECIMEN:  N/A  COUNTS:  YES  TOURNIQUET:  * No tourniquets in log *  DICTATION: .Dragon Dictation  PLAN OF CARE: Discharge to home after PACU  PATIENT DISPOSITION:  PACU - hemodynamically stable.   Delay start of Pharmacological VTE agent (>24hrs) due to surgical blood loss or risk of bleeding: yes

## 2016-03-06 NOTE — H&P (Addendum)
Cardiac and Thoracic Surgery-Cardiac [facility] copied text Hover for attribution information PCP is No PCP Per Patient Referring Provider is Elmarie Shiley, MD       Chief Complaint  Patient presents with         f/u from surgery with CXR s/p RT VATS,drainage of pleural effusion, placement of right pleurX cath 02/01/16      needs porta cath HPI: Patient returns for her first postop visit after right VATS for drainage of malignant effusion and pleurodesis and Pleurx catheter placement. The patient is preparing for treatment of stage IV adenocarcinoma by Dr. Julien Nordmann. A Port-A-Cath will be placed for chemotherapy on October 5.   The patient's right Pleurx catheter is working well. It is being drained every other day. Usually 350-4 100 cc of serosanguineous fluid. Patient has had no problems with shortness of breath.   Chest x-ray shows the catheter in good position. There is a space in the right hemithorax from an entrapped right lower lobe from tumor. The x-ray taken before today's drainage showed a hydropneumothorax.   The sutures placed for the Pleurx insertion were removed and the incisions are clean and dry       Past Medical History:  Diagnosis Date  . Adenocarcinoma of right lung, stage 4 ([place])    . Encounter for antineoplastic chemotherapy 02/26/2016  . Pleural effusion, right 01/25/2016           Past Surgical History:  Procedure Laterality Date  . CHEST TUBE INSERTION Right 02/01/2016    Procedure: INSERTION PLEURAL DRAINAGE CATHETER;  Surgeon: Ivin Poot, MD;  Location: [place];  Service: Thoracic;  Laterality: Right;  . PLEURADESIS Right 02/01/2016    Procedure: DOXYCYCLINE PLEURADESIS;  Surgeon: Ivin Poot, MD;  Location: [place];  Service: Thoracic;  Laterality: Right;  . THORACENTESIS Right 01/25/2016  . VIDEO ASSISTED THORACOSCOPY Right 02/01/2016    Procedure: VIDEO ASSISTED THORACOSCOPY;  Surgeon: Ivin Poot, MD;  Location: [place];  Service:  Thoracic;  Laterality: Right;  Marland Kitchen VIDEO BRONCHOSCOPY N/A 02/01/2016    Procedure: VIDEO BRONCHOSCOPY;  Surgeon: Ivin Poot, MD;  Location: [facility] OR;  Service: Thoracic;  Laterality: N/A;  . WISDOM TOOTH EXTRACTION                Family History  Problem Relation Age of Onset  . Cancer Father        oral  . COPD Maternal Grandmother        Social History       Social History  Substance Use Topics  . Smoking status: Former Research scientist (life sciences)  . Smokeless tobacco: Never Used        Comment: 01/25/2016 "haven't smoked since <2007"  . Alcohol use No            Current Outpatient Prescriptions  Medication Sig Dispense Refill  . acetaminophen (TYLENOL) 325 MG tablet Take 2 tablets (650 mg total) by mouth every 6 (six) hours as needed for mild pain (or Fever >/= 101). 10 tablet 0  . bisacodyl (DULCOLAX) 5 MG EC tablet Take 2 tablets (10 mg total) by mouth daily. 30 tablet 0  . dexamethasone (DECADRON) 4 MG tablet 4 mg by mouth twice a day the day before, day of and day after the chemotherapy every 3 weeks 40 tablet 1  . feeding supplement, ENSURE ENLIVE, (ENSURE ENLIVE) LIQD Take 237 mLs by mouth 2 (two) times daily between meals. 237 mL  12  . folic acid (FOLVITE) 1 MG tablet Take 1 tablet (1 mg total) by mouth daily. 30 tablet 4  . HYDROcodone-acetaminophen (NORCO/VICODIN) 5-325 MG tablet Take 1-2 tablets by mouth every 4 (four) hours as needed for moderate pain. 30 tablet 0  . levalbuterol (XOPENEX) 0.63 MG/3ML nebulizer solution Take 3 mLs (0.63 mg total) by nebulization 2 (two) times daily. 3 mL 12  . lidocaine-prilocaine (EMLA) cream Apply 1 application topically as needed. 30 g 0  . metoprolol tartrate (LOPRESSOR) 25 MG tablet Take 1 tablet (25 mg total) by mouth 2 (two) times daily. 60 tablet 0  . Multiple Vitamins-Minerals (MULTIVITAMIN WITH MINERALS) tablet Take 1 tablet by mouth daily.      . polyethylene glycol (MIRALAX / GLYCOLAX) packet Take 17 g by mouth daily as needed for mild  constipation. 14 each 0  . prochlorperazine (COMPAZINE) 10 MG tablet Take 1 tablet (10 mg total) by mouth every 6 (six) hours as needed for nausea or vomiting. 30 tablet 0  . traMADol (ULTRAM) 50 MG tablet Take 1 tablet (50 mg total) by mouth every 6 (six) hours as needed (mild pain). 30 tablet 0    No current facility-administered medications for this visit.           Allergies  Allergen Reactions  . No Known Allergies        Review of Systems  No shortness of breath No fever No cough or hemoptysis Slight weight loss since surgery   BP 125/80 (BP Location: Right Arm, Patient Position: Sitting, Cuff Size: Normal)   Pulse (!) 110   Resp 20   Ht '5\' 10"'$  (1.778 m)   Wt 160 lb (72.6 kg)   SpO2 98% Comment: RA  BMI 22.96 kg/m  Physical Exam        Exam     General- alert and comfortable   Lungs- clear without rales, wheezes   Cor- regular rate and rhythm, no murmur , gallop   Abdomen- soft, non-tender   Extremities - warm, non-tender, minimal edema   Neuro- oriented, appropriate, no focal weakness       Diagnostic Tests: Chest x-ray reviewed showing entrapped right lower lobe with resulting right hydropneumothorax Impression: Pleurx catheter functioning well Continue every other day drainage schedule Port-A-Cath insertion procedure discussed with patient including benefits and risks. Plan:  Plan porta cath placement on T Ko[place] today    Len Childs, MD [facility] [phone]

## 2016-03-06 NOTE — Progress Notes (Signed)
Dr. Prescott Gum at bedside for chest x-ray states x-ray is good

## 2016-03-06 NOTE — Anesthesia Postprocedure Evaluation (Signed)
Anesthesia Post Note  Patient: Roger Barnes  Procedure(s) Performed: Procedure(s) (LRB): INSERTION PORT-A-CATH (Right)  Patient location during evaluation: PACU Anesthesia Type: MAC Level of consciousness: awake and alert, oriented and patient cooperative Pain management: pain level controlled Vital Signs Assessment: post-procedure vital signs reviewed and stable Respiratory status: spontaneous breathing, nonlabored ventilation, respiratory function stable and patient connected to nasal cannula oxygen Cardiovascular status: blood pressure returned to baseline and stable Postop Assessment: no signs of nausea or vomiting Anesthetic complications: no    Last Vitals:  Vitals:   03/06/16 1109 03/06/16 1130  BP: 113/79 121/83  Pulse: 84 79  Resp: 16 18  Temp:      Last Pain:  Vitals:   03/06/16 1120  TempSrc:   PainSc: 0-No pain                 Nikayla Madaris,E. Lareina Espino

## 2016-03-06 NOTE — Anesthesia Preprocedure Evaluation (Signed)
Anesthesia Evaluation  Patient identified by MRN, date of birth, ID band Patient awake    Reviewed: Allergy & Precautions, NPO status , Patient's Chart, lab work & pertinent test results, reviewed documented beta blocker date and time   History of Anesthesia Complications Negative for: history of anesthetic complications  Airway Mallampati: I  TM Distance: >3 FB Neck ROM: Full    Dental  (+) Dental Advisory Given   Pulmonary shortness of breath, former smoker,  NSCLC: pleurex on R   breath sounds clear to auscultation       Cardiovascular (-) hypertension+ dysrhythmias (controlled with metoprolol) Supra Ventricular Tachycardia  Rhythm:Regular Rate:Normal  8/17 ECHO: EF 55-60%, mild MR   Neuro/Psych negative neurological ROS     GI/Hepatic negative GI ROS, Neg liver ROS,   Endo/Other  negative endocrine ROS  Renal/GU negative Renal ROS     Musculoskeletal   Abdominal   Peds  Hematology Chemo yesterday   Anesthesia Other Findings   Reproductive/Obstetrics                             Anesthesia Physical Anesthesia Plan  ASA: III  Anesthesia Plan: MAC   Post-op Pain Management:    Induction: Intravenous  Airway Management Planned: Natural Airway and Simple Face Mask  Additional Equipment:   Intra-op Plan:   Post-operative Plan:   Informed Consent: I have reviewed the patients History and Physical, chart, labs and discussed the procedure including the risks, benefits and alternatives for the proposed anesthesia with the patient or authorized representative who has indicated his/her understanding and acceptance.   Dental advisory given  Plan Discussed with: CRNA and Surgeon  Anesthesia Plan Comments: (Plan routine monitors, MAC)        Anesthesia Quick Evaluation

## 2016-03-06 NOTE — Discharge Instructions (Signed)
Resume all home medications  Keep dressing on until Saturday  May shower Monday

## 2016-03-07 ENCOUNTER — Telehealth: Payer: Self-pay | Admitting: *Deleted

## 2016-03-07 ENCOUNTER — Encounter (HOSPITAL_COMMUNITY): Payer: Self-pay | Admitting: Cardiothoracic Surgery

## 2016-03-07 NOTE — Telephone Encounter (Signed)
Call from [place] on [address].  Pt is requesting refill on Metoprolol from Dr. Julien Nordmann.

## 2016-03-07 NOTE — Telephone Encounter (Signed)
Returned call to pt, instructed pt PCP will need to fill lopressor, MD out of office. Pt advised he does not have a PCP. Gave pt Phone number and address to [facility] advised to call today to get an appt, possible walk in clinic to establish PCP and address medication needs.  Pt verbalized understanding and confirmed he would call after this call. No further concerns.

## 2016-03-07 NOTE — Op Note (Signed)
NAME:  Roger Barnes, TIBBITTS NO.:  [id number]  MEDICAL RECORD NO.:  [id number]  LOCATION:  [facility]                         FACILITY:  [facility]  PHYSICIAN:  Ivin Poot, M.D.  DATE OF BIRTH:  02-26-1961  DATE OF PROCEDURE:  03/06/2016 DATE OF DISCHARGE:  03/06/2016                              OPERATIVE REPORT   OPERATION:  Placement of right Port-A-Cath catheter.  SURGEON:  Ivin Poot, M.D.  ANESTHESIA:  MAC.  PREOPERATIVE DIAGNOSIS:  Advanced stage non-small cell carcinoma of the right lung for chemotherapy.  POSTOPERATIVE DIAGNOSIS:  Advanced stage non-small cell carcinoma of the right lung for chemotherapy.  DESCRIPTION OF PROCEDURE:  After the patient had been counseled and informed consent obtained for right PleurX catheter placement as recommended by his oncologist, the patient presented to the operating room for surgery.  After informed consent was obtained and the proper site marked, the patient was brought to the operating room and placed supine on the operating table.  Anesthesia delivered IV conscious monitored sedation.  This was supplemented by IV lidocaine.  The patient's chest was prepped and draped as a sterile field.  A proper time-out was performed.  A 1% lidocaine was infiltrated in the midclavicular line at the 4th interspace for the Port-A-Cath pocket and also infiltrated in the lateral aspect of the right clavicle for cannulation of the subclavian vein.  A small incision was made beneath the right clavicle and using the Seldinger technique, a guidewire was passed through the subclavian vein into the right atrium and confirmed by C-arm fluoroscopy. i Next, a small incision was made in the area of anesthetized skin in the midclavicular line and the pocket for the Port-A-Cath was created. Next, the Port-A-Cath was tunneled from the lower incision to the upper incision.  Next, over the guidewire, a dilator sheath system was inserted into  the superior vena cava and the guidewire was removed.  The dilator was removed and through the tear-away sheath, the Port-A-Cath was inserted after it had been cut to a length of 28 cm.  The catheter was then flushed and aspirated blood freely.  The upper incision was then closed with two interrupted nylon skin sutures.  Heparin 2 mL was injected into the Port-A-Cath reservoir.  The lower incision was then closed with interrupted 3-0 Vicryl for the subcutaneous layer and a running subcuticular skin stitch.  Benzoin and Steri-Strips were placed on the lower incision and sterile dressings were applied.  Prior to terminating the procedure, C-arm fluoroscopy confirmed the Port-A- Cath to be in good position.  The patient returned to the recovery room in stable condition.     Ivin Poot, M.D.     PV/MEDQ  D:  03/06/2016  T:  03/07/2016  Job:  [id number]

## 2016-03-10 ENCOUNTER — Ambulatory Visit: Payer: Medicare HMO | Attending: Internal Medicine | Admitting: Physician Assistant

## 2016-03-10 VITALS — BP 120/79 | HR 95 | Temp 98.7°F | Resp 18 | Ht 70.0 in | Wt 157.4 lb

## 2016-03-10 DIAGNOSIS — R000 Tachycardia, unspecified: Secondary | ICD-10-CM

## 2016-03-10 DIAGNOSIS — C3490 Malignant neoplasm of unspecified part of unspecified bronchus or lung: Secondary | ICD-10-CM | POA: Insufficient documentation

## 2016-03-10 DIAGNOSIS — C3491 Malignant neoplasm of unspecified part of right bronchus or lung: Secondary | ICD-10-CM

## 2016-03-10 DIAGNOSIS — R52 Pain, unspecified: Secondary | ICD-10-CM | POA: Insufficient documentation

## 2016-03-10 DIAGNOSIS — K5900 Constipation, unspecified: Secondary | ICD-10-CM | POA: Insufficient documentation

## 2016-03-10 MED ORDER — METOPROLOL TARTRATE 25 MG PO TABS: 25.0000 mg | ORAL_TABLET | Freq: Two times a day (BID) | ORAL | 12 refills | Status: AC

## 2016-03-10 MED ORDER — LEVALBUTEROL HCL 0.63 MG/3ML IN NEBU: 0.6300 mg | INHALATION_SOLUTION | Freq: Two times a day (BID) | RESPIRATORY_TRACT | 12 refills | Status: AC

## 2016-03-10 MED ORDER — HYDROCODONE-ACETAMINOPHEN 5-325 MG PO TABS: 1.0000 | ORAL_TABLET | ORAL | 0 refills | Status: DC | PRN

## 2016-03-10 MED ORDER — TRAMADOL HCL 50 MG PO TABS: 50.0000 mg | ORAL_TABLET | Freq: Four times a day (QID) | ORAL | 1 refills | Status: DC | PRN

## 2016-03-10 NOTE — Progress Notes (Signed)
Patient is here for HFU  Patient request refill on Metoprolol.  Patient has taken medication today and patient has eaten today.

## 2016-03-10 NOTE — Progress Notes (Signed)
Roger Barnes  EPP:[id number]  SAY:[id number]  DOB - 04-05-1961  Chief Complaint  Patient presents with  . Hospitalization Follow-up       Subjective:   Roger Barnes is a 55 y.o. male here today for establishment of care. He presented to the emergency department on 01/25/2016 with increasing shortness of breath. He also had been experiencing some right-sided chest pain and weight loss. A chest x-ray revealed a large right sided pleural effusion with opacification of the right lung. He is status post thoracentesis on 3 occasions. Cytology was consistent with malignancy. Oncology has been involved. The CT surgery team did VATS on 02/01/16 with pleurodesis and pleura catheter placement.  His hospital course was complicated by a pneumothorax that was managed by the CT surgery team. He had AKI resolved with hydration,  Tachycardia resolved with hydration and BB and acute blood loss anemia.  Since discharge e has followed up multiple times with oncology. He is now at the point where he has decidedupon palliative chemotherapy and his first treatment was last week. He has an upcoming appointment later this week with Oncology.   He is here today in need of refills and for establishment of care. Only complaint is constipation.    ROS: GEN: denies fever or chills, denies change in weight Skin: denies lesions or rashes HEENT: denies headache, earache, epistaxis, sore throat, or neck pain LUNGS: +SHOB, dyspnea, PND, orthopnea CV: denies CP or palpitations ABD: denies abd pain, N or V EXT: denies muscle spasms or swelling; no pain in lower ext, no weakness NEURO: denies numbness or tingling, denies sz, stroke or TIA  ALLERGIES: Allergies  Allergen Reactions  . No Known Allergies     PAST MEDICAL HISTORY: Past Medical History:  Diagnosis Date  . Adenocarcinoma of right lung, stage 4 ([place])   . Encounter for antineoplastic chemotherapy 02/26/2016  . Pleural effusion, right 01/25/2016    . Tachycardia     PAST SURGICAL HISTORY: Past Surgical History:  Procedure Laterality Date  . CHEST TUBE INSERTION Right 02/01/2016   Procedure: INSERTION PLEURAL DRAINAGE CATHETER;  Surgeon: Ivin Poot, MD;  Location: [place];  Service: Thoracic;  Laterality: Right;  . PLEURADESIS Right 02/01/2016   Procedure: DOXYCYCLINE PLEURADESIS;  Surgeon: Ivin Poot, MD;  Location: [place];  Service: Thoracic;  Laterality: Right;  . PORTACATH PLACEMENT Right 03/06/2016   Procedure: INSERTION PORT-A-CATH;  Surgeon: Ivin Poot, MD;  Location: [place];  Service: Thoracic;  Laterality: Right;  . THORACENTESIS Right 01/25/2016  . VIDEO ASSISTED THORACOSCOPY Right 02/01/2016   Procedure: VIDEO ASSISTED THORACOSCOPY;  Surgeon: Ivin Poot, MD;  Location: [place];  Service: Thoracic;  Laterality: Right;  Marland Kitchen VIDEO BRONCHOSCOPY N/A 02/01/2016   Procedure: VIDEO BRONCHOSCOPY;  Surgeon: Ivin Poot, MD;  Location: [facility] OR;  Service: Thoracic;  Laterality: N/A;  . WISDOM TOOTH EXTRACTION      MEDICATIONS AT HOME: Prior to Admission medications   Medication Sig Start Date End Date Taking? Authorizing Provider  acetaminophen (TYLENOL) 325 MG tablet Take 2 tablets (650 mg total) by mouth every 6 (six) hours as needed for mild pain (or Fever >/= 101). 02/05/16  Yes Belkys A Regalado, MD  bisacodyl (DULCOLAX) 5 MG EC tablet Take 2 tablets (10 mg total) by mouth daily. 02/05/16  Yes Belkys A Regalado, MD  dexamethasone (DECADRON) 4 MG tablet 4 mg by mouth twice a day the day before, day of and day after the chemotherapy every 3 weeks 02/26/16  Yes Curt Bears, MD  feeding supplement, ENSURE ENLIVE, (ENSURE ENLIVE) LIQD Take 237 mLs by mouth 2 (two) times daily between meals. 02/05/16  Yes Belkys A Regalado, MD  folic acid (FOLVITE) 1 MG tablet Take 1 tablet (1 mg total) by mouth daily. 02/26/16  Yes Curt Bears, MD  HYDROcodone-acetaminophen (NORCO/VICODIN) 5-325 MG tablet Take 1-2 tablets by mouth every 4  (four) hours as needed for moderate pain. 03/10/16  Yes Tymber Stallings Daneil Dan, PA-C  levalbuterol (XOPENEX) 0.63 MG/3ML nebulizer solution Take 3 mLs (0.63 mg total) by nebulization 2 (two) times daily. 03/10/16  Yes Mack Alvidrez Daneil Dan, PA-C  lidocaine-prilocaine (EMLA) cream Apply 1 application topically as needed. 02/26/16  Yes Curt Bears, MD  metoprolol tartrate (LOPRESSOR) 25 MG tablet Take 1 tablet (25 mg total) by mouth 2 (two) times daily. 03/10/16  Yes Aadarsh Cozort Daneil Dan, PA-C  Multiple Vitamins-Minerals (MULTIVITAMIN WITH MINERALS) tablet Take 1 tablet by mouth daily.   Yes Historical Provider, MD  polyethylene glycol (MIRALAX / GLYCOLAX) packet Take 17 g by mouth daily as needed for mild constipation. 02/05/16  Yes Belkys A Regalado, MD  traMADol (ULTRAM) 50 MG tablet Take 1 tablet (50 mg total) by mouth every 6 (six) hours as needed (mild pain). 03/10/16  Yes Mikiah Demond Daneil Dan, PA-C  prochlorperazine (COMPAZINE) 10 MG tablet Take 1 tablet (10 mg total) by mouth every 6 (six) hours as needed for nausea or vomiting. Patient not taking: Reported on 03/10/2016 02/26/16   Curt Bears, MD     Objective:   Vitals:   03/10/16 0927  BP: 120/79  Pulse: 95  Resp: 18  Temp: 98.7 F (37.1 C)  TempSrc: Oral  SpO2: 96%  Weight: 157 lb 6.4 oz (71.4 kg)  Height: '5\' 10"'$  (1.778 m)    Exam General appearance : Awake, alert, not in any distress. Speech Clear. Not toxic looking HEENT: Atraumatic and Normocephalic, pupils equally reactive to light and accomodation Neck: supple, no JVD. No cervical lymphadenopathy.  Chest:decreased breath sounds on the right, clear on left CVS: S1 S2 regular, no murmurs.  Abdomen: Bowel sounds present, Non tender and not distended with no gaurding, rigidity or rebound. Extremities: B/L Lower Ext shows no edema, both legs are warm to touch Neurology: Awake alert, and oriented X 3, CN II-XII intact, Non focal Skin:No Rash Wounds:C and D   Assessment & Plan  1. Stage 4 non  small cell lung CA (adenocarcinoma)  -Keep appt later this week with Onc  -Palliative chemo underway 2. Constipation  -OTC relief  -increase water and fiber 3. Pain 2/2 # 1  -refilled Tramadol and Percocet  Has multi appts coming up and encouraged compliance Return in about 1 month (around 04/10/2016).  The patient was given clear instructions to go to ER or return to medical center if symptoms don't improve, worsen or new problems develop. The patient verbalized understanding. The patient was told to call to get lab results if they haven't heard anything in the next week.   This note has been created with Surveyor, quantity. Any transcriptional errors are unintentional.    Zettie Pho, PA-C [facility] [place], [place]   03/10/2016, 9:34 AM

## 2016-03-11 ENCOUNTER — Other Ambulatory Visit: Payer: Medicare HMO

## 2016-03-12 ENCOUNTER — Other Ambulatory Visit: Payer: Medicare HMO

## 2016-03-13 ENCOUNTER — Other Ambulatory Visit (HOSPITAL_BASED_OUTPATIENT_CLINIC_OR_DEPARTMENT_OTHER): Payer: Medicare HMO

## 2016-03-13 ENCOUNTER — Telehealth: Payer: Self-pay | Admitting: Internal Medicine

## 2016-03-13 ENCOUNTER — Ambulatory Visit (HOSPITAL_BASED_OUTPATIENT_CLINIC_OR_DEPARTMENT_OTHER): Payer: Medicare HMO | Admitting: Nurse Practitioner

## 2016-03-13 VITALS — BP 115/66 | HR 101 | Temp 98.5°F | Resp 18 | Ht 70.0 in | Wt 156.8 lb

## 2016-03-13 DIAGNOSIS — J918 Pleural effusion in other conditions classified elsewhere: Secondary | ICD-10-CM | POA: Diagnosis not present

## 2016-03-13 DIAGNOSIS — C3431 Malignant neoplasm of lower lobe, right bronchus or lung: Secondary | ICD-10-CM

## 2016-03-13 DIAGNOSIS — J90 Pleural effusion, not elsewhere classified: Secondary | ICD-10-CM

## 2016-03-13 DIAGNOSIS — C3491 Malignant neoplasm of unspecified part of right bronchus or lung: Secondary | ICD-10-CM

## 2016-03-13 LAB — COMPREHENSIVE METABOLIC PANEL
ALBUMIN: 2.8 g/dL — AB (ref 3.5–5.0)
ALK PHOS: 95 U/L (ref 40–150)
ALT: 21 U/L (ref 0–55)
AST: 26 U/L (ref 5–34)
Anion Gap: 9 mEq/L (ref 3–11)
BUN: 16.9 mg/dL (ref 7.0–26.0)
CO2: 29 meq/L (ref 22–29)
Calcium: 8.8 mg/dL (ref 8.4–10.4)
Chloride: 101 mEq/L (ref 98–109)
Creatinine: 1.1 mg/dL (ref 0.7–1.3)
EGFR: 75 mL/min/{1.73_m2} — AB (ref >90)
GLUCOSE: 116 mg/dL (ref 70–140)
POTASSIUM: 3.6 meq/L (ref 3.5–5.1)
SODIUM: 139 meq/L (ref 136–145)
TOTAL PROTEIN: 6.8 g/dL (ref 6.4–8.3)
Total Bilirubin: 0.68 mg/dL (ref 0.20–1.20)

## 2016-03-13 LAB — CBC WITH DIFFERENTIAL/PLATELET
BASO%: 0.2 % (ref 0.0–2.0)
Basophils Absolute: 0 10*3/uL (ref 0.0–0.1)
EOS%: 1.1 % (ref 0.0–7.0)
Eosinophils Absolute: 0.1 10*3/uL (ref 0.0–0.5)
HCT: 33.3 % — ABNORMAL LOW (ref 38.4–49.9)
HEMOGLOBIN: 10.9 g/dL — AB (ref 13.0–17.1)
LYMPH%: 11.7 % — AB (ref 14.0–49.0)
MCH: 28.8 pg (ref 27.2–33.4)
MCHC: 32.7 g/dL (ref 32.0–36.0)
MCV: 87.9 fL (ref 79.3–98.0)
MONO#: 0.2 10*3/uL (ref 0.1–0.9)
MONO%: 3.9 % (ref 0.0–14.0)
NEUT%: 83.1 % — ABNORMAL HIGH (ref 39.0–75.0)
NEUTROS ABS: 3.6 10*3/uL (ref 1.5–6.5)
Platelets: 178 10*3/uL (ref 140–400)
RBC: 3.79 10*6/uL — AB (ref 4.20–5.82)
RDW: 13.9 % (ref 11.0–14.6)
WBC: 4.4 10*3/uL (ref 4.0–10.3)
lymph#: 0.5 10*3/uL — ABNORMAL LOW (ref 0.9–3.3)

## 2016-03-13 NOTE — Progress Notes (Signed)
  Roger Barnes OFFICE PROGRESS NOTE   Diagnosis:  Stage IV (T3, N0, M1 a) non-small cell lung cancer, adenocarcinoma, diagnosed August 2017; presented with large right lower lobe lung mass, right large pleural effusion with collapse of the right lung and shift of mediastinum and heart to the left in addition to left lung pulmonary nodules.  FOUNDATION ONE: Molecular study:  Genomic Alteration Identified? KRAS G12C Additional Findings? Microsatellite status Cannot Be Determined Tumor Mutation Burden Cannot Be Determined Additional Disease-relevant Genes with No Reportable Alterations Identified? EGFR ALK BRAF MET RET ERBB2 ROS1  PRIOR THERAPY: None.  CURRENT THERAPY: Systemic chemotherapy with carboplatin for AUC of 5, Alimta 500 MG/M2 and Avastin 15 MG/KG every 3 weeks. First dose 03/05/2016.   INTERVAL HISTORY:   Roger Barnes returns as scheduled. He completed cycle 1 carboplatin/Alimta 03/05/2016. He denies nausea/vomiting. He has had mild constipation. He is taking a laxative with good results. Mouth feels "tender". He has noted some "bumps" on his scalp similar to acne. He denies bleeding. No chest pain or shortness of breath. No leg swelling or calf pain.  Objective:  Vital signs in last 24 hours:  Blood pressure 115/66, pulse (!) 101, temperature 98.5 F (36.9 C), temperature source Oral, resp. rate 18, height _0  (1.778 m), weight 156 lb 12.8 oz (71.1 kg), SpO2 98 %.    HEENT: No thrush or ulcers. Mucous membranes appear moist. Resp: Breath sounds diminished right lung field. No respiratory distress. Right Pleurx catheter. Cardio: Regular rate and rhythm. GI: Abdomen soft and nontender. No hepatomegaly. Vascular: No leg edema. Skin: A few tiny scabbed lesions anterior parietal scalp. Port-A-Cath without erythema.  Lab Results:  Lab Results  Component Value Date   WBC 4.4 03/13/2016   HGB 10.9 (L) 03/13/2016   HCT 33.3 (L) 03/13/2016   MCV  87.9 03/13/2016   PLT 178 03/13/2016   NEUTROABS 3.6 03/13/2016    Imaging:  No results found.  Medications: I have reviewed the patient's current medications.  Assessment/Plan: 1. Stage IV non-small cell lung cancer, adenocarcinoma with negative EGFR, ALK, ROS 1 and BRAF diagnosed in August 2017 presenting with large right lower lobe lung mass in addition to large right pleural effusion and right upper lobe as well as left lung pulmonary nodules. Status post cycle 1 carboplatin/Alimta. 2. Port-A-Cath placement 03/06/2016.   Disposition: Roger Barnes appears stable. He has completed 1 cycle of carboplatin/Alimta. Overall he seems to tolerated the chemotherapy well. He is scheduled to return in one week for labs. He would like to have the labs done through his PCPs office. I sent a message through the computer to see if this would be possible. For now we will keep his schedule as is. He will return for a follow-up visit and cycle 2 carboplatin/Alimta plus Avastin in 2 weeks. He will contact the office in the interim with any problems.    Ned Card ANP/GNP-BC   03/13/2016  3:10 PM

## 2016-03-13 NOTE — Telephone Encounter (Signed)
No 10/12 los/orders/referrals °

## 2016-03-14 ENCOUNTER — Encounter: Payer: Self-pay | Admitting: *Deleted

## 2016-03-19 ENCOUNTER — Other Ambulatory Visit (HOSPITAL_BASED_OUTPATIENT_CLINIC_OR_DEPARTMENT_OTHER): Payer: Medicare HMO

## 2016-03-19 DIAGNOSIS — C3431 Malignant neoplasm of lower lobe, right bronchus or lung: Secondary | ICD-10-CM

## 2016-03-19 DIAGNOSIS — C3491 Malignant neoplasm of unspecified part of right bronchus or lung: Secondary | ICD-10-CM

## 2016-03-19 DIAGNOSIS — J90 Pleural effusion, not elsewhere classified: Secondary | ICD-10-CM

## 2016-03-19 LAB — CBC WITH DIFFERENTIAL/PLATELET
BASO%: 0 % (ref 0.0–2.0)
Basophils Absolute: 0 10*3/uL (ref 0.0–0.1)
EOS ABS: 0 10*3/uL (ref 0.0–0.5)
EOS%: 1.8 % (ref 0.0–7.0)
HCT: 30.8 % — ABNORMAL LOW (ref 38.4–49.9)
HEMOGLOBIN: 10.2 g/dL — AB (ref 13.0–17.1)
LYMPH%: 28.2 % (ref 14.0–49.0)
MCH: 28.7 pg (ref 27.2–33.4)
MCHC: 33.1 g/dL (ref 32.0–36.0)
MCV: 86.8 fL (ref 79.3–98.0)
MONO#: 0.2 10*3/uL (ref 0.1–0.9)
MONO%: 9.1 % (ref 0.0–14.0)
NEUT%: 60.9 % (ref 39.0–75.0)
NEUTROS ABS: 1.3 10*3/uL — AB (ref 1.5–6.5)
PLATELETS: 44 10*3/uL — AB (ref 140–400)
RBC: 3.55 10*6/uL — AB (ref 4.20–5.82)
RDW: 13.7 % (ref 11.0–14.6)
WBC: 2.2 10*3/uL — AB (ref 4.0–10.3)
lymph#: 0.6 10*3/uL — ABNORMAL LOW (ref 0.9–3.3)

## 2016-03-19 LAB — COMPREHENSIVE METABOLIC PANEL
ALBUMIN: 2.7 g/dL — AB (ref 3.5–5.0)
ALK PHOS: 96 U/L (ref 40–150)
ALT: 13 U/L (ref 0–55)
ANION GAP: 9 meq/L (ref 3–11)
AST: 22 U/L (ref 5–34)
BILIRUBIN TOTAL: 0.37 mg/dL (ref 0.20–1.20)
BUN: 10.1 mg/dL (ref 7.0–26.0)
CALCIUM: 8.8 mg/dL (ref 8.4–10.4)
CO2: 28 mEq/L (ref 22–29)
Chloride: 104 mEq/L (ref 98–109)
Creatinine: 1 mg/dL (ref 0.7–1.3)
EGFR: 87 mL/min/{1.73_m2} — AB (ref >90)
Glucose: 107 mg/dl (ref 70–140)
Potassium: 3.4 mEq/L — ABNORMAL LOW (ref 3.5–5.1)
Sodium: 141 mEq/L (ref 136–145)
TOTAL PROTEIN: 6.7 g/dL (ref 6.4–8.3)

## 2016-03-26 ENCOUNTER — Ambulatory Visit: Payer: Medicare HMO

## 2016-03-27 ENCOUNTER — Other Ambulatory Visit (HOSPITAL_BASED_OUTPATIENT_CLINIC_OR_DEPARTMENT_OTHER): Payer: Medicare HMO

## 2016-03-27 ENCOUNTER — Ambulatory Visit (HOSPITAL_BASED_OUTPATIENT_CLINIC_OR_DEPARTMENT_OTHER): Payer: Medicare HMO | Admitting: Internal Medicine

## 2016-03-27 ENCOUNTER — Encounter: Payer: Self-pay | Admitting: Internal Medicine

## 2016-03-27 ENCOUNTER — Telehealth: Payer: Self-pay | Admitting: Internal Medicine

## 2016-03-27 ENCOUNTER — Ambulatory Visit (HOSPITAL_BASED_OUTPATIENT_CLINIC_OR_DEPARTMENT_OTHER): Payer: Medicare HMO

## 2016-03-27 VITALS — BP 118/73 | HR 84 | Temp 97.3°F | Resp 17 | Ht 70.0 in | Wt 156.3 lb

## 2016-03-27 DIAGNOSIS — C3431 Malignant neoplasm of lower lobe, right bronchus or lung: Secondary | ICD-10-CM

## 2016-03-27 DIAGNOSIS — C3491 Malignant neoplasm of unspecified part of right bronchus or lung: Secondary | ICD-10-CM

## 2016-03-27 DIAGNOSIS — Z5111 Encounter for antineoplastic chemotherapy: Secondary | ICD-10-CM | POA: Diagnosis not present

## 2016-03-27 DIAGNOSIS — Z5112 Encounter for antineoplastic immunotherapy: Secondary | ICD-10-CM

## 2016-03-27 DIAGNOSIS — J910 Malignant pleural effusion: Secondary | ICD-10-CM

## 2016-03-27 DIAGNOSIS — J90 Pleural effusion, not elsewhere classified: Secondary | ICD-10-CM

## 2016-03-27 LAB — CBC WITH DIFFERENTIAL/PLATELET
BASO%: 0 % (ref 0.0–2.0)
Basophils Absolute: 0 10*3/uL (ref 0.0–0.1)
EOS%: 0 % (ref 0.0–7.0)
Eosinophils Absolute: 0 10*3/uL (ref 0.0–0.5)
HCT: 33.8 % — ABNORMAL LOW (ref 38.4–49.9)
HGB: 11 g/dL — ABNORMAL LOW (ref 13.0–17.1)
LYMPH%: 10.7 % — AB (ref 14.0–49.0)
MCH: 28.8 pg (ref 27.2–33.4)
MCHC: 32.5 g/dL (ref 32.0–36.0)
MCV: 88.5 fL (ref 79.3–98.0)
MONO#: 0.3 10*3/uL (ref 0.1–0.9)
MONO%: 9.2 % (ref 0.0–14.0)
NEUT%: 80.1 % — AB (ref 39.0–75.0)
NEUTROS ABS: 2.6 10*3/uL (ref 1.5–6.5)
PLATELETS: 459 10*3/uL — AB (ref 140–400)
RBC: 3.82 10*6/uL — AB (ref 4.20–5.82)
RDW: 15.3 % — ABNORMAL HIGH (ref 11.0–14.6)
WBC: 3.3 10*3/uL — AB (ref 4.0–10.3)
lymph#: 0.4 10*3/uL — ABNORMAL LOW (ref 0.9–3.3)

## 2016-03-27 LAB — COMPREHENSIVE METABOLIC PANEL
ALT: 13 U/L (ref 0–55)
ANION GAP: 8 meq/L (ref 3–11)
AST: 19 U/L (ref 5–34)
Albumin: 2.9 g/dL — ABNORMAL LOW (ref 3.5–5.0)
Alkaline Phosphatase: 99 U/L (ref 40–150)
BUN: 17.7 mg/dL (ref 7.0–26.0)
CHLORIDE: 107 meq/L (ref 98–109)
CO2: 27 meq/L (ref 22–29)
Calcium: 9.1 mg/dL (ref 8.4–10.4)
Creatinine: 1 mg/dL (ref 0.7–1.3)
EGFR: 86 mL/min/{1.73_m2} — AB (ref >90)
GLUCOSE: 168 mg/dL — AB (ref 70–140)
Potassium: 4.4 mEq/L (ref 3.5–5.1)
SODIUM: 142 meq/L (ref 136–145)
TOTAL PROTEIN: 7.2 g/dL (ref 6.4–8.3)

## 2016-03-27 LAB — UA PROTEIN, DIPSTICK - CHCC: Protein, ur: <30 mg/dL

## 2016-03-27 MED ORDER — SODIUM CHLORIDE 0.9 % IV SOLN
550.0000 mg | Freq: Once | INTRAVENOUS | Status: AC
  Administered 2016-03-27: 550 mg via INTRAVENOUS
  Filled 2016-03-27: qty 55

## 2016-03-27 MED ORDER — SODIUM CHLORIDE 0.9 % IV SOLN
15.0000 mg/kg | Freq: Once | INTRAVENOUS | Status: AC
  Administered 2016-03-27: 1100 mg via INTRAVENOUS
  Filled 2016-03-27: qty 32

## 2016-03-27 MED ORDER — PALONOSETRON HCL INJECTION 0.25 MG/5ML
0.2500 mg | Freq: Once | INTRAVENOUS | Status: AC
  Administered 2016-03-27: 0.25 mg via INTRAVENOUS

## 2016-03-27 MED ORDER — DEXAMETHASONE SODIUM PHOSPHATE 10 MG/ML IJ SOLN
10.0000 mg | Freq: Once | INTRAMUSCULAR | Status: AC
  Administered 2016-03-27: 10 mg via INTRAVENOUS

## 2016-03-27 MED ORDER — DEXAMETHASONE SODIUM PHOSPHATE 10 MG/ML IJ SOLN
INTRAMUSCULAR | Status: AC
  Filled 2016-03-27: qty 1

## 2016-03-27 MED ORDER — SODIUM CHLORIDE 0.9 % IV SOLN
Freq: Once | INTRAVENOUS | Status: AC
  Administered 2016-03-27: 11:00:00 via INTRAVENOUS

## 2016-03-27 MED ORDER — SODIUM CHLORIDE 0.9 % IV SOLN
530.0000 mg/m2 | Freq: Once | INTRAVENOUS | Status: AC
  Administered 2016-03-27: 1000 mg via INTRAVENOUS
  Filled 2016-03-27: qty 40

## 2016-03-27 MED ORDER — SODIUM CHLORIDE 0.9% FLUSH
10.0000 mL | INTRAVENOUS | Status: DC | PRN
Start: 2016-03-27 — End: 2016-03-27
  Administered 2016-03-27: 10 mL
  Filled 2016-03-27: qty 10

## 2016-03-27 MED ORDER — PALONOSETRON HCL INJECTION 0.25 MG/5ML
INTRAVENOUS | Status: AC
  Filled 2016-03-27: qty 5

## 2016-03-27 MED ORDER — HEPARIN SOD (PORK) LOCK FLUSH 100 UNIT/ML IV SOLN
500.0000 [IU] | Freq: Once | INTRAVENOUS | Status: AC | PRN
Start: 2016-03-27 — End: 2016-03-27
  Administered 2016-03-27: 500 [IU]
  Filled 2016-03-27: qty 5

## 2016-03-27 NOTE — Patient Instructions (Signed)
Dexamethasone injection What is this medicine? DEXAMETHASONE (dex a METH a sone) is a corticosteroid. It is used to treat inflammation of the skin, joints, lungs, and other organs. Common conditions treated include asthma, allergies, and arthritis. It is also used for other conditions, like blood disorders and diseases of the adrenal glands. This medicine may be used for other purposes; ask your health care provider or pharmacist if you have questions. What should I tell my health care provider before I take this medicine? They need to know if you have any of these conditions: -blood clotting problems -Cushing's syndrome -diabetes -glaucoma -heart problems or disease -high blood pressure -infection like herpes, measles, tuberculosis, or chickenpox -kidney disease -liver disease -mental problems -myasthenia gravis -osteoporosis -previous heart attack -seizures -stomach, ulcer or intestine disease including colitis and diverticulitis -thyroid problem -an unusual or allergic reaction to dexamethasone, corticosteroids, other medicines, lactose, foods, dyes, or preservatives -pregnant or trying to get pregnant -breast-feeding How should I use this medicine? This medicine is for injection into a muscle, joint, lesion, soft tissue, or vein. It is given by a health care professional in a hospital or clinic setting. Talk to your pediatrician regarding the use of this medicine in children. Special care may be needed. Overdosage: If you think you have taken too much of this medicine contact a poison control center or emergency room at once. NOTE: This medicine is only for you. Do not share this medicine with others. What if I miss a dose? This may not apply. If you are having a series of injections over a prolonged period, try not to miss an appointment. Call your doctor or health care professional to reschedule if you are unable to keep an appointment. What may interact with this medicine? Do  not take this medicine with any of the following medications: -mifepristone, RU-486 -vaccines This medicine may also interact with the following medications: -amphotericin B -antibiotics like clarithromycin, erythromycin, and troleandomycin -aspirin and aspirin-like drugs -barbiturates like phenobarbital -carbamazepine -cholestyramine -cholinesterase inhibitors like donepezil, galantamine, rivastigmine, and tacrine -cyclosporine -digoxin -diuretics -ephedrine -male hormones, like estrogens or progestins and birth control pills -indinavir -isoniazid -ketoconazole -medicines for diabetes -medicines that improve muscle tone or strength for conditions like myasthenia gravis -NSAIDs, medicines for pain and inflammation, like ibuprofen or naproxen -phenytoin -rifampin -thalidomide -warfarin This list may not describe all possible interactions. Give your health care provider a list of all the medicines, herbs, non-prescription drugs, or dietary supplements you use. Also tell them if you smoke, drink alcohol, or use illegal drugs. Some items may interact with your medicine. What should I watch for while using this medicine? Your condition will be monitored carefully while you are receiving this medicine. If you are taking this medicine for a long time, carry an identification card with your name and address, the type and dose of your medicine, and your doctor's name and address. This medicine may increase your risk of getting an infection. Stay away from people who are sick. Tell your doctor or health care professional if you are around anyone with measles or chickenpox. Talk to your health care provider before you get any vaccines that you take this medicine. If you are going to have surgery, tell your doctor or health care professional that you have taken this medicine within the last twelve months. Ask your doctor or health care professional about your diet. You may need to lower the  amount of salt you eat. The medicine can increase your blood sugar. If you  are a diabetic check with your doctor if you need help adjusting the dose of your diabetic medicine. What side effects may I notice from receiving this medicine? Side effects that you should report to your doctor or health care professional as soon as possible: -allergic reactions like skin rash, itching or hives, swelling of the face, lips, or tongue -black or tarry stools -change in the amount of urine -changes in vision -confusion, excitement, restlessness, a false sense of well-being -fever, sore throat, sneezing, cough, or other signs of infection, wounds that will not heal -hallucinations -increased thirst -mental depression, mood swings, mistaken feelings of self importance or of being mistreated -pain in hips, back, ribs, arms, shoulders, or legs -pain, redness, or irritation at the injection site -redness, blistering, peeling or loosening of the skin, including inside the mouth -rounding out of face -swelling of feet or lower legs -unusual bleeding or bruising -unusual tired or weak -wounds that do not heal Side effects that usually do not require medical attention (report to your doctor or health care professional if they continue or are bothersome): -diarrhea or constipation -change in taste -headache -nausea, vomiting -skin problems, acne, thin and shiny skin -touble sleeping -unusual growth of hair on the face or body -weight gain This list may not describe all possible side effects. Call your doctor for medical advice about side effects. You may report side effects to FDA at 1-800-FDA-1088. Where should I keep my medicine? This drug is given in a hospital or clinic and will not be stored at home. NOTE: This sheet is a summary. It may not cover all possible information. If you have questions about this medicine, talk to your doctor, pharmacist, or health care provider.    2016, [employer]  Standard. (2007-09-09 14:04:12) Palonosetron Injection What is this medicine? PALONOSETRON (pal oh NOE se tron) is used to prevent nausea and vomiting caused by chemotherapy. It also helps prevent delayed nausea and vomiting that may occur a few days after your treatment. This medicine may be used for other purposes; ask your health care provider or pharmacist if you have questions. What should I tell my health care provider before I take this medicine? They need to know if you have any of these conditions: -an unusual or allergic reaction to palonosetron, dolasetron, granisetron, ondansetron, other medicines, foods, dyes, or preservatives -pregnant or trying to get pregnant -breast-feeding How should I use this medicine? This medicine is for infusion into a vein. It is given by a health care professional in a hospital or clinic setting. Talk to your pediatrician regarding the use of this medicine in children. While this drug may be prescribed for children as young as 1 month for selected conditions, precautions do apply. Overdosage: If you think you have taken too much of this medicine contact a poison control center or emergency room at once. NOTE: This medicine is only for you. Do not share this medicine with others. What if I miss a dose? This does not apply. What may interact with this medicine? -certain medicines for depression, anxiety, or psychotic disturbances -fentanyl -linezolid -MAOIs like Carbex, Eldepryl, Marplan, Nardil, and Parnate -methylene blue (injected into a vein) -tramadol This list may not describe all possible interactions. Give your health care provider a list of all the medicines, herbs, non-prescription drugs, or dietary supplements you use. Also tell them if you smoke, drink alcohol, or use illegal drugs. Some items may interact with your medicine. What should I watch for while using this medicine? Your  condition will be monitored carefully while you are  receiving this medicine. What side effects may I notice from receiving this medicine? Side effects that you should report to your doctor or health care professional as soon as possible: -allergic reactions like skin rash, itching or hives, swelling of the face, lips, or tongue -breathing problems -confusion -dizziness -fast, irregular heartbeat -fever and chills -loss of balance or coordination -seizures -sweating -swelling of the hands and feet -tremors -unusually weak or tired Side effects that usually do not require medical attention (report to your doctor or health care professional if they continue or are bothersome): -constipation or diarrhea -headache This list may not describe all possible side effects. Call your doctor for medical advice about side effects. You may report side effects to FDA at 1-800-FDA-1088. Where should I keep my medicine? This drug is given in a hospital or clinic and will not be stored at home. NOTE: This sheet is a summary. It may not cover all possible information. If you have questions about this medicine, talk to your doctor, pharmacist, or health care provider.    2016, [employer] Standard. (2013-03-25 10:38:36) Carboplatin injection What is this medicine? CARBOPLATIN (KAR boe pla tin) is a chemotherapy drug. It targets fast dividing cells, like cancer cells, and causes these cells to die. This medicine is used to treat ovarian cancer and many other cancers. This medicine may be used for other purposes; ask your health care provider or pharmacist if you have questions. What should I tell my health care provider before I take this medicine? They need to know if you have any of these conditions: -blood disorders -hearing problems -kidney disease -recent or ongoing radiation therapy -an unusual or allergic reaction to carboplatin, cisplatin, other chemotherapy, other medicines, foods, dyes, or preservatives -pregnant or trying to get  pregnant -breast-feeding How should I use this medicine? This drug is usually given as an infusion into a vein. It is administered in a hospital or clinic by a specially trained health care professional. Talk to your pediatrician regarding the use of this medicine in children. Special care may be needed. Overdosage: If you think you have taken too much of this medicine contact a poison control center or emergency room at once. NOTE: This medicine is only for you. Do not share this medicine with others. What if I miss a dose? It is important not to miss a dose. Call your doctor or health care professional if you are unable to keep an appointment. What may interact with this medicine? -medicines for seizures -medicines to increase blood counts like filgrastim, pegfilgrastim, sargramostim -some antibiotics like amikacin, gentamicin, neomycin, streptomycin, tobramycin -vaccines Talk to your doctor or health care professional before taking any of these medicines: -acetaminophen -aspirin -ibuprofen -ketoprofen -naproxen This list may not describe all possible interactions. Give your health care provider a list of all the medicines, herbs, non-prescription drugs, or dietary supplements you use. Also tell them if you smoke, drink alcohol, or use illegal drugs. Some items may interact with your medicine. What should I watch for while using this medicine? Your condition will be monitored carefully while you are receiving this medicine. You will need important blood work done while you are taking this medicine. This drug may make you feel generally unwell. This is not uncommon, as chemotherapy can affect healthy cells as well as cancer cells. Report any side effects. Continue your course of treatment even though you feel ill unless your doctor tells you to stop. In some  cases, you may be given additional medicines to help with side effects. Follow all directions for their use. Call your doctor or  health care professional for advice if you get a fever, chills or sore throat, or other symptoms of a cold or flu. Do not treat yourself. This drug decreases your body's ability to fight infections. Try to avoid being around people who are sick. This medicine may increase your risk to bruise or bleed. Call your doctor or health care professional if you notice any unusual bleeding. Be careful brushing and flossing your teeth or using a toothpick because you may get an infection or bleed more easily. If you have any dental work done, tell your dentist you are receiving this medicine. Avoid taking products that contain aspirin, acetaminophen, ibuprofen, naproxen, or ketoprofen unless instructed by your doctor. These medicines may hide a fever. Do not become pregnant while taking this medicine. Women should inform their doctor if they wish to become pregnant or think they might be pregnant. There is a potential for serious side effects to an unborn child. Talk to your health care professional or pharmacist for more information. Do not breast-feed an infant while taking this medicine. What side effects may I notice from receiving this medicine? Side effects that you should report to your doctor or health care professional as soon as possible: -allergic reactions like skin rash, itching or hives, swelling of the face, lips, or tongue -signs of infection - fever or chills, cough, sore throat, pain or difficulty passing urine -signs of decreased platelets or bleeding - bruising, pinpoint red spots on the skin, black, tarry stools, nosebleeds -signs of decreased red blood cells - unusually weak or tired, fainting spells, lightheadedness -breathing problems -changes in hearing -changes in vision -chest pain -high blood pressure -low blood counts - This drug may decrease the number of white blood cells, red blood cells and platelets. You may be at increased risk for infections and bleeding. -nausea and  vomiting -pain, swelling, redness or irritation at the injection site -pain, tingling, numbness in the hands or feet -problems with balance, talking, walking -trouble passing urine or change in the amount of urine Side effects that usually do not require medical attention (report to your doctor or health care professional if they continue or are bothersome): -hair loss -loss of appetite -metallic taste in the mouth or changes in taste This list may not describe all possible side effects. Call your doctor for medical advice about side effects. You may report side effects to FDA at 1-800-FDA-1088. Where should I keep my medicine? This drug is given in a hospital or clinic and will not be stored at home. NOTE: This sheet is a summary. It may not cover all possible information. If you have questions about this medicine, talk to your doctor, pharmacist, or health care provider.    2016, [employer] Standard. (2007-08-24 14:38:05) Bevacizumab injection What is this medicine? BEVACIZUMAB (be va SIZ yoo mab) is a monoclonal antibody. It is used to treat cervical cancer, colorectal cancer, glioblastoma multiforme, non-small cell lung cancer (NSCLC), ovarian cancer, and renal cell cancer. This medicine may be used for other purposes; ask your health care provider or pharmacist if you have questions. What should I tell my health care provider before I take this medicine? They need to know if you have any of these conditions: -blood clots -heart disease, including heart failure, heart attack, or chest pain (angina) -high blood pressure -infection (especially a virus infection such as chickenpox,  cold sores, or herpes) -kidney disease -lung disease -prior chemotherapy with doxorubicin, daunorubicin, epirubicin, or other anthracycline type chemotherapy agents -recent or ongoing radiation therapy -recent surgery -stroke -an unusual or allergic reaction to bevacizumab, hamster proteins, mouse  proteins, other medicines, foods, dyes, or preservatives -pregnant or trying to get pregnant -breast-feeding How should I use this medicine? This medicine is for infusion into a vein. It is given by a health care professional in a hospital or clinic setting. Talk to your pediatrician regarding the use of this medicine in children. Special care may be needed. Overdosage: If you think you have taken too much of this medicine contact a poison control center or emergency room at once. NOTE: This medicine is only for you. Do not share this medicine with others. What if I miss a dose? It is important not to miss your dose. Call your doctor or health care professional if you are unable to keep an appointment. What may interact with this medicine? Interactions are not expected. This list may not describe all possible interactions. Give your health care provider a list of all the medicines, herbs, non-prescription drugs, or dietary supplements you use. Also tell them if you smoke, drink alcohol, or use illegal drugs. Some items may interact with your medicine. What should I watch for while using this medicine? Your condition will be monitored carefully while you are receiving this medicine. You will need important blood work and urine testing done while you are taking this medicine. During your treatment, let your health care professional know if you have any unusual symptoms, such as difficulty breathing. This medicine may rarely cause 'gastrointestinal perforation' (holes in the stomach, intestines or colon), a serious side effect requiring surgery to repair. This medicine should be started at least 28 days following major surgery and the site of the surgery should be totally healed. Check with your doctor before scheduling dental work or surgery while you are receiving this treatment. Talk to your doctor if you have recently had surgery or if you have a wound that has not healed. Do not become pregnant  while taking this medicine or for 6 months after stopping it. Women should inform their doctor if they wish to become pregnant or think they might be pregnant. There is a potential for serious side effects to an unborn child. Talk to your health care professional or pharmacist for more information. Do not breast-feed an infant while taking this medicine. This medicine has caused ovarian failure in some women. This medicine may interfere with the ability to have a child. You should talk to your doctor or health care professional if you are concerned about your fertility. What side effects may I notice from receiving this medicine? Side effects that you should report to your doctor or health care professional as soon as possible: -allergic reactions like skin rash, itching or hives, swelling of the face, lips, or tongue -signs of infection - fever or chills, cough, sore throat, pain or trouble passing urine -signs of decreased platelets or bleeding - bruising, pinpoint red spots on the skin, black, tarry stools, nosebleeds, blood in the urine -breathing problems -changes in vision -chest pain -confusion -jaw pain, especially after dental work -mouth sores -seizures -severe abdominal pain -severe headache -sudden numbness or weakness of the face, arm or leg -swelling of legs or ankles -symptoms of a stroke: change in mental awareness, inability to talk or move one side of the body (especially in patients with lung cancer) -trouble passing urine  or change in the amount of urine -trouble speaking or understanding -trouble walking, dizziness, loss of balance or coordination Side effects that usually do not require medical attention (report to your doctor or health care professional if they continue or are bothersome): -constipation -diarrhea -dry skin -headache -loss of appetite -nausea, vomiting This list may not describe all possible side effects. Call your doctor for medical advice about  side effects. You may report side effects to FDA at 1-800-FDA-1088. Where should I keep my medicine? This drug is given in a hospital or clinic and will not be stored at home. NOTE: This sheet is a summary. It may not cover all possible information. If you have questions about this medicine, talk to your doctor, pharmacist, or health care provider.    2016, [employer]. (2014-07-18 16:58:44) Pemetrexed injection What is this medicine? PEMETREXED (PEM e TREX ed) is a chemotherapy drug. This medicine affects cells that are rapidly growing, such as cancer cells and cells in your mouth and stomach. It is usually used to treat lung cancers like non-small cell lung cancer and mesothelioma. It may also be used to treat other cancers. This medicine may be used for other purposes; ask your health care provider or pharmacist if you have questions. What should I tell my health care provider before I take this medicine? They need to know if you have any of these conditions: -if you frequently drink alcohol containing beverages -infection (especially a virus infection such as chickenpox, cold sores, or herpes) -kidney disease -liver disease -low blood counts, like low platelets, red bloods, or white blood cells -an unusual or allergic reaction to pemetrexed, mannitol, other medicines, foods, dyes, or preservatives -pregnant or trying to get pregnant -breast-feeding How should I use this medicine? This drug is given as an infusion into a vein. It is administered in a hospital or clinic by a specially trained health care professional. Talk to your pediatrician regarding the use of this medicine in children. Special care may be needed. Overdosage: If you think you have taken too much of this medicine contact a poison control center or emergency room at once. NOTE: This medicine is only for you. Do not share this medicine with others. What if I miss a dose? It is important not to miss your dose.  Call your doctor or health care professional if you are unable to keep an appointment. What may interact with this medicine? -aspirin and aspirin-like medicines -medicines to increase blood counts like filgrastim, pegfilgrastim, sargramostim -methotrexate -NSAIDS, medicines for pain and inflammation, like ibuprofen or naproxen -probenecid -pyrimethamine -vaccines Talk to your doctor or health care professional before taking any of these medicines: -acetaminophen -aspirin -ibuprofen -ketoprofen -naproxen This list may not describe all possible interactions. Give your health care provider a list of all the medicines, herbs, non-prescription drugs, or dietary supplements you use. Also tell them if you smoke, drink alcohol, or use illegal drugs. Some items may interact with your medicine. What should I watch for while using this medicine? Visit your doctor for checks on your progress. This drug may make you feel generally unwell. This is not uncommon, as chemotherapy can affect healthy cells as well as cancer cells. Report any side effects. Continue your course of treatment even though you feel ill unless your doctor tells you to stop. In some cases, you may be given additional medicines to help with side effects. Follow all directions for their use. Call your doctor or health care professional for advice if you  get a fever, chills or sore throat, or other symptoms of a cold or flu. Do not treat yourself. This drug decreases your body's ability to fight infections. Try to avoid being around people who are sick. This medicine may increase your risk to bruise or bleed. Call your doctor or health care professional if you notice any unusual bleeding. Be careful brushing and flossing your teeth or using a toothpick because you may get an infection or bleed more easily. If you have any dental work done, tell your dentist you are receiving this medicine. Avoid taking products that contain aspirin,  acetaminophen, ibuprofen, naproxen, or ketoprofen unless instructed by your doctor. These medicines may hide a fever. Call your doctor or health care professional if you get diarrhea or mouth sores. Do not treat yourself. To protect your kidneys, drink water or other fluids as directed while you are taking this medicine. Men and women must use effective birth control while taking this medicine. You may also need to continue using effective birth control for a time after stopping this medicine. Do not become pregnant while taking this medicine. Tell your doctor right away if you think that you or your partner might be pregnant. There is a potential for serious side effects to an unborn child. Talk to your health care professional or pharmacist for more information. Do not breast-feed an infant while taking this medicine. This medicine may lower sperm counts. What side effects may I notice from receiving this medicine? Side effects that you should report to your doctor or health care professional as soon as possible: -allergic reactions like skin rash, itching or hives, swelling of the face, lips, or tongue -low blood counts - this medicine may decrease the number of white blood cells, red blood cells and platelets. You may be at increased risk for infections and bleeding. -signs of infection - fever or chills, cough, sore throat, pain or difficulty passing urine -signs of decreased platelets or bleeding - bruising, pinpoint red spots on the skin, black, tarry stools, blood in the urine -signs of decreased red blood cells - unusually weak or tired, fainting spells, lightheadedness -breathing problems, like a dry cough -changes in emotions or moods -chest pain -confusion -diarrhea -high blood pressure -mouth or throat sores or ulcers -pain, swelling, warmth in the leg -pain on swallowing -swelling of the ankles, feet, hands -trouble passing urine or change in the amount of  urine -vomiting -yellowing of the eyes or skin Side effects that usually do not require medical attention (report to your doctor or health care professional if they continue or are bothersome): -hair loss -loss of appetite -nausea -stomach upset This list may not describe all possible side effects. Call your doctor for medical advice about side effects. You may report side effects to FDA at 1-800-FDA-1088. Where should I keep my medicine? This drug is given in a hospital or clinic and will not be stored at home. NOTE: This sheet is a summary. It may not cover all possible information. If you have questions about this medicine, talk to your doctor, pharmacist, or health care provider.    2016, Elsevier/Gold Standard. (2007-12-21 13:24:03)

## 2016-03-27 NOTE — Telephone Encounter (Signed)
AVS report and appointment schedule given to patient, per 03/27/16 los.

## 2016-03-27 NOTE — Progress Notes (Signed)
[place] [phone]   Fax:(336) [phone]  OFFICE PROGRESS NOTE  No PCP Per Patient No address on file  DIAGNOSIS: stage IV (T3, N0, M1 a) non-small cell lung cancer, adenocarcinoma presented with large right lower lobe lung mass, right large pleural effusion with collapse of the right lung and shift of mediastinum and heart to the left in addition to left lung pulmonary nodules diagnosed in August 2017.  FOUNDATION ONE: Molecular study:  Genomic Alteration Identified? KRAS G12C Additional Findings? Microsatellite status Cannot Be Determined Tumor Mutation Burden Cannot Be Determined Additional Disease-relevant Genes with No Reportable Alterations Identified? EGFR ALK BRAF MET RET ERBB2 ROS1  PRIOR THERAPY: None.  CURRENT THERAPY: Systemic chemotherapy with carboplatin for AUC of 5, Alimta 500 MG/M2 and Avastin 15 MG/KG every 3 weeks. First dose 03/05/2016. Status post one cycle.  INTERVAL HISTORY: Roger Barnes 55 y.o. male returns to the clinic today for follow-up visit. The patient has no complaints today. He tolerated the first cycle of his treatment fairly well with no significant adverse effects. He has no nausea or vomiting. He has no fever or chills.. He still has drainage from the left Pleurx catheter but now down to less than 150 ML 3 times a week. He denied having any significant nausea, vomiting, diarrhea or constipation. He denied having any cough or hemoptysis. He is here today for evaluation before starting cycle #2.  MEDICAL HISTORY: Past Medical History:  Diagnosis Date  . Adenocarcinoma of right lung, stage 4 ([place])   . Encounter for antineoplastic chemotherapy 02/26/2016  . Pleural effusion, right 01/25/2016  . Tachycardia     ALLERGIES:  is allergic to no known allergies.  MEDICATIONS:  Current Outpatient Prescriptions  Medication Sig Dispense Refill  . bisacodyl (DULCOLAX) 5 MG EC tablet Take 2 tablets (10 mg  total) by mouth daily. 30 tablet 0  . dexamethasone (DECADRON) 4 MG tablet 4 mg by mouth twice a day the day before, day of and day after the chemotherapy every 3 weeks 40 tablet 1  . feeding supplement, ENSURE ENLIVE, (ENSURE ENLIVE) LIQD Take 237 mLs by mouth 2 (two) times daily between meals. 497 mL 12  . folic acid (FOLVITE) 1 MG tablet Take 1 tablet (1 mg total) by mouth daily. 30 tablet 4  . levalbuterol (XOPENEX) 0.63 MG/3ML nebulizer solution Take 3 mLs (0.63 mg total) by nebulization 2 (two) times daily. 3 mL 12  . lidocaine-prilocaine (EMLA) cream Apply 1 application topically as needed. 30 g 0  . metoprolol tartrate (LOPRESSOR) 25 MG tablet Take 1 tablet (25 mg total) by mouth 2 (two) times daily. 60 tablet 12  . Multiple Vitamins-Minerals (MULTIVITAMIN WITH MINERALS) tablet Take 1 tablet by mouth daily.    Marland Kitchen acetaminophen (TYLENOL) 325 MG tablet Take 2 tablets (650 mg total) by mouth every 6 (six) hours as needed for mild pain (or Fever >/= 101). (Patient not taking: Reported on 03/27/2016) 10 tablet 0  . HYDROcodone-acetaminophen (NORCO/VICODIN) 5-325 MG tablet Take 1-2 tablets by mouth every 4 (four) hours as needed for moderate pain. (Patient not taking: Reported on 03/27/2016) 45 tablet 0  . polyethylene glycol (MIRALAX / GLYCOLAX) packet Take 17 g by mouth daily as needed for mild constipation. (Patient not taking: Reported on 03/27/2016) 14 each 0  . prochlorperazine (COMPAZINE) 10 MG tablet Take 1 tablet (10 mg total) by mouth every 6 (six) hours as needed for nausea or vomiting. (Patient not taking: Reported on  03/27/2016) 30 tablet 0  . traMADol (ULTRAM) 50 MG tablet Take 1 tablet (50 mg total) by mouth every 6 (six) hours as needed (mild pain). (Patient not taking: Reported on 03/27/2016) 30 tablet 1   No current facility-administered medications for this visit.     SURGICAL HISTORY:  Past Surgical History:  Procedure Laterality Date  . CHEST TUBE INSERTION Right 02/01/2016    Procedure: INSERTION PLEURAL DRAINAGE CATHETER;  Surgeon: Ivin Poot, MD;  Location: [place];  Service: Thoracic;  Laterality: Right;  . PLEURADESIS Right 02/01/2016   Procedure: DOXYCYCLINE PLEURADESIS;  Surgeon: Ivin Poot, MD;  Location: [place];  Service: Thoracic;  Laterality: Right;  . PORTACATH PLACEMENT Right 03/06/2016   Procedure: INSERTION PORT-A-CATH;  Surgeon: Ivin Poot, MD;  Location: [facility] [place];  Service: Thoracic;  Laterality: Right;  . THORACENTESIS Right 01/25/2016  . VIDEO ASSISTED THORACOSCOPY Right 02/01/2016   Procedure: VIDEO ASSISTED THORACOSCOPY;  Surgeon: Ivin Poot, MD;  Location: [place];  Service: Thoracic;  Laterality: Right;  Marland Kitchen VIDEO BRONCHOSCOPY N/A 02/01/2016   Procedure: VIDEO BRONCHOSCOPY;  Surgeon: Ivin Poot, MD;  Location: [facility];  Service: Thoracic;  Laterality: N/A;  . WISDOM TOOTH EXTRACTION      REVIEW OF SYSTEMS:  A comprehensive review of systems was negative except for: Constitutional: positive for fatigue Respiratory: positive for dyspnea on exertion   PHYSICAL EXAMINATION: General appearance: alert, cooperative, fatigued and no distress Head: Normocephalic, without obvious abnormality, atraumatic Neck: no adenopathy, no JVD, supple, symmetrical, trachea midline and thyroid not enlarged, symmetric, no tenderness/mass/nodules Lymph nodes: Cervical, supraclavicular, and axillary nodes normal. Resp: diminished breath sounds RLL and dullness to percussion RLL Back: symmetric, no curvature. ROM normal. No CVA tenderness. Cardio: regular rate and rhythm, S1, S2 normal, no murmur, click, rub or gallop GI: soft, non-tender; bowel sounds normal; no masses,  no organomegaly Extremities: extremities normal, atraumatic, no cyanosis or edema Neurologic: Alert and oriented X 3, normal strength and tone. Normal symmetric reflexes. Normal coordination and gait  ECOG PERFORMANCE STATUS: 1 - Symptomatic but completely ambulatory  Blood pressure 118/73,  pulse 84, temperature 97.3 F (36.3 C), temperature source Oral, resp. rate 17, height '5\' 10"'$  (1.778 m), weight 156 lb 4.8 oz (70.9 kg), SpO2 99 %.  LABORATORY DATA: Lab Results  Component Value Date   WBC 3.3 (L) 03/27/2016   HGB 11.0 (L) 03/27/2016   HCT 33.8 (L) 03/27/2016   MCV 88.5 03/27/2016   PLT 459 (H) 03/27/2016      Chemistry      Component Value Date/Time   NA 141 03/19/2016 1159   K 3.4 (L) 03/19/2016 1159   CL 105 03/05/2016 1206   CO2 28 03/19/2016 1159   BUN 10.1 03/19/2016 1159   CREATININE 1.0 03/19/2016 1159      Component Value Date/Time   CALCIUM 8.8 03/19/2016 1159   ALKPHOS 96 03/19/2016 1159   AST 22 03/19/2016 1159   ALT 13 03/19/2016 1159   BILITOT 0.37 03/19/2016 1159       RADIOGRAPHIC STUDIES: Dg Chest 2 View  Result Date: 03/05/2016 CLINICAL DATA:  Preop for Port-A-Cath placement, follow-up, history of previous surgery for right lung carcinoma EXAM: CHEST  2 VIEW COMPARISON:  Chest x-ray of 02/26/2016 FINDINGS: There is little change in apparently loculated right basilar hydro pneumothorax with PleurX catheter present. Opacity with the in the somewhat ectatic right lung is again noted as are somewhat nodular opacities in the left mid and lower  lung. These opacities could be inflammatory infectious, but metastatic disease cannot be excluded. Mediastinal and hilar contours are unremarkable. Heart size is stable. No acute bony abnormality is seen. IMPRESSION: 1. Stable loculated right basilar hydro pneumothorax. 2. No change in opacities throughout the somewhat atelectatic right lung and in the left mid lung and left lung base. These changes may be inflammatory or infectious, but a neoplastic process is a definite consideration. Electronically Signed   By: Ivar Drape M.D.   On: 03/05/2016 13:12   Dg Chest 2 View  Result Date: 02/26/2016 CLINICAL DATA:  55 year old male with right lung adenocarcinoma. Right pleural drain in place. Subsequent  encounter. EXAM: CHEST  2 VIEW COMPARISON:  PET-CT 02/22/2016 and earlier. FINDINGS: Moderate-sized right hydro pneumothorax with antra lateral approach pleural drain in place appears stable from the recent PET-CT. Irregular and nodular opacity throughout the aerated right upper lobe. Abnormal right lower lobe mostly obscured by the fluid radiographically. Stable mediastinal contours. Visualized tracheal air column is within normal limits. Stable left lung with small peripheral left lower lobe pulmonary nodules which were also better seen by CT. No left pleural effusion or edema. Stable visualized osseous structures. IMPRESSION: Stable chest from the recent PET-CT. Moderate-sized right hydropneumothorax with stable right pleural drain. Abnormal right upper and lower lobe opacity. Small peripheral left lower lobe pulmonary nodules. Electronically Signed   By: Genevie Ann M.D.   On: 02/26/2016 15:05   Dg Chest Port 1 View  Result Date: 03/06/2016 CLINICAL DATA:  Right-sided Port-A-Cath placement, history of right pneumothorax EXAM: PORTABLE CHEST 1 VIEW COMPARISON:  Chest x-ray of 03/05/2016 FINDINGS: The loculated right basilar hydro pneumothorax is again noted. A right Port-A-Cath is present with the tip seen to the region of the right atrium. Somewhat prominent markings remain throughout the left lung as noted previously. IMPRESSION: 1. Right Port-A-Cath tip overlies the expected right atrium. 2. No change in loculated right basilar hydro pneumothorax. 3. No change in coarse markings throughout the atelectatic right lung and diffusely throughout the left lung. Electronically Signed   By: Ivar Drape M.D.   On: 03/06/2016 11:48   Dg Fluoro Guide Cv Line-no Report  Result Date: 03/06/2016 CLINICAL DATA:  FLOURO GUIDE CV LINE Fluoroscopy was utilized by the requesting physician.  No radiographic interpretation.    ASSESSMENT AND PLAN: This is a very pleasant 55 years old white male recently diagnosed with a  stage IV non-small cell lung cancer, adenocarcinoma with negative EGFR, ALK, ROS 1 and BRAF diagnosed in September 2017 and presented with large right lower lobe lung mass in addition to large right pleural effusion and right upper lobe as well as left lung pulmonary nodules. The patient is currently on systemic chemotherapy was carboplatin, Alimta and Avastin status post 1 cycle. He tolerated the first cycle of his treatment well with no significant adverse effects. I recommended for the patient to proceed with cycle #2 today as a scheduled. He will come back for follow-up visit in 3 weeks for evaluation and management of any adverse effect of his treatment before starting cycle #3. He was advised to call immediately if he has any concerning symptoms in the interval. The patient voices understanding of current disease status and treatment options and is in agreement with the current care plan.  All questions were answered. The patient knows to call the clinic with any problems, questions or concerns. We can certainly see the patient much sooner if necessary.  Disclaimer: This note was dictated  with voice recognition software. Similar sounding words can inadvertently be transcribed and may not be corrected upon review.

## 2016-04-02 ENCOUNTER — Other Ambulatory Visit (HOSPITAL_BASED_OUTPATIENT_CLINIC_OR_DEPARTMENT_OTHER): Payer: Medicare HMO

## 2016-04-02 DIAGNOSIS — C3431 Malignant neoplasm of lower lobe, right bronchus or lung: Secondary | ICD-10-CM

## 2016-04-02 DIAGNOSIS — J90 Pleural effusion, not elsewhere classified: Secondary | ICD-10-CM

## 2016-04-02 DIAGNOSIS — C3491 Malignant neoplasm of unspecified part of right bronchus or lung: Secondary | ICD-10-CM

## 2016-04-02 LAB — CBC WITH DIFFERENTIAL/PLATELET
BASO%: 0.4 % (ref 0.0–2.0)
Basophils Absolute: 0 10*3/uL (ref 0.0–0.1)
EOS%: 0.3 % (ref 0.0–7.0)
Eosinophils Absolute: 0 10*3/uL (ref 0.0–0.5)
HCT: 33.8 % — ABNORMAL LOW (ref 38.4–49.9)
HGB: 11.2 g/dL — ABNORMAL LOW (ref 13.0–17.1)
LYMPH%: 22.7 % (ref 14.0–49.0)
MCH: 29 pg (ref 27.2–33.4)
MCHC: 33.1 g/dL (ref 32.0–36.0)
MCV: 87.9 fL (ref 79.3–98.0)
MONO#: 0.2 10*3/uL (ref 0.1–0.9)
MONO%: 5.1 % (ref 0.0–14.0)
NEUT%: 71.5 % (ref 39.0–75.0)
NEUTROS ABS: 2.2 10*3/uL (ref 1.5–6.5)
PLATELETS: 528 10*3/uL — AB (ref 140–400)
RBC: 3.84 10*6/uL — AB (ref 4.20–5.82)
RDW: 15.6 % — ABNORMAL HIGH (ref 11.0–14.6)
WBC: 3.1 10*3/uL — AB (ref 4.0–10.3)
lymph#: 0.7 10*3/uL — ABNORMAL LOW (ref 0.9–3.3)

## 2016-04-02 LAB — COMPREHENSIVE METABOLIC PANEL
ALBUMIN: 2.9 g/dL — AB (ref 3.5–5.0)
ALT: 19 U/L (ref 0–55)
ANION GAP: 8 meq/L (ref 3–11)
AST: 26 U/L (ref 5–34)
Alkaline Phosphatase: 102 U/L (ref 40–150)
BUN: 15.1 mg/dL (ref 7.0–26.0)
CO2: 29 meq/L (ref 22–29)
CREATININE: 0.9 mg/dL (ref 0.7–1.3)
Calcium: 8.9 mg/dL (ref 8.4–10.4)
Chloride: 101 mEq/L (ref 98–109)
GLUCOSE: 115 mg/dL (ref 70–140)
POTASSIUM: 4.1 meq/L (ref 3.5–5.1)
SODIUM: 138 meq/L (ref 136–145)
TOTAL PROTEIN: 6.9 g/dL (ref 6.4–8.3)
Total Bilirubin: 0.55 mg/dL (ref 0.20–1.20)

## 2016-04-09 ENCOUNTER — Other Ambulatory Visit (HOSPITAL_BASED_OUTPATIENT_CLINIC_OR_DEPARTMENT_OTHER): Payer: Medicare HMO

## 2016-04-09 DIAGNOSIS — C3491 Malignant neoplasm of unspecified part of right bronchus or lung: Secondary | ICD-10-CM

## 2016-04-09 DIAGNOSIS — C3431 Malignant neoplasm of lower lobe, right bronchus or lung: Secondary | ICD-10-CM

## 2016-04-09 DIAGNOSIS — J90 Pleural effusion, not elsewhere classified: Secondary | ICD-10-CM

## 2016-04-09 LAB — CBC WITH DIFFERENTIAL/PLATELET
BASO%: 0.1 % (ref 0.0–2.0)
BASOS ABS: 0 10*3/uL (ref 0.0–0.1)
EOS ABS: 0 10*3/uL (ref 0.0–0.5)
EOS%: 0.3 % (ref 0.0–7.0)
HCT: 32.4 % — ABNORMAL LOW (ref 38.4–49.9)
HEMOGLOBIN: 10.6 g/dL — AB (ref 13.0–17.1)
LYMPH%: 15.4 % (ref 14.0–49.0)
MCH: 28.3 pg (ref 27.2–33.4)
MCHC: 32.6 g/dL (ref 32.0–36.0)
MCV: 86.8 fL (ref 79.3–98.0)
MONO#: 0.5 10*3/uL (ref 0.1–0.9)
MONO%: 11.1 % (ref 0.0–14.0)
NEUT#: 3.5 10*3/uL (ref 1.5–6.5)
NEUT%: 73.1 % (ref 39.0–75.0)
PLATELETS: 114 10*3/uL — AB (ref 140–400)
RBC: 3.74 10*6/uL — ABNORMAL LOW (ref 4.20–5.82)
RDW: 15.6 % — AB (ref 11.0–14.6)
WBC: 4.8 10*3/uL (ref 4.0–10.3)
lymph#: 0.7 10*3/uL — ABNORMAL LOW (ref 0.9–3.3)

## 2016-04-09 LAB — COMPREHENSIVE METABOLIC PANEL
ALBUMIN: 3 g/dL — AB (ref 3.5–5.0)
ALK PHOS: 109 U/L (ref 40–150)
ALT: 25 U/L (ref 0–55)
ANION GAP: 9 meq/L (ref 3–11)
AST: 30 U/L (ref 5–34)
BILIRUBIN TOTAL: 0.31 mg/dL (ref 0.20–1.20)
BUN: 10.1 mg/dL (ref 7.0–26.0)
CO2: 28 mEq/L (ref 22–29)
Calcium: 9.3 mg/dL (ref 8.4–10.4)
Chloride: 105 mEq/L (ref 98–109)
Creatinine: 0.9 mg/dL (ref 0.7–1.3)
GLUCOSE: 117 mg/dL (ref 70–140)
POTASSIUM: 4.3 meq/L (ref 3.5–5.1)
SODIUM: 142 meq/L (ref 136–145)
TOTAL PROTEIN: 7.2 g/dL (ref 6.4–8.3)

## 2016-04-16 ENCOUNTER — Ambulatory Visit (HOSPITAL_BASED_OUTPATIENT_CLINIC_OR_DEPARTMENT_OTHER): Payer: Medicare HMO

## 2016-04-16 ENCOUNTER — Encounter: Payer: Self-pay | Admitting: Internal Medicine

## 2016-04-16 ENCOUNTER — Ambulatory Visit (HOSPITAL_BASED_OUTPATIENT_CLINIC_OR_DEPARTMENT_OTHER): Payer: Medicare HMO | Admitting: Internal Medicine

## 2016-04-16 ENCOUNTER — Telehealth: Payer: Self-pay | Admitting: Internal Medicine

## 2016-04-16 ENCOUNTER — Other Ambulatory Visit (HOSPITAL_BASED_OUTPATIENT_CLINIC_OR_DEPARTMENT_OTHER): Payer: Medicare HMO

## 2016-04-16 VITALS — BP 120/82 | HR 105 | Temp 98.4°F | Resp 18 | Ht 70.0 in | Wt 153.3 lb

## 2016-04-16 VITALS — HR 91

## 2016-04-16 DIAGNOSIS — C3431 Malignant neoplasm of lower lobe, right bronchus or lung: Secondary | ICD-10-CM

## 2016-04-16 DIAGNOSIS — C3491 Malignant neoplasm of unspecified part of right bronchus or lung: Secondary | ICD-10-CM

## 2016-04-16 DIAGNOSIS — Z5111 Encounter for antineoplastic chemotherapy: Secondary | ICD-10-CM

## 2016-04-16 DIAGNOSIS — Z5112 Encounter for antineoplastic immunotherapy: Secondary | ICD-10-CM

## 2016-04-16 DIAGNOSIS — J90 Pleural effusion, not elsewhere classified: Secondary | ICD-10-CM

## 2016-04-16 DIAGNOSIS — J910 Malignant pleural effusion: Secondary | ICD-10-CM

## 2016-04-16 LAB — COMPREHENSIVE METABOLIC PANEL
ALBUMIN: 3.2 g/dL — AB (ref 3.5–5.0)
ALK PHOS: 110 U/L (ref 40–150)
ALT: 13 U/L (ref 0–55)
ANION GAP: 11 meq/L (ref 3–11)
AST: 21 U/L (ref 5–34)
BILIRUBIN TOTAL: 0.33 mg/dL (ref 0.20–1.20)
BUN: 14 mg/dL (ref 7.0–26.0)
CO2: 24 mEq/L (ref 22–29)
Calcium: 9.6 mg/dL (ref 8.4–10.4)
Chloride: 103 mEq/L (ref 98–109)
Creatinine: 1.1 mg/dL (ref 0.7–1.3)
EGFR: 77 mL/min/{1.73_m2} — AB (ref >90)
Glucose: 193 mg/dl — ABNORMAL HIGH (ref 70–140)
Potassium: 4.4 mEq/L (ref 3.5–5.1)
Sodium: 138 mEq/L (ref 136–145)
TOTAL PROTEIN: 7.6 g/dL (ref 6.4–8.3)

## 2016-04-16 LAB — CBC WITH DIFFERENTIAL/PLATELET
BASO%: 0.1 % (ref 0.0–2.0)
Basophils Absolute: 0 10*3/uL (ref 0.0–0.1)
EOS ABS: 0 10*3/uL (ref 0.0–0.5)
EOS%: 0 % (ref 0.0–7.0)
HCT: 34.1 % — ABNORMAL LOW (ref 38.4–49.9)
HEMOGLOBIN: 11.1 g/dL — AB (ref 13.0–17.1)
LYMPH%: 6.6 % — AB (ref 14.0–49.0)
MCH: 28.7 pg (ref 27.2–33.4)
MCHC: 32.4 g/dL (ref 32.0–36.0)
MCV: 88.7 fL (ref 79.3–98.0)
MONO#: 0.4 10*3/uL (ref 0.1–0.9)
MONO%: 6.6 % (ref 0.0–14.0)
NEUT%: 86.7 % — ABNORMAL HIGH (ref 39.0–75.0)
NEUTROS ABS: 5.8 10*3/uL (ref 1.5–6.5)
PLATELETS: 240 10*3/uL (ref 140–400)
RBC: 3.85 10*6/uL — AB (ref 4.20–5.82)
RDW: 16.9 % — ABNORMAL HIGH (ref 11.0–14.6)
WBC: 6.7 10*3/uL (ref 4.0–10.3)
lymph#: 0.4 10*3/uL — ABNORMAL LOW (ref 0.9–3.3)

## 2016-04-16 LAB — UA PROTEIN, DIPSTICK - CHCC: PROTEIN: 30 mg/dL

## 2016-04-16 MED ORDER — SODIUM CHLORIDE 0.9 % IV SOLN
525.0000 mg/m2 | Freq: Once | INTRAVENOUS | Status: AC
  Administered 2016-04-16: 1000 mg via INTRAVENOUS
  Filled 2016-04-16: qty 40

## 2016-04-16 MED ORDER — SODIUM CHLORIDE 0.9 % IV SOLN
15.0000 mg/kg | Freq: Once | INTRAVENOUS | Status: AC
  Administered 2016-04-16: 1100 mg via INTRAVENOUS
  Filled 2016-04-16: qty 32

## 2016-04-16 MED ORDER — DEXAMETHASONE SODIUM PHOSPHATE 10 MG/ML IJ SOLN
10.0000 mg | Freq: Once | INTRAMUSCULAR | Status: AC
  Administered 2016-04-16: 10 mg via INTRAVENOUS

## 2016-04-16 MED ORDER — SODIUM CHLORIDE 0.9% FLUSH
10.0000 mL | INTRAVENOUS | Status: DC | PRN
  Administered 2016-04-16: 10 mL
  Filled 2016-04-16: qty 10

## 2016-04-16 MED ORDER — CYANOCOBALAMIN 1000 MCG/ML IJ SOLN
1000.0000 ug | Freq: Once | INTRAMUSCULAR | Status: AC
  Administered 2016-04-16: 1000 ug via INTRAMUSCULAR

## 2016-04-16 MED ORDER — SODIUM CHLORIDE 0.9 % IV SOLN
500.0000 mg | Freq: Once | INTRAVENOUS | Status: AC
  Administered 2016-04-16: 500 mg via INTRAVENOUS
  Filled 2016-04-16: qty 50

## 2016-04-16 MED ORDER — SODIUM CHLORIDE 0.9 % IV SOLN
Freq: Once | INTRAVENOUS | Status: AC
  Administered 2016-04-16: 10:00:00 via INTRAVENOUS

## 2016-04-16 MED ORDER — DEXAMETHASONE SODIUM PHOSPHATE 10 MG/ML IJ SOLN
INTRAMUSCULAR | Status: AC
  Filled 2016-04-16: qty 1

## 2016-04-16 MED ORDER — PALONOSETRON HCL INJECTION 0.25 MG/5ML
INTRAVENOUS | Status: AC
  Filled 2016-04-16: qty 5

## 2016-04-16 MED ORDER — HEPARIN SOD (PORK) LOCK FLUSH 100 UNIT/ML IV SOLN
500.0000 [IU] | Freq: Once | INTRAVENOUS | Status: AC | PRN
  Administered 2016-04-16: 500 [IU]
  Filled 2016-04-16: qty 5

## 2016-04-16 MED ORDER — CYANOCOBALAMIN 1000 MCG/ML IJ SOLN
INTRAMUSCULAR | Status: AC
  Filled 2016-04-16: qty 1

## 2016-04-16 MED ORDER — PALONOSETRON HCL INJECTION 0.25 MG/5ML
0.2500 mg | Freq: Once | INTRAVENOUS | Status: AC
  Administered 2016-04-16: 0.25 mg via INTRAVENOUS

## 2016-04-16 NOTE — Telephone Encounter (Signed)
Message sent to chemo scheduler to be added. Follow up appointments and labs scheduled, per 04/16/16 los. A copy of the  AVS report and appointment schedule, was given to patient per 04/16/16 los.  ° °

## 2016-04-16 NOTE — Progress Notes (Signed)
[place] [phone]   [phone]) [phone]  OFFICE PROGRESS NOTE  No PCP Per Patient No address on file  DIAGNOSIS: stage IV (T3, N0, M1 a) non-small cell lung cancer, adenocarcinoma presented with large right lower lobe lung mass, right large pleural effusion with collapse of the right lung and shift of mediastinum and heart to the left in addition to left lung pulmonary nodules diagnosed in August 2017.  FOUNDATION ONE: Molecular study:  Genomic Alteration Identified? KRAS G12C Additional Findings? Microsatellite status Cannot Be Determined Tumor Mutation Burden Cannot Be Determined Additional Disease-relevant Genes with No Reportable Alterations Identified? EGFR ALK BRAF MET RET ERBB2 ROS1  PRIOR THERAPY: None.  CURRENT THERAPY: Systemic chemotherapy with carboplatin for AUC of 5, Alimta 500 MG/M2 and Avastin 15 MG/KG every 3 weeks. First dose 03/05/2016. Status post 2 cycles.  INTERVAL HISTORY: Roger Barnes 55 y.o. male returns to the clinic today for follow-up visit. The patient has no complaints today. He tolerated the second cycle of his treatment fairly well with no significant adverse effects. He has some runny nose recently but no fever or chills. He has no nausea or vomiting. He has less drainage from the Pleurx catheter. He denied having any significant nausea, vomiting, diarrhea or constipation. He denied having any cough or hemoptysis. He is here today for evaluation before starting cycle #3.  MEDICAL HISTORY: Past Medical History:  Diagnosis Date  . Adenocarcinoma of right lung, stage 4 ([place])   . Encounter for antineoplastic chemotherapy 02/26/2016  . Pleural effusion, right 01/25/2016  . Tachycardia     ALLERGIES:  is allergic to no known allergies.  MEDICATIONS:  Current Outpatient Prescriptions  Medication Sig Dispense Refill  . acetaminophen (TYLENOL) 325 MG tablet Take 2 tablets (650 mg total) by mouth every 6  (six) hours as needed for mild pain (or Fever >/= 101). (Patient not taking: Reported on 03/27/2016) 10 tablet 0  . bisacodyl (DULCOLAX) 5 MG EC tablet Take 2 tablets (10 mg total) by mouth daily. 30 tablet 0  . dexamethasone (DECADRON) 4 MG tablet 4 mg by mouth twice a day the day before, day of and day after the chemotherapy every 3 weeks 40 tablet 1  . feeding supplement, ENSURE ENLIVE, (ENSURE ENLIVE) LIQD Take 237 mLs by mouth 2 (two) times daily between meals. 154 mL 12  . folic acid (FOLVITE) 1 MG tablet Take 1 tablet (1 mg total) by mouth daily. 30 tablet 4  . HYDROcodone-acetaminophen (NORCO/VICODIN) 5-325 MG tablet Take 1-2 tablets by mouth every 4 (four) hours as needed for moderate pain. (Patient not taking: Reported on 03/27/2016) 45 tablet 0  . levalbuterol (XOPENEX) 0.63 MG/3ML nebulizer solution Take 3 mLs (0.63 mg total) by nebulization 2 (two) times daily. 3 mL 12  . lidocaine-prilocaine (EMLA) cream Apply 1 application topically as needed. 30 g 0  . metoprolol tartrate (LOPRESSOR) 25 MG tablet Take 1 tablet (25 mg total) by mouth 2 (two) times daily. 60 tablet 12  . Multiple Vitamins-Minerals (MULTIVITAMIN WITH MINERALS) tablet Take 1 tablet by mouth daily.    . polyethylene glycol (MIRALAX / GLYCOLAX) packet Take 17 g by mouth daily as needed for mild constipation. (Patient not taking: Reported on 03/27/2016) 14 each 0  . prochlorperazine (COMPAZINE) 10 MG tablet Take 1 tablet (10 mg total) by mouth every 6 (six) hours as needed for nausea or vomiting. (Patient not taking: Reported on 03/27/2016) 30 tablet 0  . traMADol (ULTRAM)  50 MG tablet Take 1 tablet (50 mg total) by mouth every 6 (six) hours as needed (mild pain). (Patient not taking: Reported on 03/27/2016) 30 tablet 1   No current facility-administered medications for this visit.     SURGICAL HISTORY:  Past Surgical History:  Procedure Laterality Date  . CHEST TUBE INSERTION Right 02/01/2016   Procedure: INSERTION  PLEURAL DRAINAGE CATHETER;  Surgeon: Ivin Poot, MD;  Location: [place];  Service: Thoracic;  Laterality: Right;  . PLEURADESIS Right 02/01/2016   Procedure: DOXYCYCLINE PLEURADESIS;  Surgeon: Ivin Poot, MD;  Location: [place];  Service: Thoracic;  Laterality: Right;  . PORTACATH PLACEMENT Right 03/06/2016   Procedure: INSERTION PORT-A-CATH;  Surgeon: Ivin Poot, MD;  Location: [place];  Service: Thoracic;  Laterality: Right;  . THORACENTESIS Right 01/25/2016  . VIDEO ASSISTED THORACOSCOPY Right 02/01/2016   Procedure: VIDEO ASSISTED THORACOSCOPY;  Surgeon: Ivin Poot, MD;  Location: [place];  Service: Thoracic;  Laterality: Right;  Marland Kitchen VIDEO BRONCHOSCOPY N/A 02/01/2016   Procedure: VIDEO BRONCHOSCOPY;  Surgeon: Ivin Poot, MD;  Location: [facility];  Service: Thoracic;  Laterality: N/A;  . WISDOM TOOTH EXTRACTION      REVIEW OF SYSTEMS:  A comprehensive review of systems was negative except for: Ears, nose, mouth, throat, and face: positive for Runny nose Respiratory: positive for dyspnea on exertion   PHYSICAL EXAMINATION: General appearance: alert, cooperative, fatigued and no distress Head: Normocephalic, without obvious abnormality, atraumatic Neck: no adenopathy, no JVD, supple, symmetrical, trachea midline and thyroid not enlarged, symmetric, no tenderness/mass/nodules Lymph nodes: Cervical, supraclavicular, and axillary nodes normal. Resp: diminished breath sounds RLL and dullness to percussion RLL Back: symmetric, no curvature. ROM normal. No CVA tenderness. Cardio: regular rate and rhythm, S1, S2 normal, no murmur, click, rub or gallop GI: soft, non-tender; bowel sounds normal; no masses,  no organomegaly Extremities: extremities normal, atraumatic, no cyanosis or edema Neurologic: Alert and oriented X 3, normal strength and tone. Normal symmetric reflexes. Normal coordination and gait  ECOG PERFORMANCE STATUS: 1 - Symptomatic but completely ambulatory  Blood pressure  120/82, pulse (!) 105, temperature 98.4 F (36.9 C), temperature source Oral, resp. rate 18, height 5' 10"  (1.778 m), weight 153 lb 4.8 oz (69.5 kg), SpO2 99 %.  LABORATORY DATA: Lab Results  Component Value Date   WBC 6.7 04/16/2016   HGB 11.1 (L) 04/16/2016   HCT 34.1 (L) 04/16/2016   MCV 88.7 04/16/2016   PLT 240 04/16/2016      Chemistry      Component Value Date/Time   NA 142 04/09/2016 1136   K 4.3 04/09/2016 1136   CL 105 03/05/2016 1206   CO2 28 04/09/2016 1136   BUN 10.1 04/09/2016 1136   CREATININE 0.9 04/09/2016 1136      Component Value Date/Time   CALCIUM 9.3 04/09/2016 1136   ALKPHOS 109 04/09/2016 1136   AST 30 04/09/2016 1136   ALT 25 04/09/2016 1136   BILITOT 0.31 04/09/2016 1136       RADIOGRAPHIC STUDIES: No results found.  ASSESSMENT AND PLAN: This is a very pleasant 55 years old white male recently diagnosed with a stage IV non-small cell lung cancer, adenocarcinoma with negative EGFR, ALK, ROS 1 and BRAF diagnosed in September 2017 and presented with large right lower lobe lung mass in addition to large right pleural effusion and right upper lobe as well as left lung pulmonary nodules. The patient is currently on systemic chemotherapy was carboplatin, Alimta and  Avastin status post 2 cycles. He continues to tolerate his treatment well with no significant adverse effects. I recommended for the patient to proceed with cycle #3 today as a scheduled. He will come back for follow-up visit in 3 weeks for evaluation with repeat CT scan of the chest, abdomen and pelvis for restaging of his disease before cycle #4.  He was advised to call immediately if he has any concerning symptoms in the interval. The patient voices understanding of current disease status and treatment options and is in agreement with the current care plan.  All questions were answered. The patient knows to call the clinic with any problems, questions or concerns. We can certainly see the  patient much sooner if necessary.  Disclaimer: This note was dictated with voice recognition software. Similar sounding words can inadvertently be transcribed and may not be corrected upon review.

## 2016-04-16 NOTE — Patient Instructions (Signed)
[facility] Discharge Instructions for Patients Receiving Chemotherapy  Today you received the following chemotherapy agents Alimta Carboplatin and Avastin  To help prevent nausea and vomiting after your treatment, we encourage you to take your nausea medication as directed. Take Compazine for nausea.    If you develop nausea and vomiting that is not controlled by your nausea medication, call the clinic.   BELOW ARE SYMPTOMS THAT SHOULD BE REPORTED IMMEDIATELY:  *FEVER GREATER THAN 100.5 F  *CHILLS WITH OR WITHOUT FEVER  NAUSEA AND VOMITING THAT IS NOT CONTROLLED WITH YOUR NAUSEA MEDICATION  *UNUSUAL SHORTNESS OF BREATH  *UNUSUAL BRUISING OR BLEEDING  TENDERNESS IN MOUTH AND THROAT WITH OR WITHOUT PRESENCE OF ULCERS  *URINARY PROBLEMS  *BOWEL PROBLEMS  UNUSUAL RASH Items with * indicate a potential emergency and should be followed up as soon as possible.  Feel free to call the clinic you have any questions or concerns. The clinic phone number is [phone].  Please show the [facility] at check-in to the Emergency Department and triage nurse.   Pemetrexed injection What is this medicine? PEMETREXED (PEM e TREX ed) is a chemotherapy drug. This medicine affects cells that are rapidly growing, such as cancer cells and cells in your mouth and stomach. It is usually used to treat lung cancers like non-small cell lung cancer and mesothelioma. It may also be used to treat other cancers. This medicine may be used for other purposes; ask your health care provider or pharmacist if you have questions. What should I tell my health care provider before I take this medicine? They need to know if you have any of these conditions: -if you frequently drink alcohol containing beverages -infection (especially a virus infection such as chickenpox, cold sores, or herpes) -kidney disease -liver disease -low blood counts, like low platelets, red bloods, or white blood  cells -an unusual or allergic reaction to pemetrexed, mannitol, other medicines, foods, dyes, or preservatives -pregnant or trying to get pregnant -breast-feeding How should I use this medicine? This drug is given as an infusion into a vein. It is administered in a hospital or clinic by a specially trained health care professional. Talk to your pediatrician regarding the use of this medicine in children. Special care may be needed. Overdosage: If you think you have taken too much of this medicine contact a poison control center or emergency room at once. NOTE: This medicine is only for you. Do not share this medicine with others. What if I miss a dose? It is important not to miss your dose. Call your doctor or health care professional if you are unable to keep an appointment. What may interact with this medicine? -aspirin and aspirin-like medicines -medicines to increase blood counts like filgrastim, pegfilgrastim, sargramostim -methotrexate -NSAIDS, medicines for pain and inflammation, like ibuprofen or naproxen -probenecid -pyrimethamine -vaccines Talk to your doctor or health care professional before taking any of these medicines: -acetaminophen -aspirin -ibuprofen -ketoprofen -naproxen This list may not describe all possible interactions. Give your health care provider a list of all the medicines, herbs, non-prescription drugs, or dietary supplements you use. Also tell them if you smoke, drink alcohol, or use illegal drugs. Some items may interact with your medicine. What should I watch for while using this medicine? Visit your doctor for checks on your progress. This drug may make you feel generally unwell. This is not uncommon, as chemotherapy can affect healthy cells as well as cancer cells. Report any side effects. Continue your  course of treatment even though you feel ill unless your doctor tells you to stop. In some cases, you may be given additional medicines to help with side  effects. Follow all directions for their use. Call your doctor or health care professional for advice if you get a fever, chills or sore throat, or other symptoms of a cold or flu. Do not treat yourself. This drug decreases your body's ability to fight infections. Try to avoid being around people who are sick. This medicine may increase your risk to bruise or bleed. Call your doctor or health care professional if you notice any unusual bleeding. Be careful brushing and flossing your teeth or using a toothpick because you may get an infection or bleed more easily. If you have any dental work done, tell your dentist you are receiving this medicine. Avoid taking products that contain aspirin, acetaminophen, ibuprofen, naproxen, or ketoprofen unless instructed by your doctor. These medicines may hide a fever. Call your doctor or health care professional if you get diarrhea or mouth sores. Do not treat yourself. To protect your kidneys, drink water or other fluids as directed while you are taking this medicine. Men and women must use effective birth control while taking this medicine. You may also need to continue using effective birth control for a time after stopping this medicine. Do not become pregnant while taking this medicine. Tell your doctor right away if you think that you or your partner might be pregnant. There is a potential for serious side effects to an unborn child. Talk to your health care professional or pharmacist for more information. Do not breast-feed an infant while taking this medicine. This medicine may lower sperm counts. What side effects may I notice from receiving this medicine? Side effects that you should report to your doctor or health care professional as soon as possible: -allergic reactions like skin rash, itching or hives, swelling of the face, lips, or tongue -low blood counts - this medicine may decrease the number of white blood cells, red blood cells and platelets. You  may be at increased risk for infections and bleeding. -signs of infection - fever or chills, cough, sore throat, pain or difficulty passing urine -signs of decreased platelets or bleeding - bruising, pinpoint red spots on the skin, black, tarry stools, blood in the urine -signs of decreased red blood cells - unusually weak or tired, fainting spells, lightheadedness -breathing problems, like a dry cough -changes in emotions or moods -chest pain -confusion -diarrhea -high blood pressure -mouth or throat sores or ulcers -pain, swelling, warmth in the leg -pain on swallowing -swelling of the ankles, feet, hands -trouble passing urine or change in the amount of urine -vomiting -yellowing of the eyes or skin Side effects that usually do not require medical attention (report to your doctor or health care professional if they continue or are bothersome): -hair loss -loss of appetite -nausea -stomach upset This list may not describe all possible side effects. Call your doctor for medical advice about side effects. You may report side effects to FDA at 1-800-FDA-1088. Where should I keep my medicine? This drug is given in a hospital or clinic and will not be stored at home. NOTE: This sheet is a summary. It may not cover all possible information. If you have questions about this medicine, talk to your doctor, pharmacist, or health care provider.    2016, [employer]. (2007-12-21 13:24:03)  Carboplatin injection What is this medicine? CARBOPLATIN (KAR boe pla tin) is a  chemotherapy drug. It targets fast dividing cells, like cancer cells, and causes these cells to die. This medicine is used to treat ovarian cancer and many other cancers. This medicine may be used for other purposes; ask your health care provider or pharmacist if you have questions. What should I tell my health care provider before I take this medicine? They need to know if you have any of these conditions: -blood  disorders -hearing problems -kidney disease -recent or ongoing radiation therapy -an unusual or allergic reaction to carboplatin, cisplatin, other chemotherapy, other medicines, foods, dyes, or preservatives -pregnant or trying to get pregnant -breast-feeding How should I use this medicine? This drug is usually given as an infusion into a vein. It is administered in a hospital or clinic by a specially trained health care professional. Talk to your pediatrician regarding the use of this medicine in children. Special care may be needed. Overdosage: If you think you have taken too much of this medicine contact a poison control center or emergency room at once. NOTE: This medicine is only for you. Do not share this medicine with others. What if I miss a dose? It is important not to miss a dose. Call your doctor or health care professional if you are unable to keep an appointment. What may interact with this medicine? -medicines for seizures -medicines to increase blood counts like filgrastim, pegfilgrastim, sargramostim -some antibiotics like amikacin, gentamicin, neomycin, streptomycin, tobramycin -vaccines Talk to your doctor or health care professional before taking any of these medicines: -acetaminophen -aspirin -ibuprofen -ketoprofen -naproxen This list may not describe all possible interactions. Give your health care provider a list of all the medicines, herbs, non-prescription drugs, or dietary supplements you use. Also tell them if you smoke, drink alcohol, or use illegal drugs. Some items may interact with your medicine. What should I watch for while using this medicine? Your condition will be monitored carefully while you are receiving this medicine. You will need important blood work done while you are taking this medicine. This drug may make you feel generally unwell. This is not uncommon, as chemotherapy can affect healthy cells as well as cancer cells. Report any side effects.  Continue your course of treatment even though you feel ill unless your doctor tells you to stop. In some cases, you may be given additional medicines to help with side effects. Follow all directions for their use. Call your doctor or health care professional for advice if you get a fever, chills or sore throat, or other symptoms of a cold or flu. Do not treat yourself. This drug decreases your body's ability to fight infections. Try to avoid being around people who are sick. This medicine may increase your risk to bruise or bleed. Call your doctor or health care professional if you notice any unusual bleeding. Be careful brushing and flossing your teeth or using a toothpick because you may get an infection or bleed more easily. If you have any dental work done, tell your dentist you are receiving this medicine. Avoid taking products that contain aspirin, acetaminophen, ibuprofen, naproxen, or ketoprofen unless instructed by your doctor. These medicines may hide a fever. Do not become pregnant while taking this medicine. Women should inform their doctor if they wish to become pregnant or think they might be pregnant. There is a potential for serious side effects to an unborn child. Talk to your health care professional or pharmacist for more information. Do not breast-feed an infant while taking this medicine. What side effects  may I notice from receiving this medicine? Side effects that you should report to your doctor or health care professional as soon as possible: -allergic reactions like skin rash, itching or hives, swelling of the face, lips, or tongue -signs of infection - fever or chills, cough, sore throat, pain or difficulty passing urine -signs of decreased platelets or bleeding - bruising, pinpoint red spots on the skin, black, tarry stools, nosebleeds -signs of decreased red blood cells - unusually weak or tired, fainting spells, lightheadedness -breathing problems -changes in  hearing -changes in vision -chest pain -high blood pressure -low blood counts - This drug may decrease the number of white blood cells, red blood cells and platelets. You may be at increased risk for infections and bleeding. -nausea and vomiting -pain, swelling, redness or irritation at the injection site -pain, tingling, numbness in the hands or feet -problems with balance, talking, walking -trouble passing urine or change in the amount of urine Side effects that usually do not require medical attention (report to your doctor or health care professional if they continue or are bothersome): -hair loss -loss of appetite -metallic taste in the mouth or changes in taste This list may not describe all possible side effects. Call your doctor for medical advice about side effects. You may report side effects to FDA at 1-800-FDA-1088. Where should I keep my medicine? This drug is given in a hospital or clinic and will not be stored at home. NOTE: This sheet is a summary. It may not cover all possible information. If you have questions about this medicine, talk to your doctor, pharmacist, or health care provider.    2016, [employer]. (2007-08-24 14:38:05)

## 2016-04-17 ENCOUNTER — Telehealth: Payer: Self-pay | Admitting: *Deleted

## 2016-04-17 NOTE — Telephone Encounter (Signed)
Per LOS I have scheduled appts and notified the [facility]el[facility]

## 2016-04-22 ENCOUNTER — Ambulatory Visit: Payer: Medicare HMO | Admitting: Internal Medicine

## 2016-04-23 ENCOUNTER — Other Ambulatory Visit (HOSPITAL_BASED_OUTPATIENT_CLINIC_OR_DEPARTMENT_OTHER): Payer: Medicare HMO

## 2016-04-23 ENCOUNTER — Telehealth: Payer: Self-pay | Admitting: *Deleted

## 2016-04-23 DIAGNOSIS — C3431 Malignant neoplasm of lower lobe, right bronchus or lung: Secondary | ICD-10-CM

## 2016-04-23 DIAGNOSIS — J90 Pleural effusion, not elsewhere classified: Secondary | ICD-10-CM

## 2016-04-23 DIAGNOSIS — C3491 Malignant neoplasm of unspecified part of right bronchus or lung: Secondary | ICD-10-CM

## 2016-04-23 LAB — CBC WITH DIFFERENTIAL/PLATELET
BASO%: 0.4 % (ref 0.0–2.0)
Basophils Absolute: 0 10*3/uL (ref 0.0–0.1)
EOS%: 0.4 % (ref 0.0–7.0)
Eosinophils Absolute: 0 10*3/uL (ref 0.0–0.5)
HEMATOCRIT: 32.7 % — AB (ref 38.4–49.9)
HEMOGLOBIN: 10.7 g/dL — AB (ref 13.0–17.1)
LYMPH#: 0.8 10*3/uL — AB (ref 0.9–3.3)
LYMPH%: 32.4 % (ref 14.0–49.0)
MCH: 28.9 pg (ref 27.2–33.4)
MCHC: 32.7 g/dL (ref 32.0–36.0)
MCV: 88.4 fL (ref 79.3–98.0)
MONO#: 0.3 10*3/uL (ref 0.1–0.9)
MONO%: 11.7 % (ref 0.0–14.0)
NEUT%: 55.1 % (ref 39.0–75.0)
NEUTROS ABS: 1.4 10*3/uL — AB (ref 1.5–6.5)
PLATELETS: 294 10*3/uL (ref 140–400)
RBC: 3.7 10*6/uL — ABNORMAL LOW (ref 4.20–5.82)
RDW: 17.8 % — AB (ref 11.0–14.6)
WBC: 2.6 10*3/uL — ABNORMAL LOW (ref 4.0–10.3)

## 2016-04-23 LAB — COMPREHENSIVE METABOLIC PANEL
ALBUMIN: 3.2 g/dL — AB (ref 3.5–5.0)
ALK PHOS: 101 U/L (ref 40–150)
ALT: 18 U/L (ref 0–55)
ANION GAP: 9 meq/L (ref 3–11)
AST: 32 U/L (ref 5–34)
BILIRUBIN TOTAL: 0.88 mg/dL (ref 0.20–1.20)
BUN: 11.4 mg/dL (ref 7.0–26.0)
CALCIUM: 9.2 mg/dL (ref 8.4–10.4)
CO2: 26 mEq/L (ref 22–29)
CREATININE: 0.9 mg/dL (ref 0.7–1.3)
Chloride: 104 mEq/L (ref 98–109)
EGFR: >90 mL/min/{1.73_m2} (ref >90)
Glucose: 115 mg/dl (ref 70–140)
Potassium: 4.2 mEq/L (ref 3.5–5.1)
Sodium: 139 mEq/L (ref 136–145)
TOTAL PROTEIN: 7 g/dL (ref 6.4–8.3)

## 2016-04-23 NOTE — Telephone Encounter (Signed)
Patient here for labs, requested to speak with a nurse.  Spoke with patient. He states he is having trouble with constipation, secondary to his chemotherapy. His last good Bm was two days ago.  He is taking dulcolax 5 mg twice a day and miralax daly, without relief.  He states he tried an enema last night with minimal results. Advised pt to not use anything rectally while he is on chemo d/t the increase risk of infection/bleeding.  Advised pt to try sennakot s 2 tabs tonight and 2 in the morning. If no results then to take 3 tabs tomorrow night. Also advised pt to increase his fluids, fiber intake.  Advised patient to call back on Friday to let us know how he is doing, whether he had a BM or not.  Pt. Stated he would call on Friday.  He voiced understanding of using sennakot s as described to him.

## 2016-04-25 ENCOUNTER — Telehealth: Payer: Self-pay | Admitting: *Deleted

## 2016-04-25 NOTE — Telephone Encounter (Signed)
Received call from pt stating that he is having trouble with constipation, gas pains, contractions.  He states stool is like clay balls.  He has taken senna S as instructed & took 3 last hs.  He has stopped the ducolax & miralax.  Instructed to try Magnesium Citrate & take 1/2 bottle & if no results may take the other half in 4 hours.  Informed that he needs to stay on senna unless having diarrhea & to especially start something before chemo & to push fluids before during & after chemo.  Discussed fiber/roughage foods also.  He states he will try the mag citrate & call if no results or worsening symptoms.

## 2016-04-30 ENCOUNTER — Other Ambulatory Visit (HOSPITAL_BASED_OUTPATIENT_CLINIC_OR_DEPARTMENT_OTHER): Payer: Medicare HMO

## 2016-04-30 DIAGNOSIS — J90 Pleural effusion, not elsewhere classified: Secondary | ICD-10-CM

## 2016-04-30 DIAGNOSIS — C3431 Malignant neoplasm of lower lobe, right bronchus or lung: Secondary | ICD-10-CM

## 2016-04-30 DIAGNOSIS — C3491 Malignant neoplasm of unspecified part of right bronchus or lung: Secondary | ICD-10-CM

## 2016-04-30 DIAGNOSIS — Z736 Limitation of activities due to disability: Secondary | ICD-10-CM

## 2016-04-30 LAB — CBC WITH DIFFERENTIAL/PLATELET
BASO%: 0.2 % (ref 0.0–2.0)
Basophils Absolute: 0 10*3/uL (ref 0.0–0.1)
EOS%: 0.4 % (ref 0.0–7.0)
Eosinophils Absolute: 0 10*3/uL (ref 0.0–0.5)
HCT: 29.7 % — ABNORMAL LOW (ref 38.4–49.9)
HGB: 9.7 g/dL — ABNORMAL LOW (ref 13.0–17.1)
LYMPH%: 14.9 % (ref 14.0–49.0)
MCH: 29 pg (ref 27.2–33.4)
MCHC: 32.7 g/dL (ref 32.0–36.0)
MCV: 88.8 fL (ref 79.3–98.0)
MONO#: 0.5 10*3/uL (ref 0.1–0.9)
MONO%: 13.2 % (ref 0.0–14.0)
NEUT%: 71.3 % (ref 39.0–75.0)
NEUTROS ABS: 2.9 10*3/uL (ref 1.5–6.5)
Platelets: 87 10*3/uL — ABNORMAL LOW (ref 140–400)
RBC: 3.35 10*6/uL — AB (ref 4.20–5.82)
RDW: 19.8 % — ABNORMAL HIGH (ref 11.0–14.6)
WBC: 4.1 10*3/uL (ref 4.0–10.3)
lymph#: 0.6 10*3/uL — ABNORMAL LOW (ref 0.9–3.3)

## 2016-04-30 LAB — COMPREHENSIVE METABOLIC PANEL
ALT: 19 U/L (ref 0–55)
ANION GAP: 9 meq/L (ref 3–11)
AST: 30 U/L (ref 5–34)
Albumin: 3.1 g/dL — ABNORMAL LOW (ref 3.5–5.0)
Alkaline Phosphatase: 100 U/L (ref 40–150)
BUN: 7.3 mg/dL (ref 7.0–26.0)
CHLORIDE: 106 meq/L (ref 98–109)
CO2: 26 meq/L (ref 22–29)
CREATININE: 0.9 mg/dL (ref 0.7–1.3)
Calcium: 9.2 mg/dL (ref 8.4–10.4)
EGFR: >90 mL/min/{1.73_m2} (ref >90)
GLUCOSE: 105 mg/dL (ref 70–140)
Potassium: 4.2 mEq/L (ref 3.5–5.1)
SODIUM: 141 meq/L (ref 136–145)
TOTAL PROTEIN: 6.7 g/dL (ref 6.4–8.3)
Total Bilirubin: 0.3 mg/dL (ref 0.20–1.20)

## 2016-05-02 ENCOUNTER — Telehealth: Payer: Self-pay | Admitting: *Deleted

## 2016-05-02 NOTE — Telephone Encounter (Signed)
Received call from pt stating that his CT scan has been rescheduled for Fri 05/09/16.   Spoke with radiology and was informed that scan approval is still pending.  Pt has upcoming with Dr. Julien Nordmann on 05/07/16 with lab, office visit, and chemo.  However, CT scans will be done at a later date.   Pt would like to know what he should do - keep appts as scheduled and/or to reschedule appts until after scans. Pt's     Phone    [phone].

## 2016-05-05 ENCOUNTER — Ambulatory Visit (HOSPITAL_COMMUNITY): Payer: Medicare HMO

## 2016-05-06 ENCOUNTER — Telehealth: Payer: Self-pay | Admitting: *Deleted

## 2016-05-06 NOTE — Telephone Encounter (Signed)
"  I need to know if I am to come in tomorrow and what chemotherapy plans are."  Collaborative nurse notified.  Plan pending, scans not authorized at this time.  Will call patient by end of day.

## 2016-05-06 NOTE — Telephone Encounter (Signed)
Spoke with [facility] Sched to attempt r/s CT scan- Pt scan originally planned for 12/4 but r/s due to insurance isue. Scan has not be authorized, message to [facility] to assist. Attempt to reach pt to discuss.

## 2016-05-07 ENCOUNTER — Ambulatory Visit (HOSPITAL_COMMUNITY)
Admission: RE | Admit: 2016-05-07 | Discharge: 2016-05-07 | Disposition: A | Discharge status: Home / Self Care | Payer: Medicaid Other | Source: Ambulatory Visit | Attending: Internal Medicine | Admitting: Internal Medicine

## 2016-05-07 ENCOUNTER — Ambulatory Visit (HOSPITAL_BASED_OUTPATIENT_CLINIC_OR_DEPARTMENT_OTHER): Payer: Medicaid Other

## 2016-05-07 ENCOUNTER — Other Ambulatory Visit (HOSPITAL_BASED_OUTPATIENT_CLINIC_OR_DEPARTMENT_OTHER): Payer: Medicaid Other

## 2016-05-07 ENCOUNTER — Telehealth: Payer: Self-pay | Admitting: Internal Medicine

## 2016-05-07 ENCOUNTER — Encounter: Payer: Self-pay | Admitting: Internal Medicine

## 2016-05-07 ENCOUNTER — Telehealth: Payer: Self-pay | Admitting: *Deleted

## 2016-05-07 ENCOUNTER — Encounter (HOSPITAL_COMMUNITY): Payer: Self-pay

## 2016-05-07 ENCOUNTER — Ambulatory Visit (HOSPITAL_BASED_OUTPATIENT_CLINIC_OR_DEPARTMENT_OTHER): Payer: Medicaid Other | Admitting: Internal Medicine

## 2016-05-07 VITALS — BP 118/87 | HR 103 | Temp 97.8°F | Resp 18 | Ht 70.0 in | Wt 151.4 lb

## 2016-05-07 DIAGNOSIS — M47816 Spondylosis without myelopathy or radiculopathy, lumbar region: Secondary | ICD-10-CM | POA: Insufficient documentation

## 2016-05-07 DIAGNOSIS — C7802 Secondary malignant neoplasm of left lung: Secondary | ICD-10-CM | POA: Diagnosis not present

## 2016-05-07 DIAGNOSIS — T451X5A Adverse effect of antineoplastic and immunosuppressive drugs, initial encounter: Secondary | ICD-10-CM

## 2016-05-07 DIAGNOSIS — J90 Pleural effusion, not elsewhere classified: Secondary | ICD-10-CM

## 2016-05-07 DIAGNOSIS — C3431 Malignant neoplasm of lower lobe, right bronchus or lung: Secondary | ICD-10-CM | POA: Insufficient documentation

## 2016-05-07 DIAGNOSIS — Z5112 Encounter for antineoplastic immunotherapy: Secondary | ICD-10-CM | POA: Diagnosis not present

## 2016-05-07 DIAGNOSIS — Z5111 Encounter for antineoplastic chemotherapy: Secondary | ICD-10-CM

## 2016-05-07 DIAGNOSIS — C3491 Malignant neoplasm of unspecified part of right bronchus or lung: Secondary | ICD-10-CM

## 2016-05-07 DIAGNOSIS — D6481 Anemia due to antineoplastic chemotherapy: Secondary | ICD-10-CM | POA: Insufficient documentation

## 2016-05-07 DIAGNOSIS — I700 Atherosclerosis of aorta: Secondary | ICD-10-CM | POA: Insufficient documentation

## 2016-05-07 DIAGNOSIS — J3489 Other specified disorders of nose and nasal sinuses: Secondary | ICD-10-CM | POA: Diagnosis not present

## 2016-05-07 DIAGNOSIS — J942 Hemothorax: Secondary | ICD-10-CM | POA: Diagnosis not present

## 2016-05-07 LAB — CBC WITH DIFFERENTIAL/PLATELET
BASO%: 0.2 % (ref 0.0–2.0)
BASOS ABS: 0 10*3/uL (ref 0.0–0.1)
EOS ABS: 0 10*3/uL (ref 0.0–0.5)
EOS%: 0.1 % (ref 0.0–7.0)
HCT: 32.5 % — ABNORMAL LOW (ref 38.4–49.9)
HGB: 10.8 g/dL — ABNORMAL LOW (ref 13.0–17.1)
LYMPH%: 13.7 % — AB (ref 14.0–49.0)
MCH: 30.1 pg (ref 27.2–33.4)
MCHC: 33.3 g/dL (ref 32.0–36.0)
MCV: 90.6 fL (ref 79.3–98.0)
MONO#: 0.8 10*3/uL (ref 0.1–0.9)
MONO%: 14.8 % — AB (ref 0.0–14.0)
NEUT%: 71.2 % (ref 39.0–75.0)
NEUTROS ABS: 3.7 10*3/uL (ref 1.5–6.5)
PLATELETS: 204 10*3/uL (ref 140–400)
RBC: 3.59 10*6/uL — AB (ref 4.20–5.82)
RDW: 23.6 % — ABNORMAL HIGH (ref 11.0–14.6)
WBC: 5.2 10*3/uL (ref 4.0–10.3)
lymph#: 0.7 10*3/uL — ABNORMAL LOW (ref 0.9–3.3)

## 2016-05-07 LAB — COMPREHENSIVE METABOLIC PANEL
ALBUMIN: 3.4 g/dL — AB (ref 3.5–5.0)
ALK PHOS: 101 U/L (ref 40–150)
ALT: 14 U/L (ref 0–55)
AST: 23 U/L (ref 5–34)
Anion Gap: 12 mEq/L — ABNORMAL HIGH (ref 3–11)
BILIRUBIN TOTAL: 0.5 mg/dL (ref 0.20–1.20)
BUN: 9.3 mg/dL (ref 7.0–26.0)
CALCIUM: 9.4 mg/dL (ref 8.4–10.4)
CO2: 26 mEq/L (ref 22–29)
CREATININE: 1.1 mg/dL (ref 0.7–1.3)
Chloride: 100 mEq/L (ref 98–109)
EGFR: 75 mL/min/{1.73_m2} — ABNORMAL LOW (ref >90)
GLUCOSE: 139 mg/dL (ref 70–140)
Potassium: 4 mEq/L (ref 3.5–5.1)
SODIUM: 137 meq/L (ref 136–145)
TOTAL PROTEIN: 7.3 g/dL (ref 6.4–8.3)

## 2016-05-07 LAB — UA PROTEIN, DIPSTICK - CHCC: PROTEIN: NEGATIVE mg/dL

## 2016-05-07 MED ORDER — PALONOSETRON HCL INJECTION 0.25 MG/5ML
0.2500 mg | Freq: Once | INTRAVENOUS | Status: AC
  Administered 2016-05-07: 0.25 mg via INTRAVENOUS

## 2016-05-07 MED ORDER — SODIUM CHLORIDE 0.9% FLUSH
10.0000 mL | INTRAVENOUS | Status: DC | PRN
  Administered 2016-05-07: 10 mL
  Filled 2016-05-07: qty 10

## 2016-05-07 MED ORDER — SODIUM CHLORIDE 0.9 % IV SOLN
511.0000 mg | Freq: Once | INTRAVENOUS | Status: AC
  Administered 2016-05-07: 510 mg via INTRAVENOUS
  Filled 2016-05-07: qty 51

## 2016-05-07 MED ORDER — IOPAMIDOL (ISOVUE-300) INJECTION 61%
100.0000 mL | Freq: Once | INTRAVENOUS | Status: AC | PRN
  Administered 2016-05-07: 100 mL via INTRAVENOUS

## 2016-05-07 MED ORDER — SODIUM CHLORIDE 0.9 % IV SOLN
520.0000 mg/m2 | Freq: Once | INTRAVENOUS | Status: AC
  Administered 2016-05-07: 1000 mg via INTRAVENOUS
  Filled 2016-05-07: qty 40

## 2016-05-07 MED ORDER — DEXAMETHASONE SODIUM PHOSPHATE 10 MG/ML IJ SOLN
10.0000 mg | Freq: Once | INTRAMUSCULAR | Status: AC
  Administered 2016-05-07: 10 mg via INTRAVENOUS

## 2016-05-07 MED ORDER — HEPARIN SOD (PORK) LOCK FLUSH 100 UNIT/ML IV SOLN
500.0000 [IU] | Freq: Once | INTRAVENOUS | Status: AC | PRN
  Administered 2016-05-07: 500 [IU]
  Filled 2016-05-07: qty 5

## 2016-05-07 MED ORDER — PALONOSETRON HCL INJECTION 0.25 MG/5ML
INTRAVENOUS | Status: AC
  Filled 2016-05-07: qty 5

## 2016-05-07 MED ORDER — SODIUM CHLORIDE 0.9 % IV SOLN
15.0000 mg/kg | Freq: Once | INTRAVENOUS | Status: AC
  Administered 2016-05-07: 1100 mg via INTRAVENOUS
  Filled 2016-05-07: qty 32

## 2016-05-07 MED ORDER — DEXAMETHASONE SODIUM PHOSPHATE 10 MG/ML IJ SOLN
INTRAMUSCULAR | Status: AC
  Filled 2016-05-07: qty 1

## 2016-05-07 MED ORDER — SODIUM CHLORIDE 0.9 % IV SOLN
Freq: Once | INTRAVENOUS | Status: AC
  Administered 2016-05-07: 12:00:00 via INTRAVENOUS

## 2016-05-07 NOTE — Patient Instructions (Signed)
[facility] Discharge Instructions for Patients Receiving Chemotherapy  Today you received the following chemotherapy agents Alimta Carboplatin and Avastin  To help prevent nausea and vomiting after your treatment, we encourage you to take your nausea medication as directed. Take Compazine for nausea.    If you develop nausea and vomiting that is not controlled by your nausea medication, call the clinic.   BELOW ARE SYMPTOMS THAT SHOULD BE REPORTED IMMEDIATELY:  *FEVER GREATER THAN 100.5 F  *CHILLS WITH OR WITHOUT FEVER  NAUSEA AND VOMITING THAT IS NOT CONTROLLED WITH YOUR NAUSEA MEDICATION  *UNUSUAL SHORTNESS OF BREATH  *UNUSUAL BRUISING OR BLEEDING  TENDERNESS IN MOUTH AND THROAT WITH OR WITHOUT PRESENCE OF ULCERS  *URINARY PROBLEMS  *BOWEL PROBLEMS  UNUSUAL RASH Items with * indicate a potential emergency and should be followed up as soon as possible.  Feel free to call the clinic you have any questions or concerns. The clinic phone number is [phone].  Please show the [facility] at check-in to the Emergency Department and triage nurse.   Pemetrexed injection What is this medicine? PEMETREXED (PEM e TREX ed) is a chemotherapy drug. This medicine affects cells that are rapidly growing, such as cancer cells and cells in your mouth and stomach. It is usually used to treat lung cancers like non-small cell lung cancer and mesothelioma. It may also be used to treat other cancers. This medicine may be used for other purposes; ask your health care provider or pharmacist if you have questions. What should I tell my health care provider before I take this medicine? They need to know if you have any of these conditions: -if you frequently drink alcohol containing beverages -infection (especially a virus infection such as chickenpox, cold sores, or herpes) -kidney disease -liver disease -low blood counts, like low platelets, red bloods, or white blood  cells -an unusual or allergic reaction to pemetrexed, mannitol, other medicines, foods, dyes, or preservatives -pregnant or trying to get pregnant -breast-feeding How should I use this medicine? This drug is given as an infusion into a vein. It is administered in a hospital or clinic by a specially trained health care professional. Talk to your pediatrician regarding the use of this medicine in children. Special care may be needed. Overdosage: If you think you have taken too much of this medicine contact a poison control center or emergency room at once. NOTE: This medicine is only for you. Do not share this medicine with others. What if I miss a dose? It is important not to miss your dose. Call your doctor or health care professional if you are unable to keep an appointment. What may interact with this medicine? -aspirin and aspirin-like medicines -medicines to increase blood counts like filgrastim, pegfilgrastim, sargramostim -methotrexate -NSAIDS, medicines for pain and inflammation, like ibuprofen or naproxen -probenecid -pyrimethamine -vaccines Talk to your doctor or health care professional before taking any of these medicines: -acetaminophen -aspirin -ibuprofen -ketoprofen -naproxen This list may not describe all possible interactions. Give your health care provider a list of all the medicines, herbs, non-prescription drugs, or dietary supplements you use. Also tell them if you smoke, drink alcohol, or use illegal drugs. Some items may interact with your medicine. What should I watch for while using this medicine? Visit your doctor for checks on your progress. This drug may make you feel generally unwell. This is not uncommon, as chemotherapy can affect healthy cells as well as cancer cells. Report any side effects. Continue your  course of treatment even though you feel ill unless your doctor tells you to stop. In some cases, you may be given additional medicines to help with side  effects. Follow all directions for their use. Call your doctor or health care professional for advice if you get a fever, chills or sore throat, or other symptoms of a cold or flu. Do not treat yourself. This drug decreases your body's ability to fight infections. Try to avoid being around people who are sick. This medicine may increase your risk to bruise or bleed. Call your doctor or health care professional if you notice any unusual bleeding. Be careful brushing and flossing your teeth or using a toothpick because you may get an infection or bleed more easily. If you have any dental work done, tell your dentist you are receiving this medicine. Avoid taking products that contain aspirin, acetaminophen, ibuprofen, naproxen, or ketoprofen unless instructed by your doctor. These medicines may hide a fever. Call your doctor or health care professional if you get diarrhea or mouth sores. Do not treat yourself. To protect your kidneys, drink water or other fluids as directed while you are taking this medicine. Men and women must use effective birth control while taking this medicine. You may also need to continue using effective birth control for a time after stopping this medicine. Do not become pregnant while taking this medicine. Tell your doctor right away if you think that you or your partner might be pregnant. There is a potential for serious side effects to an unborn child. Talk to your health care professional or pharmacist for more information. Do not breast-feed an infant while taking this medicine. This medicine may lower sperm counts. What side effects may I notice from receiving this medicine? Side effects that you should report to your doctor or health care professional as soon as possible: -allergic reactions like skin rash, itching or hives, swelling of the face, lips, or tongue -low blood counts - this medicine may decrease the number of white blood cells, red blood cells and platelets. You  may be at increased risk for infections and bleeding. -signs of infection - fever or chills, cough, sore throat, pain or difficulty passing urine -signs of decreased platelets or bleeding - bruising, pinpoint red spots on the skin, black, tarry stools, blood in the urine -signs of decreased red blood cells - unusually weak or tired, fainting spells, lightheadedness -breathing problems, like a dry cough -changes in emotions or moods -chest pain -confusion -diarrhea -high blood pressure -mouth or throat sores or ulcers -pain, swelling, warmth in the leg -pain on swallowing -swelling of the ankles, feet, hands -trouble passing urine or change in the amount of urine -vomiting -yellowing of the eyes or skin Side effects that usually do not require medical attention (report to your doctor or health care professional if they continue or are bothersome): -hair loss -loss of appetite -nausea -stomach upset This list may not describe all possible side effects. Call your doctor for medical advice about side effects. You may report side effects to FDA at 1-800-FDA-1088. Where should I keep my medicine? This drug is given in a hospital or clinic and will not be stored at home. NOTE: This sheet is a summary. It may not cover all possible information. If you have questions about this medicine, talk to your doctor, pharmacist, or health care provider.    2016, [employer]. (2007-12-21 13:24:03)  Carboplatin injection What is this medicine? CARBOPLATIN (KAR boe pla tin) is a  chemotherapy drug. It targets fast dividing cells, like cancer cells, and causes these cells to die. This medicine is used to treat ovarian cancer and many other cancers. This medicine may be used for other purposes; ask your health care provider or pharmacist if you have questions. What should I tell my health care provider before I take this medicine? They need to know if you have any of these conditions: -blood  disorders -hearing problems -kidney disease -recent or ongoing radiation therapy -an unusual or allergic reaction to carboplatin, cisplatin, other chemotherapy, other medicines, foods, dyes, or preservatives -pregnant or trying to get pregnant -breast-feeding How should I use this medicine? This drug is usually given as an infusion into a vein. It is administered in a hospital or clinic by a specially trained health care professional. Talk to your pediatrician regarding the use of this medicine in children. Special care may be needed. Overdosage: If you think you have taken too much of this medicine contact a poison control center or emergency room at once. NOTE: This medicine is only for you. Do not share this medicine with others. What if I miss a dose? It is important not to miss a dose. Call your doctor or health care professional if you are unable to keep an appointment. What may interact with this medicine? -medicines for seizures -medicines to increase blood counts like filgrastim, pegfilgrastim, sargramostim -some antibiotics like amikacin, gentamicin, neomycin, streptomycin, tobramycin -vaccines Talk to your doctor or health care professional before taking any of these medicines: -acetaminophen -aspirin -ibuprofen -ketoprofen -naproxen This list may not describe all possible interactions. Give your health care provider a list of all the medicines, herbs, non-prescription drugs, or dietary supplements you use. Also tell them if you smoke, drink alcohol, or use illegal drugs. Some items may interact with your medicine. What should I watch for while using this medicine? Your condition will be monitored carefully while you are receiving this medicine. You will need important blood work done while you are taking this medicine. This drug may make you feel generally unwell. This is not uncommon, as chemotherapy can affect healthy cells as well as cancer cells. Report any side effects.  Continue your course of treatment even though you feel ill unless your doctor tells you to stop. In some cases, you may be given additional medicines to help with side effects. Follow all directions for their use. Call your doctor or health care professional for advice if you get a fever, chills or sore throat, or other symptoms of a cold or flu. Do not treat yourself. This drug decreases your body's ability to fight infections. Try to avoid being around people who are sick. This medicine may increase your risk to bruise or bleed. Call your doctor or health care professional if you notice any unusual bleeding. Be careful brushing and flossing your teeth or using a toothpick because you may get an infection or bleed more easily. If you have any dental work done, tell your dentist you are receiving this medicine. Avoid taking products that contain aspirin, acetaminophen, ibuprofen, naproxen, or ketoprofen unless instructed by your doctor. These medicines may hide a fever. Do not become pregnant while taking this medicine. Women should inform their doctor if they wish to become pregnant or think they might be pregnant. There is a potential for serious side effects to an unborn child. Talk to your health care professional or pharmacist for more information. Do not breast-feed an infant while taking this medicine. What side effects  may I notice from receiving this medicine? Side effects that you should report to your doctor or health care professional as soon as possible: -allergic reactions like skin rash, itching or hives, swelling of the face, lips, or tongue -signs of infection - fever or chills, cough, sore throat, pain or difficulty passing urine -signs of decreased platelets or bleeding - bruising, pinpoint red spots on the skin, black, tarry stools, nosebleeds -signs of decreased red blood cells - unusually weak or tired, fainting spells, lightheadedness -breathing problems -changes in  hearing -changes in vision -chest pain -high blood pressure -low blood counts - This drug may decrease the number of white blood cells, red blood cells and platelets. You may be at increased risk for infections and bleeding. -nausea and vomiting -pain, swelling, redness or irritation at the injection site -pain, tingling, numbness in the hands or feet -problems with balance, talking, walking -trouble passing urine or change in the amount of urine Side effects that usually do not require medical attention (report to your doctor or health care professional if they continue or are bothersome): -hair loss -loss of appetite -metallic taste in the mouth or changes in taste This list may not describe all possible side effects. Call your doctor for medical advice about side effects. You may report side effects to FDA at 1-800-FDA-1088. Where should I keep my medicine? This drug is given in a hospital or clinic and will not be stored at home. NOTE: This sheet is a summary. It may not cover all possible information. If you have questions about this medicine, talk to your doctor, pharmacist, or health care provider.    2016, [employer]. (2007-08-24 14:38:05)

## 2016-05-07 NOTE — Telephone Encounter (Signed)
Per [facility] I have scheduled appts and notified the scheduler 

## 2016-05-07 NOTE — Progress Notes (Signed)
[place] [phone]   [phone]  OFFICE PROGRESS NOTE  No PCP Per Patient No address on file  DIAGNOSIS: Stage IV (T3, N0, M1a) non-small cell lung cancer, adenocarcinoma presented with large right lower lobe lung mass, large right pleural effusion with collapse of the right lung and shift of the mediastinum and heart to the left as well as left lung pulmonary nodules diagnosed in August 2017.  FOUND[facility]: Molecular study:  Genomic Alteration Identified? KRAS G12C Additional Findings? Microsatellite status Cannot Be Determined Tumor Mutation Burden Cannot Be Determined Additional Disease-relevant Genes with No Reportable Alterations Identified? EGFR ALK BRAF MET RET ERBB2 ROS1  PRIOR THERAPY: None  CURRENT THERAPY: Systemic chemotherapy was carboplatin for AUC of 5, Alimta 500 MG/M2 and Avastin 15 MG/KG every 3 weeks. Status post 3 cycles. First cycle was given 03/02/2016.  INTERVAL HISTORY: Roger Barnes 55 y.o. male returns to the clinic today for follow-up visit. The patient is currently undergoing systemic chemotherapy with carboplatin, Alimta and Avastin status post 3 cycles. He is tolerating his treatment well with no significant adverse effects. He is complaining of runny nose on and off as well as constipation and he is currently on Senokot. He also has persistent fatigue and shortness breath with exertion. He continues to have drainage of the right pleural effusion but down to 25 ML 2 times a week. He will start around 2 pounds since his last visit. He denied having any recent fever or chills. He has no nausea or vomiting. He has no headache or visual changes. He had repeat CT scan of the chest, abdomen and pelvis performed earlier today and he is here for evaluation and discussion of his scan results.  MEDICAL HISTORY: Past Medical History:  Diagnosis Date  . Adenocarcinoma of right lung, stage 4 ([place])   . Encounter  for antineoplastic chemotherapy 02/26/2016  . Pleural effusion, right 01/25/2016  . Tachycardia     ALLERGIES:  is allergic to no known allergies.  MEDICATIONS:  Current Outpatient Prescriptions  Medication Sig Dispense Refill  . acetaminophen (TYLENOL) 325 MG tablet Take 2 tablets (650 mg total) by mouth every 6 (six) hours as needed for mild pain (or Fever >/= 101). 10 tablet 0  . bisacodyl (DULCOLAX) 5 MG EC tablet Take 2 tablets (10 mg total) by mouth daily. 30 tablet 0  . dexamethasone (DECADRON) 4 MG tablet 4 mg by mouth twice a day the day before, day of and day after the chemotherapy every 3 weeks 40 tablet 1  . feeding supplement, ENSURE ENLIVE, (ENSURE ENLIVE) LIQD Take 237 mLs by mouth 2 (two) times daily between meals. 846 mL 12  . folic acid (FOLVITE) 1 MG tablet Take 1 tablet (1 mg total) by mouth daily. 30 tablet 4  . HYDROcodone-acetaminophen (NORCO/VICODIN) 5-325 MG tablet Take 1-2 tablets by mouth every 4 (four) hours as needed for moderate pain. 45 tablet 0  . levalbuterol (XOPENEX) 0.63 MG/3ML nebulizer solution Take 3 mLs (0.63 mg total) by nebulization 2 (two) times daily. 3 mL 12  . lidocaine-prilocaine (EMLA) cream Apply 1 application topically as needed. 30 g 0  . metoprolol tartrate (LOPRESSOR) 25 MG tablet Take 1 tablet (25 mg total) by mouth 2 (two) times daily. 60 tablet 12  . Multiple Vitamins-Minerals (MULTIVITAMIN WITH MINERALS) tablet Take 1 tablet by mouth daily.    . polyethylene glycol (MIRALAX / GLYCOLAX) packet Take 17 g by mouth daily as needed for  mild constipation. 14 each 0  . prochlorperazine (COMPAZINE) 10 MG tablet Take 1 tablet (10 mg total) by mouth every 6 (six) hours as needed for nausea or vomiting. 30 tablet 0  . traMADol (ULTRAM) 50 MG tablet Take 1 tablet (50 mg total) by mouth every 6 (six) hours as needed (mild pain). 30 tablet 1   No current facility-administered medications for this visit.     SURGICAL HISTORY:  Past Surgical  History:  Procedure Laterality Date  . CHEST TUBE INSERTION Right 02/01/2016   Procedure: INSERTION PLEURAL DRAINAGE CATHETER;  Surgeon: Ivin Poot, MD;  Location: [facility];  Service: Thoracic;  Laterality: Right;  . PLEURADESIS Right 02/01/2016   Procedure: DOXYCYCLINE PLEURADESIS;  Surgeon: Ivin Poot, MD;  Location: [facility];  Service: Thoracic;  Laterality: Right;  . PORTACATH PLACEMENT Right 03/06/2016   Procedure: INSERTION PORT-A-CATH;  Surgeon: Ivin Poot, MD;  Location: [facility];  Service: Thoracic;  Laterality: Right;  . THORACENTESIS Right 01/25/2016  . VIDEO ASSISTED THORACOSCOPY Right 02/01/2016   Procedure: VIDEO ASSISTED THORACOSCOPY;  Surgeon: Ivin Poot, MD;  Location: [facility];  Service: Thoracic;  Laterality: Right;  [facility] VIDEO BRONCHOSCOPY N/A 02/01/2016   Procedure: VIDEO BRONCHOSCOPY;  Surgeon: Ivin Poot, MD;  Location: [facility];  Service: Thoracic;  Laterality: N/A;  . WISDOM TOOTH EXTRACTION      REVIEW OF SYSTEMS:  Constitutional: positive for fatigue and weight loss Eyes: negative Ears, nose, mouth, throat, and face: positive for Runny nose Respiratory: positive for cough and dyspnea on exertion Cardiovascular: negative for orthopnea and palpitations Gastrointestinal: negative for diarrhea, nausea and vomiting Genitourinary:negative for dysuria, frequency and hematuria Integument/breast: negative for rash Hematologic/lymphatic: negative for bleeding and easy bruising Musculoskeletal:negative Neurological: negative for dizziness, gait problems and headaches Behavioral/Psych: negative Endocrine: negative Allergic/Immunologic: negative   PHYSICAL EXAMINATION: General appearance: alert, cooperative, fatigued and no distress Head: Normocephalic, without obvious abnormality, atraumatic Neck: no adenopathy, no JVD, supple, symmetrical, trachea midline and thyroid not enlarged, symmetric, no tenderness/mass/nodules Lymph nodes: Cervical, supraclavicular, and  axillary nodes normal. Resp: diminished breath sounds RLL and dullness to percussion RLL Back: symmetric, no curvature. ROM normal. No CVA tenderness. Cardio: regular rate and rhythm, S1, S2 normal, no murmur, click, rub or gallop GI: soft, non-tender; bowel sounds normal; no masses,  no organomegaly Extremities: extremities normal, atraumatic, no cyanosis or edema Neurologic: Alert and oriented X 3, normal strength and tone. Normal symmetric reflexes. Normal coordination and gait  ECOG PERFORMANCE STATUS: 1 - Symptomatic but completely ambulatory  Blood pressure 118/87, pulse (!) 103, temperature 97.8 F (36.6 C), temperature source Oral, resp. rate 18, height 5' 10" (1.778 m), weight 151 lb 6.4 oz (68.7 kg), SpO2 99 %.  LABORATORY DATA: Lab Results  Component Value Date   WBC 5.2 05/07/2016   HGB 10.8 (L) 05/07/2016   HCT 32.5 (L) 05/07/2016   MCV 90.6 05/07/2016   PLT 204 05/07/2016      Chemistry      Component Value Date/Time   NA 137 05/07/2016 1000   K 4.0 05/07/2016 1000   CL 105 03/05/2016 1206   CO2 26 05/07/2016 1000   BUN 9.3 05/07/2016 1000   CREATININE 1.1 05/07/2016 1000      Component Value Date/Time   CALCIUM 9.4 05/07/2016 1000   ALKPHOS 101 05/07/2016 1000   AST 23 05/07/2016 1000   ALT 14 05/07/2016 1000   BILITOT 0.50 05/07/2016 1000       RADIOGRAPHIC STUDIES:  Ct Chest W Contrast  Result Date: 05/07/2016 CLINICAL DATA:  Stage IV (T3, N0, M1a) right lower lobe lung adenocarcinoma diagnosed August 2017 with large right pleural effusion status post PleurX catheter placement, presenting for restaging with ongoing chemotherapy. EXAM: CT CHEST, ABDOMEN, AND PELVIS WITH CONTRAST TECHNIQUE: Multidetector CT imaging of the chest, abdomen and pelvis was performed following the standard protocol during bolus administration of intravenous contrast. CONTRAST:  175m ISOVUE-300 IOPAMIDOL (ISOVUE-300) INJECTION 61% COMPARISON:  02/22/2016 PET-CT. 01/25/2016 chest  CT angiogram and CT abdomen/pelvis. FINDINGS: CT CHEST FINDINGS Cardiovascular: Normal heart size. Stable trace pericardial effusion/thickening. Right subclavian MediPort terminates in the right atrium near the cavoatrial junction. Great vessels are normal in course and caliber. No central pulmonary emboli. Mediastinum/Nodes: No discrete thyroid nodules. Unremarkable esophagus. No pathologically enlarged axillary, mediastinal or hilar lymph nodes. Lungs/Pleura: Moderate to large right hydropneumothorax is overall not significantly changed in size with decreased pleural effusion component. Right PleurX catheter terminates in the medial basilar right pleural space. No left hydronephrosis. No left pleural effusion. Right lower lobe 5.6 x 2.9 cm lung mass (series 4/ image 115), decreased from 7.4 x 3.9 cm on 02/22/2016. There is moderate compressive atelectasis in the posterior right upper lobe, peripheral right middle lobe and right lower lobe, not appreciably changed. There is diffuse thickening of the peribronchovascular interstitium throughout the right lung with mildly nodular interlobular septal thickening throughout the right lung, most prominent in the right upper lobe, which appears overall decreased since 02/22/2016, consistent with decreasing lymphangitic tumor. Confluent poorly marginated peripheral peribronchovascular nodularity throughout the left lower lobe and less prominently involving the left upper lobe is overall mildly increased. For example a 2.3 x 1.2 cm peripheral left lower lobe nodule (series 4/ image 129), previously 1.7 x 1.2 cm on 02/22/2016, mildly increased. A 2.2 x 1.7 cm posterior left lower lobe nodule (series 4/ image 113), previously 1.6 x 1.3 cm, increased, although with areas of interval decreased density centrally. Posterior left lower lobe 1.4 x 1.1 cm nodule (series 4/image 137), previously 1.2 x 0.9 cm, mildly increased. Anterior left upper lobe nodularity measures up to 0.8 cm  (series 4/image 45), not appreciably changed. Musculoskeletal: No aggressive appearing focal osseous lesions. Mild thoracic spondylosis . CT ABDOMEN PELVIS FINDINGS Hepatobiliary: Normal liver with no liver mass. Normal gallbladder with no radiopaque cholelithiasis. No biliary ductal dilatation. Pancreas: Normal, with no mass or duct dilation. Spleen: Normal size. No mass. Adrenals/Urinary Tract: Normal adrenals. Subcentimeter hypodense renal cortical lesion in the posterior interpolar left kidney, too small to characterize, stable, suggesting a benign renal cyst. Otherwise normal kidneys with no hydronephrosis and no new renal lesions. Collapsed and grossly normal bladder. Stomach/Bowel: Grossly normal stomach. Normal caliber small bowel with no small bowel wall thickening. Normal appendix . Normal large bowel with no diverticulosis, large bowel wall thickening or pericolonic fat stranding. Vascular/Lymphatic: Atherosclerotic nonaneurysmal abdominal aorta. Patent portal, splenic, hepatic and renal veins. No pathologically enlarged lymph nodes in the abdomen or pelvis. Reproductive: Stable mildly enlarged prostate . Other: No pneumoperitoneum, ascites or focal fluid collection. Musculoskeletal: No aggressive appearing focal osseous lesions. Mild-to-moderate lumbar spondylosis. IMPRESSION: 1. Primary right lower lobe tumor is decreased in size. 2. Findings of lymphangitic tumor throughout the right lung are decreased. 3. Confluent peripheral pulmonary metastases in the left lower lobe are mildly increased. Small pulmonary metastases in the peripheral left upper lobe are stable. 4. Moderate to large right hydropneumothorax, overall stable in size with PleurX catheter in place, with decreased pleural  effusion component. Stable associated compressive atelectasis in the right lung. 5. No new sites of metastatic disease. No metastatic disease in the abdomen, pelvis or skeleton. 6. Aortic atherosclerosis. Electronically  Signed   By: Ilona Sorrel M.D.   On: 05/07/2016 12:23   Ct Abdomen Pelvis W Contrast  Result Date: 05/07/2016 CLINICAL DATA:  Stage IV (T3, N0, M1a) right lower lobe lung adenocarcinoma diagnosed August 2017 with large right pleural effusion status post PleurX catheter placement, presenting for restaging with ongoing chemotherapy. EXAM: CT CHEST, ABDOMEN, AND PELVIS WITH CONTRAST TECHNIQUE: Multidetector CT imaging of the chest, abdomen and pelvis was performed following the standard protocol during bolus administration of intravenous contrast. CONTRAST:  176m ISOVUE-300 IOPAMIDOL (ISOVUE-300) INJECTION 61% COMPARISON:  02/22/2016 PET-CT. 01/25/2016 chest CT angiogram and CT abdomen/pelvis. FINDINGS: CT CHEST FINDINGS Cardiovascular: Normal heart size. Stable trace pericardial effusion/thickening. Right subclavian MediPort terminates in the right atrium near the cavoatrial junction. Great vessels are normal in course and caliber. No central pulmonary emboli. Mediastinum/Nodes: No discrete thyroid nodules. Unremarkable esophagus. No pathologically enlarged axillary, mediastinal or hilar lymph nodes. Lungs/Pleura: Moderate to large right hydropneumothorax is overall not significantly changed in size with decreased pleural effusion component. Right PleurX catheter terminates in the medial basilar right pleural space. No left hydronephrosis. No left pleural effusion. Right lower lobe 5.6 x 2.9 cm lung mass (series 4/ image 115), decreased from 7.4 x 3.9 cm on 02/22/2016. There is moderate compressive atelectasis in the posterior right upper lobe, peripheral right middle lobe and right lower lobe, not appreciably changed. There is diffuse thickening of the peribronchovascular interstitium throughout the right lung with mildly nodular interlobular septal thickening throughout the right lung, most prominent in the right upper lobe, which appears overall decreased since 02/22/2016, consistent with decreasing  lymphangitic tumor. Confluent poorly marginated peripheral peribronchovascular nodularity throughout the left lower lobe and less prominently involving the left upper lobe is overall mildly increased. For example a 2.3 x 1.2 cm peripheral left lower lobe nodule (series 4/ image 129), previously 1.7 x 1.2 cm on 02/22/2016, mildly increased. A 2.2 x 1.7 cm posterior left lower lobe nodule (series 4/ image 113), previously 1.6 x 1.3 cm, increased, although with areas of interval decreased density centrally. Posterior left lower lobe 1.4 x 1.1 cm nodule (series 4/image 137), previously 1.2 x 0.9 cm, mildly increased. Anterior left upper lobe nodularity measures up to 0.8 cm (series 4/image 45), not appreciably changed. Musculoskeletal: No aggressive appearing focal osseous lesions. Mild thoracic spondylosis . CT ABDOMEN PELVIS FINDINGS Hepatobiliary: Normal liver with no liver mass. Normal gallbladder with no radiopaque cholelithiasis. No biliary ductal dilatation. Pancreas: Normal, with no mass or duct dilation. Spleen: Normal size. No mass. Adrenals/Urinary Tract: Normal adrenals. Subcentimeter hypodense renal cortical lesion in the posterior interpolar left kidney, too small to characterize, stable, suggesting a benign renal cyst. Otherwise normal kidneys with no hydronephrosis and no new renal lesions. Collapsed and grossly normal bladder. Stomach/Bowel: Grossly normal stomach. Normal caliber small bowel with no small bowel wall thickening. Normal appendix . Normal large bowel with no diverticulosis, large bowel wall thickening or pericolonic fat stranding. Vascular/Lymphatic: Atherosclerotic nonaneurysmal abdominal aorta. Patent portal, splenic, hepatic and renal veins. No pathologically enlarged lymph nodes in the abdomen or pelvis. Reproductive: Stable mildly enlarged prostate . Other: No pneumoperitoneum, ascites or focal fluid collection. Musculoskeletal: No aggressive appearing focal osseous lesions.  Mild-to-moderate lumbar spondylosis. IMPRESSION: 1. Primary right lower lobe tumor is decreased in size. 2. Findings of lymphangitic tumor throughout  the right lung are decreased. 3. Confluent peripheral pulmonary metastases in the left lower lobe are mildly increased. Small pulmonary metastases in the peripheral left upper lobe are stable. 4. Moderate to large right hydropneumothorax, overall stable in size with PleurX catheter in place, with decreased pleural effusion component. Stable associated compressive atelectasis in the right lung. 5. No new sites of metastatic disease. No metastatic disease in the abdomen, pelvis or skeleton. 6. Aortic atherosclerosis. Electronically Signed   By: Ilona Sorrel M.D.   On: 05/07/2016 12:23    ASSESSMENT AND PLAN: This is a very pleasant 55 years old white male with: 1) stage IV non-small cell lung cancer, adenocarcinoma currently undergoing systemic chemotherapy with carboplatin, Alimta and Avastin status post 3 cycles. The patient is tolerating the treatment well with no significant adverse effects. The patient had CT scan of the chest, abdomen and pelvis performed earlier today but the final report is still pending. I personally and independently reviewed the scan images and discuss it with the patient. Per my assessment, there are some evidence for disease improvement in the right lung with decrease in the pulmonary nodules. I recommended for the patient to continue his systemic chemotherapy with carboplatin, Alimta and Avastin and he will proceed with cycle #4 today. He would come back for follow-up visit in 3 weeks with the start of the next cycle of his chemotherapy. 2) right pleural effusion: The drainage via the Pleurx catheter is decreasing. I recommended for the patient to reconsult with Dr. Prescott Gum for consideration of removal of the Pleurx catheter if indicated. 3) chemotherapy-induced anemia: I will continue to monitor his hemoglobin and hematocrit  closely during his treatment and consider the patient for transfusion in the future if needed. 4) running nose: This is most likely secondary to his current treatment with carboplatin and Alimta. I recommended for the patient to use over-the-counter allergy medication like Claritin. We will continue to monitor this during his upcoming visits. The patient was advised to call immediately if he has any concerning symptoms in the interval. The patient voices understanding of current disease status and treatment options and is in agreement with the current care plan.  All questions were answered. The patient knows to call the clinic with any problems, questions or concerns. We can certainly see the patient much sooner if necessary.  Disclaimer: This note was dictated with voice recognition software. Similar sounding words can inadvertently be transcribed and may not be corrected upon review.

## 2016-05-07 NOTE — Telephone Encounter (Signed)
Appointments scheduled per 12/6 LOS. Patient had to leave during scheduling. Will pick up calendars and report in infusion room.

## 2016-05-09 ENCOUNTER — Ambulatory Visit (HOSPITAL_COMMUNITY): Payer: Medicare HMO

## 2016-05-14 ENCOUNTER — Other Ambulatory Visit (HOSPITAL_BASED_OUTPATIENT_CLINIC_OR_DEPARTMENT_OTHER): Payer: Medicaid Other

## 2016-05-14 DIAGNOSIS — C3491 Malignant neoplasm of unspecified part of right bronchus or lung: Secondary | ICD-10-CM

## 2016-05-14 DIAGNOSIS — C3431 Malignant neoplasm of lower lobe, right bronchus or lung: Secondary | ICD-10-CM | POA: Diagnosis not present

## 2016-05-14 DIAGNOSIS — J90 Pleural effusion, not elsewhere classified: Secondary | ICD-10-CM

## 2016-05-14 LAB — CBC WITH DIFFERENTIAL/PLATELET
BASO%: 0.4 % (ref 0.0–2.0)
Basophils Absolute: 0 10*3/uL (ref 0.0–0.1)
EOS%: 0.6 % (ref 0.0–7.0)
Eosinophils Absolute: 0 10*3/uL (ref 0.0–0.5)
HEMATOCRIT: 31.8 % — AB (ref 38.4–49.9)
HGB: 10.3 g/dL — ABNORMAL LOW (ref 13.0–17.1)
LYMPH#: 0.5 10*3/uL — AB (ref 0.9–3.3)
LYMPH%: 23.3 % (ref 14.0–49.0)
MCH: 29.9 pg (ref 27.2–33.4)
MCHC: 32.5 g/dL (ref 32.0–36.0)
MCV: 92.1 fL (ref 79.3–98.0)
MONO#: 0.2 10*3/uL (ref 0.1–0.9)
MONO%: 10.7 % (ref 0.0–14.0)
NEUT#: 1.5 10*3/uL (ref 1.5–6.5)
NEUT%: 65 % (ref 39.0–75.0)
Platelets: 238 10*3/uL (ref 140–400)
RBC: 3.45 10*6/uL — ABNORMAL LOW (ref 4.20–5.82)
RDW: 23.8 % — AB (ref 11.0–14.6)
WBC: 2.3 10*3/uL — ABNORMAL LOW (ref 4.0–10.3)

## 2016-05-14 LAB — COMPREHENSIVE METABOLIC PANEL
ALT: 17 U/L (ref 0–55)
AST: 27 U/L (ref 5–34)
Albumin: 3.1 g/dL — ABNORMAL LOW (ref 3.5–5.0)
Alkaline Phosphatase: 96 U/L (ref 40–150)
Anion Gap: 8 mEq/L (ref 3–11)
BUN: 16.4 mg/dL (ref 7.0–26.0)
CALCIUM: 9.1 mg/dL (ref 8.4–10.4)
CHLORIDE: 105 meq/L (ref 98–109)
CO2: 26 meq/L (ref 22–29)
CREATININE: 1 mg/dL (ref 0.7–1.3)
EGFR: 87 mL/min/{1.73_m2} — ABNORMAL LOW (ref >90)
GLUCOSE: 107 mg/dL (ref 70–140)
POTASSIUM: 4.3 meq/L (ref 3.5–5.1)
SODIUM: 139 meq/L (ref 136–145)
Total Bilirubin: 0.71 mg/dL (ref 0.20–1.20)
Total Protein: 6.9 g/dL (ref 6.4–8.3)

## 2016-05-21 ENCOUNTER — Other Ambulatory Visit: Payer: Self-pay | Admitting: Medical Oncology

## 2016-05-21 ENCOUNTER — Other Ambulatory Visit (HOSPITAL_BASED_OUTPATIENT_CLINIC_OR_DEPARTMENT_OTHER): Payer: Medicaid Other

## 2016-05-21 DIAGNOSIS — C3431 Malignant neoplasm of lower lobe, right bronchus or lung: Secondary | ICD-10-CM | POA: Diagnosis present

## 2016-05-21 DIAGNOSIS — C3491 Malignant neoplasm of unspecified part of right bronchus or lung: Secondary | ICD-10-CM

## 2016-05-21 DIAGNOSIS — J90 Pleural effusion, not elsewhere classified: Secondary | ICD-10-CM

## 2016-05-21 LAB — COMPREHENSIVE METABOLIC PANEL
ALT: 15 U/L (ref 0–55)
AST: 27 U/L (ref 5–34)
Albumin: 3.4 g/dL — ABNORMAL LOW (ref 3.5–5.0)
Alkaline Phosphatase: 100 U/L (ref 40–150)
Anion Gap: 9 mEq/L (ref 3–11)
BUN: 8.5 mg/dL (ref 7.0–26.0)
CO2: 26 mEq/L (ref 22–29)
Calcium: 9.1 mg/dL (ref 8.4–10.4)
Chloride: 107 mEq/L (ref 98–109)
Creatinine: 1 mg/dL (ref 0.7–1.3)
EGFR: 80 mL/min/{1.73_m2} — ABNORMAL LOW (ref >90)
Glucose: 107 mg/dl (ref 70–140)
Potassium: 4 mEq/L (ref 3.5–5.1)
Sodium: 142 mEq/L (ref 136–145)
Total Bilirubin: 0.42 mg/dL (ref 0.20–1.20)
Total Protein: 6.9 g/dL (ref 6.4–8.3)

## 2016-05-21 LAB — CBC WITH DIFFERENTIAL/PLATELET
BASO%: 0.2 % (ref 0.0–2.0)
Basophils Absolute: 0 10*3/uL (ref 0.0–0.1)
EOS%: 0.4 % (ref 0.0–7.0)
Eosinophils Absolute: 0 10*3/uL (ref 0.0–0.5)
HCT: 28.3 % — ABNORMAL LOW (ref 38.4–49.9)
HGB: 9.1 g/dL — ABNORMAL LOW (ref 13.0–17.1)
LYMPH%: 16.8 % (ref 14.0–49.0)
MCH: 29.9 pg (ref 27.2–33.4)
MCHC: 32.2 g/dL (ref 32.0–36.0)
MCV: 92.9 fL (ref 79.3–98.0)
MONO#: 0.3 10*3/uL (ref 0.1–0.9)
MONO%: 9.8 % (ref 0.0–14.0)
NEUT#: 2.5 10*3/uL (ref 1.5–6.5)
NEUT%: 72.8 % (ref 39.0–75.0)
Platelets: 57 10*3/uL — ABNORMAL LOW (ref 140–400)
RBC: 3.05 10*6/uL — ABNORMAL LOW (ref 4.20–5.82)
RDW: 23.7 % — ABNORMAL HIGH (ref 11.0–14.6)
WBC: 3.4 10*3/uL — ABNORMAL LOW (ref 4.0–10.3)
lymph#: 0.6 10*3/uL — ABNORMAL LOW (ref 0.9–3.3)

## 2016-05-22 ENCOUNTER — Telehealth: Payer: Self-pay | Admitting: *Deleted

## 2016-05-22 NOTE — Telephone Encounter (Signed)
Roger Barnes had a right pleurX catheter placed during thoracic surgery for right malignant pleural effusion on 02/01/16 by Dr. Prescott Gum. He is getting chemo per Dr. Julien Nordmann and the drainage has significantly decreased. He was wanting to order 500cc bottles because of that, but he has a case of 1000cc bottles. He is only getting 50-100cc whenever he drains which is sporadic, maybe every 2 to 7 days. I told him to reduce his drainage to once weekly unless he becomes short of breath, then we will see him in about a month with a CXR and consider removal if his amounts continue to stay below 100cc weekly. He agrees with the plan.

## 2016-05-28 ENCOUNTER — Other Ambulatory Visit: Payer: Self-pay | Admitting: Internal Medicine

## 2016-05-28 ENCOUNTER — Ambulatory Visit (HOSPITAL_BASED_OUTPATIENT_CLINIC_OR_DEPARTMENT_OTHER): Payer: Medicaid Other | Admitting: Internal Medicine

## 2016-05-28 ENCOUNTER — Encounter: Payer: Self-pay | Admitting: Internal Medicine

## 2016-05-28 ENCOUNTER — Ambulatory Visit (HOSPITAL_BASED_OUTPATIENT_CLINIC_OR_DEPARTMENT_OTHER): Payer: Medicaid Other

## 2016-05-28 ENCOUNTER — Other Ambulatory Visit (HOSPITAL_BASED_OUTPATIENT_CLINIC_OR_DEPARTMENT_OTHER): Payer: Medicaid Other

## 2016-05-28 VITALS — BP 116/83 | HR 94 | Temp 97.8°F | Resp 18 | Ht 70.0 in | Wt 148.6 lb

## 2016-05-28 DIAGNOSIS — C3431 Malignant neoplasm of lower lobe, right bronchus or lung: Secondary | ICD-10-CM | POA: Diagnosis present

## 2016-05-28 DIAGNOSIS — C3491 Malignant neoplasm of unspecified part of right bronchus or lung: Secondary | ICD-10-CM

## 2016-05-28 DIAGNOSIS — J90 Pleural effusion, not elsewhere classified: Secondary | ICD-10-CM

## 2016-05-28 DIAGNOSIS — D6481 Anemia due to antineoplastic chemotherapy: Secondary | ICD-10-CM | POA: Diagnosis not present

## 2016-05-28 DIAGNOSIS — Z5111 Encounter for antineoplastic chemotherapy: Secondary | ICD-10-CM | POA: Diagnosis present

## 2016-05-28 DIAGNOSIS — T451X5A Adverse effect of antineoplastic and immunosuppressive drugs, initial encounter: Secondary | ICD-10-CM

## 2016-05-28 LAB — COMPREHENSIVE METABOLIC PANEL
ALBUMIN: 3.5 g/dL (ref 3.5–5.0)
ALK PHOS: 106 U/L (ref 40–150)
ALT: 10 U/L (ref 0–55)
AST: 22 U/L (ref 5–34)
Anion Gap: 10 mEq/L (ref 3–11)
BILIRUBIN TOTAL: 0.41 mg/dL (ref 0.20–1.20)
BUN: 16.4 mg/dL (ref 7.0–26.0)
CALCIUM: 9.6 mg/dL (ref 8.4–10.4)
CO2: 26 mEq/L (ref 22–29)
CREATININE: 1.5 mg/dL — AB (ref 0.7–1.3)
Chloride: 103 mEq/L (ref 98–109)
EGFR: 52 mL/min/{1.73_m2} — ABNORMAL LOW (ref >90)
Glucose: 141 mg/dl — ABNORMAL HIGH (ref 70–140)
Potassium: 4.4 mEq/L (ref 3.5–5.1)
Sodium: 139 mEq/L (ref 136–145)
TOTAL PROTEIN: 7.3 g/dL (ref 6.4–8.3)

## 2016-05-28 LAB — CBC WITH DIFFERENTIAL/PLATELET
BASO%: 0 % (ref 0.0–2.0)
Basophils Absolute: 0 10*3/uL (ref 0.0–0.1)
EOS%: 0 % (ref 0.0–7.0)
Eosinophils Absolute: 0 10*3/uL (ref 0.0–0.5)
HEMATOCRIT: 28.7 % — AB (ref 38.4–49.9)
HEMOGLOBIN: 9.5 g/dL — AB (ref 13.0–17.1)
LYMPH#: 0.6 10*3/uL — AB (ref 0.9–3.3)
LYMPH%: 15.9 % (ref 14.0–49.0)
MCH: 31.1 pg (ref 27.2–33.4)
MCHC: 33.1 g/dL (ref 32.0–36.0)
MCV: 94.1 fL (ref 79.3–98.0)
MONO#: 0.6 10*3/uL (ref 0.1–0.9)
MONO%: 17.6 % — ABNORMAL HIGH (ref 0.0–14.0)
NEUT#: 2.3 10*3/uL (ref 1.5–6.5)
NEUT%: 66.5 % (ref 39.0–75.0)
PLATELETS: 229 10*3/uL (ref 140–400)
RBC: 3.05 10*6/uL — ABNORMAL LOW (ref 4.20–5.82)
RDW: 22.8 % — AB (ref 11.0–14.6)
WBC: 3.5 10*3/uL — ABNORMAL LOW (ref 4.0–10.3)

## 2016-05-28 LAB — UA PROTEIN, DIPSTICK - CHCC

## 2016-05-28 MED ORDER — SODIUM CHLORIDE 0.9% FLUSH
10.0000 mL | INTRAVENOUS | Status: DC | PRN
  Administered 2016-05-28: 10 mL
  Filled 2016-05-28: qty 10

## 2016-05-28 MED ORDER — DEXAMETHASONE SODIUM PHOSPHATE 10 MG/ML IJ SOLN
10.0000 mg | Freq: Once | INTRAMUSCULAR | Status: AC
  Administered 2016-05-28: 10 mg via INTRAVENOUS

## 2016-05-28 MED ORDER — SODIUM CHLORIDE 0.9 % IV SOLN
410.0000 mg | Freq: Once | INTRAVENOUS | Status: AC
  Administered 2016-05-28: 410 mg via INTRAVENOUS
  Filled 2016-05-28: qty 41

## 2016-05-28 MED ORDER — PALONOSETRON HCL INJECTION 0.25 MG/5ML
0.2500 mg | Freq: Once | INTRAVENOUS | Status: AC
  Administered 2016-05-28: 0.25 mg via INTRAVENOUS

## 2016-05-28 MED ORDER — PALONOSETRON HCL INJECTION 0.25 MG/5ML
INTRAVENOUS | Status: AC
  Filled 2016-05-28: qty 5

## 2016-05-28 MED ORDER — DEXAMETHASONE SODIUM PHOSPHATE 10 MG/ML IJ SOLN
INTRAMUSCULAR | Status: AC
  Filled 2016-05-28: qty 1

## 2016-05-28 MED ORDER — HEPARIN SOD (PORK) LOCK FLUSH 100 UNIT/ML IV SOLN
500.0000 [IU] | Freq: Once | INTRAVENOUS | Status: AC | PRN
  Administered 2016-05-28: 500 [IU]
  Filled 2016-05-28: qty 5

## 2016-05-28 MED ORDER — SODIUM CHLORIDE 0.9 % IV SOLN
518.0000 mg/m2 | Freq: Once | INTRAVENOUS | Status: AC
  Administered 2016-05-28: 1000 mg via INTRAVENOUS
  Filled 2016-05-28: qty 40

## 2016-05-28 MED ORDER — SODIUM CHLORIDE 0.9 % IV SOLN
Freq: Once | INTRAVENOUS | Status: AC
  Administered 2016-05-28: 13:00:00 via INTRAVENOUS

## 2016-05-28 MED ORDER — SODIUM CHLORIDE 0.9 % IV SOLN
15.0000 mg/kg | Freq: Once | INTRAVENOUS | Status: AC
  Administered 2016-05-28: 1100 mg via INTRAVENOUS
  Filled 2016-05-28: qty 32

## 2016-05-28 NOTE — Patient Instructions (Signed)
[place] Discharge Instructions for Patients Receiving Chemotherapy  Today you received the following chemotherapy agents Alimta Carboplatin and Avastin  To help prevent nausea and vomiting after your treatment, we encourage you to take your nausea medication as directed. Take Compazine for nausea.    If you develop nausea and vomiting that is not controlled by your nausea medication, call the clinic.   BELOW ARE SYMPTOMS THAT SHOULD BE REPORTED IMMEDIATELY:  *FEVER GREATER THAN 100.5 F  *CHILLS WITH OR WITHOUT FEVER  NAUSEA AND VOMITING THAT IS NOT CONTROLLED WITH YOUR NAUSEA MEDICATION  *UNUSUAL SHORTNESS OF BREATH  *UNUSUAL BRUISING OR BLEEDING  TENDERNESS IN MOUTH AND THROAT WITH OR WITHOUT PRESENCE OF ULCERS  *URINARY PROBLEMS  *BOWEL PROBLEMS  UNUSUAL RASH Items with * indicate a potential emergency and should be followed up as soon as possible.  Feel free to call the clinic you have any questions or concerns. The clinic phone number is [phone].  Please show the [place] at check-in to the Emergency Department and triage nurse.

## 2016-05-28 NOTE — Progress Notes (Signed)
[place] [phone]   [phone]  OFFICE PROGRESS NOTE  No PCP Per Patient No address on file  DIAGNOSIS: Stage IV (T3, N0, M1a) non-small cell lung cancer, adenocarcinoma presented with large right lower lobe lung mass, large right pleural effusion with collapse of the right lung and shift of the mediastinum and heart to the left as well as left lung pulmonary nodules diagnosed in August 2017.  FOUND[facility]: Molecular study:  Genomic Alteration Identified? KRAS G12C Additional Findings? Microsatellite status Cannot Be Determined Tumor Mutation Burden Cannot Be Determined Additional Disease-relevant Genes with No Reportable Alterations Identified? EGFR ALK BRAF MET RET ERBB2 ROS1  PRIOR THERAPY: None  CURRENT THERAPY: Systemic chemotherapy with carboplatin for AUC of 5, Alimta 500 MG/M2 and Avastin 15 MG/KG every 3 weeks. Status post 4 cycles. First cycle was given 03/02/2016.  INTERVAL HISTORY: Roger Barnes 55 y.o. male came to the clinic today for follow-up visit. The patient is currently undergoing systemic chemotherapy with carboplatin, Alimta and Avastin status post 4 cycles. He tolerated the last cycle of his treatment well with no significant adverse effects. He continues to have minimal drainage from the right Pleurx catheter in now he is doing drainage once weekly. He is followed by Dr. Prescott Gum. He denied having any fever or chills. He has no nausea or vomiting. He has no weight loss or night sweats. He is here today for evaluation before starting cycle #5.  MEDICAL HISTORY: Past Medical History:  Diagnosis Date  . Adenocarcinoma of right lung, stage 4 ([place])   . Encounter for antineoplastic chemotherapy 02/26/2016  . Pleural effusion, right 01/25/2016  . Tachycardia     ALLERGIES:  is allergic to no known allergies.  MEDICATIONS:  Current Outpatient Prescriptions  Medication Sig Dispense Refill  .  acetaminophen (TYLENOL) 325 MG tablet Take 2 tablets (650 mg total) by mouth every 6 (six) hours as needed for mild pain (or Fever >/= 101). 10 tablet 0  . dexamethasone (DECADRON) 4 MG tablet 4 mg by mouth twice a day the day before, day of and day after the chemotherapy every 3 weeks 40 tablet 1  . feeding supplement, ENSURE ENLIVE, (ENSURE ENLIVE) LIQD Take 237 mLs by mouth 2 (two) times daily between meals. 007 mL 12  . folic acid (FOLVITE) 1 MG tablet Take 1 tablet (1 mg total) by mouth daily. 30 tablet 4  . HYDROcodone-acetaminophen (NORCO/VICODIN) 5-325 MG tablet Take 1-2 tablets by mouth every 4 (four) hours as needed for moderate pain. 45 tablet 0  . levalbuterol (XOPENEX) 0.63 MG/3ML nebulizer solution Take 3 mLs (0.63 mg total) by nebulization 2 (two) times daily. 3 mL 12  . lidocaine-prilocaine (EMLA) cream Apply 1 application topically as needed. 30 g 0  . metoprolol tartrate (LOPRESSOR) 25 MG tablet Take 1 tablet (25 mg total) by mouth 2 (two) times daily. 60 tablet 12  . Multiple Vitamins-Minerals (MULTIVITAMIN WITH MINERALS) tablet Take 1 tablet by mouth daily.    . prochlorperazine (COMPAZINE) 10 MG tablet Take 1 tablet (10 mg total) by mouth every 6 (six) hours as needed for nausea or vomiting. 30 tablet 0  . senna (SENOKOT) 8.6 MG tablet Take 1 tablet by mouth daily.    . traMADol (ULTRAM) 50 MG tablet Take 1 tablet (50 mg total) by mouth every 6 (six) hours as needed (mild pain). 30 tablet 1  . bisacodyl (DULCOLAX) 5 MG EC tablet Take 2 tablets (10 mg  total) by mouth daily. (Patient not taking: Reported on 05/28/2016) 30 tablet 0  . polyethylene glycol (MIRALAX / GLYCOLAX) packet Take 17 g by mouth daily as needed for mild constipation. (Patient not taking: Reported on 05/28/2016) 14 each 0   No current facility-administered medications for this visit.     SURGICAL HISTORY:  Past Surgical History:  Procedure Laterality Date  . CHEST TUBE INSERTION Right 02/01/2016   Procedure:  INSERTION PLEURAL DRAINAGE CATHETER;  Surgeon: Ivin Poot, MD;  Location: [place];  Service: Thoracic;  Laterality: Right;  . PLEURADESIS Right 02/01/2016   Procedure: DOXYCYCLINE PLEURADESIS;  Surgeon: Ivin Poot, MD;  Location: [place];  Service: Thoracic;  Laterality: Right;  . PORTACATH PLACEMENT Right 03/06/2016   Procedure: INSERTION PORT-A-CATH;  Surgeon: Ivin Poot, MD;  Location: [place];  Service: Thoracic;  Laterality: Right;  . THORACENTESIS Right 01/25/2016  . VIDEO ASSISTED THORACOSCOPY Right 02/01/2016   Procedure: VIDEO ASSISTED THORACOSCOPY;  Surgeon: Ivin Poot, MD;  Location: [place];  Service: Thoracic;  Laterality: Right;  Marland Kitchen VIDEO BRONCHOSCOPY N/A 02/01/2016   Procedure: VIDEO BRONCHOSCOPY;  Surgeon: Ivin Poot, MD;  Location: [facility];  Service: Thoracic;  Laterality: N/A;  . WISDOM TOOTH EXTRACTION      REVIEW OF SYSTEMS:  A comprehensive review of systems was negative except for: Constitutional: positive for fatigue   PHYSICAL EXAMINATION: General appearance: alert, cooperative, fatigued and no distress Head: Normocephalic, without obvious abnormality, atraumatic Neck: no adenopathy, no JVD, supple, symmetrical, trachea midline and thyroid not enlarged, symmetric, no tenderness/mass/nodules Lymph nodes: Cervical, supraclavicular, and axillary nodes normal. Resp: clear to auscultation bilaterally Back: symmetric, no curvature. ROM normal. No CVA tenderness. Cardio: regular rate and rhythm, S1, S2 normal, no murmur, click, rub or gallop GI: soft, non-tender; bowel sounds normal; no masses,  no organomegaly Extremities: extremities normal, atraumatic, no cyanosis or edema  ECOG PERFORMANCE STATUS: 1 - Symptomatic but completely ambulatory  Blood pressure 116/83, pulse 94, temperature 97.8 F (36.6 C), temperature source Oral, resp. rate 18, height _0  (1.778 m), weight 148 lb 9.6 oz (67.4 kg), SpO2 100 %.  LABORATORY DATA: Lab Results  Component Value  Date   WBC 3.5 (L) 05/28/2016   HGB 9.5 (L) 05/28/2016   HCT 28.7 (L) 05/28/2016   MCV 94.1 05/28/2016   PLT 229 05/28/2016      Chemistry      Component Value Date/Time   NA 139 05/28/2016 1111   K 4.4 05/28/2016 1111   CL 105 03/05/2016 1206   CO2 26 05/28/2016 1111   BUN 16.4 05/28/2016 1111   CREATININE 1.5 (H) 05/28/2016 1111      Component Value Date/Time   CALCIUM 9.6 05/28/2016 1111   ALKPHOS 106 05/28/2016 1111   AST 22 05/28/2016 1111   ALT 10 05/28/2016 1111   BILITOT 0.41 05/28/2016 1111       RADIOGRAPHIC STUDIES: Ct Chest W Contrast  Result Date: 05/07/2016 CLINICAL DATA:  Stage IV (T3, N0, M1a) right lower lobe lung adenocarcinoma diagnosed August 2017 with large right pleural effusion status post PleurX catheter placement, presenting for restaging with ongoing chemotherapy. EXAM: CT CHEST, ABDOMEN, AND PELVIS WITH CONTRAST TECHNIQUE: Multidetector CT imaging of the chest, abdomen and pelvis was performed following the standard protocol during bolus administration of intravenous contrast. CONTRAST:  142m ISOVUE-300 IOPAMIDOL (ISOVUE-300) INJECTION 61% COMPARISON:  02/22/2016 PET-CT. 01/25/2016 chest CT angiogram and CT abdomen/pelvis. FINDINGS: CT CHEST FINDINGS Cardiovascular: Normal heart size. Stable  trace pericardial effusion/thickening. Right subclavian MediPort terminates in the right atrium near the cavoatrial junction. Great vessels are normal in course and caliber. No central pulmonary emboli. Mediastinum/Nodes: No discrete thyroid nodules. Unremarkable esophagus. No pathologically enlarged axillary, mediastinal or hilar lymph nodes. Lungs/Pleura: Moderate to large right hydropneumothorax is overall not significantly changed in size with decreased pleural effusion component. Right PleurX catheter terminates in the medial basilar right pleural space. No left hydronephrosis. No left pleural effusion. Right lower lobe 5.6 x 2.9 cm lung mass (series 4/ image 115),  decreased from 7.4 x 3.9 cm on 02/22/2016. There is moderate compressive atelectasis in the posterior right upper lobe, peripheral right middle lobe and right lower lobe, not appreciably changed. There is diffuse thickening of the peribronchovascular interstitium throughout the right lung with mildly nodular interlobular septal thickening throughout the right lung, most prominent in the right upper lobe, which appears overall decreased since 02/22/2016, consistent with decreasing lymphangitic tumor. Confluent poorly marginated peripheral peribronchovascular nodularity throughout the left lower lobe and less prominently involving the left upper lobe is overall mildly increased. For example a 2.3 x 1.2 cm peripheral left lower lobe nodule (series 4/ image 129), previously 1.7 x 1.2 cm on 02/22/2016, mildly increased. A 2.2 x 1.7 cm posterior left lower lobe nodule (series 4/ image 113), previously 1.6 x 1.3 cm, increased, although with areas of interval decreased density centrally. Posterior left lower lobe 1.4 x 1.1 cm nodule (series 4/image 137), previously 1.2 x 0.9 cm, mildly increased. Anterior left upper lobe nodularity measures up to 0.8 cm (series 4/image 45), not appreciably changed. Musculoskeletal: No aggressive appearing focal osseous lesions. Mild thoracic spondylosis . CT ABDOMEN PELVIS FINDINGS Hepatobiliary: Normal liver with no liver mass. Normal gallbladder with no radiopaque cholelithiasis. No biliary ductal dilatation. Pancreas: Normal, with no mass or duct dilation. Spleen: Normal size. No mass. Adrenals/Urinary Tract: Normal adrenals. Subcentimeter hypodense renal cortical lesion in the posterior interpolar left kidney, too small to characterize, stable, suggesting a benign renal cyst. Otherwise normal kidneys with no hydronephrosis and no new renal lesions. Collapsed and grossly normal bladder. Stomach/Bowel: Grossly normal stomach. Normal caliber small bowel with no small bowel wall thickening.  Normal appendix . Normal large bowel with no diverticulosis, large bowel wall thickening or pericolonic fat stranding. Vascular/Lymphatic: Atherosclerotic nonaneurysmal abdominal aorta. Patent portal, splenic, hepatic and renal veins. No pathologically enlarged lymph nodes in the abdomen or pelvis. Reproductive: Stable mildly enlarged prostate . Other: No pneumoperitoneum, ascites or focal fluid collection. Musculoskeletal: No aggressive appearing focal osseous lesions. Mild-to-moderate lumbar spondylosis. IMPRESSION: 1. Primary right lower lobe tumor is decreased in size. 2. Findings of lymphangitic tumor throughout the right lung are decreased. 3. Confluent peripheral pulmonary metastases in the left lower lobe are mildly increased. Small pulmonary metastases in the peripheral left upper lobe are stable. 4. Moderate to large right hydropneumothorax, overall stable in size with PleurX catheter in place, with decreased pleural effusion component. Stable associated compressive atelectasis in the right lung. 5. No new sites of metastatic disease. No metastatic disease in the abdomen, pelvis or skeleton. 6. Aortic atherosclerosis. Electronically Signed   By: Ilona Sorrel M.D.   On: 05/07/2016 12:23   Ct Abdomen Pelvis W Contrast  Result Date: 05/07/2016 CLINICAL DATA:  Stage IV (T3, N0, M1a) right lower lobe lung adenocarcinoma diagnosed August 2017 with large right pleural effusion status post PleurX catheter placement, presenting for restaging with ongoing chemotherapy. EXAM: CT CHEST, ABDOMEN, AND PELVIS WITH CONTRAST TECHNIQUE: Multidetector CT imaging  of the chest, abdomen and pelvis was performed following the standard protocol during bolus administration of intravenous contrast. CONTRAST:  167m ISOVUE-300 IOPAMIDOL (ISOVUE-300) INJECTION 61% COMPARISON:  02/22/2016 PET-CT. 01/25/2016 chest CT angiogram and CT abdomen/pelvis. FINDINGS: CT CHEST FINDINGS Cardiovascular: Normal heart size. Stable trace  pericardial effusion/thickening. Right subclavian MediPort terminates in the right atrium near the cavoatrial junction. Great vessels are normal in course and caliber. No central pulmonary emboli. Mediastinum/Nodes: No discrete thyroid nodules. Unremarkable esophagus. No pathologically enlarged axillary, mediastinal or hilar lymph nodes. Lungs/Pleura: Moderate to large right hydropneumothorax is overall not significantly changed in size with decreased pleural effusion component. Right PleurX catheter terminates in the medial basilar right pleural space. No left hydronephrosis. No left pleural effusion. Right lower lobe 5.6 x 2.9 cm lung mass (series 4/ image 115), decreased from 7.4 x 3.9 cm on 02/22/2016. There is moderate compressive atelectasis in the posterior right upper lobe, peripheral right middle lobe and right lower lobe, not appreciably changed. There is diffuse thickening of the peribronchovascular interstitium throughout the right lung with mildly nodular interlobular septal thickening throughout the right lung, most prominent in the right upper lobe, which appears overall decreased since 02/22/2016, consistent with decreasing lymphangitic tumor. Confluent poorly marginated peripheral peribronchovascular nodularity throughout the left lower lobe and less prominently involving the left upper lobe is overall mildly increased. For example a 2.3 x 1.2 cm peripheral left lower lobe nodule (series 4/ image 129), previously 1.7 x 1.2 cm on 02/22/2016, mildly increased. A 2.2 x 1.7 cm posterior left lower lobe nodule (series 4/ image 113), previously 1.6 x 1.3 cm, increased, although with areas of interval decreased density centrally. Posterior left lower lobe 1.4 x 1.1 cm nodule (series 4/image 137), previously 1.2 x 0.9 cm, mildly increased. Anterior left upper lobe nodularity measures up to 0.8 cm (series 4/image 45), not appreciably changed. Musculoskeletal: No aggressive appearing focal osseous lesions.  Mild thoracic spondylosis . CT ABDOMEN PELVIS FINDINGS Hepatobiliary: Normal liver with no liver mass. Normal gallbladder with no radiopaque cholelithiasis. No biliary ductal dilatation. Pancreas: Normal, with no mass or duct dilation. Spleen: Normal size. No mass. Adrenals/Urinary Tract: Normal adrenals. Subcentimeter hypodense renal cortical lesion in the posterior interpolar left kidney, too small to characterize, stable, suggesting a benign renal cyst. Otherwise normal kidneys with no hydronephrosis and no new renal lesions. Collapsed and grossly normal bladder. Stomach/Bowel: Grossly normal stomach. Normal caliber small bowel with no small bowel wall thickening. Normal appendix . Normal large bowel with no diverticulosis, large bowel wall thickening or pericolonic fat stranding. Vascular/Lymphatic: Atherosclerotic nonaneurysmal abdominal aorta. Patent portal, splenic, hepatic and renal veins. No pathologically enlarged lymph nodes in the abdomen or pelvis. Reproductive: Stable mildly enlarged prostate . Other: No pneumoperitoneum, ascites or focal fluid collection. Musculoskeletal: No aggressive appearing focal osseous lesions. Mild-to-moderate lumbar spondylosis. IMPRESSION: 1. Primary right lower lobe tumor is decreased in size. 2. Findings of lymphangitic tumor throughout the right lung are decreased. 3. Confluent peripheral pulmonary metastases in the left lower lobe are mildly increased. Small pulmonary metastases in the peripheral left upper lobe are stable. 4. Moderate to large right hydropneumothorax, overall stable in size with PleurX catheter in place, with decreased pleural effusion component. Stable associated compressive atelectasis in the right lung. 5. No new sites of metastatic disease. No metastatic disease in the abdomen, pelvis or skeleton. 6. Aortic atherosclerosis. Electronically Signed   By: JIlona SorrelM.D.   On: 05/07/2016 12:23    ASSESSMENT AND PLAN:  This is  a very pleasant 55  years old white male recently diagnosed with a stage IV non-small cell lung cancer, adenocarcinoma. He is currently on systemic chemotherapy with carboplatin, Alimta and Avastin status post 4 cycles. Previous CT scan of the chest, abdomen and pelvis showed improvement of his disease. I recommended for the patient to continue his systemic chemotherapy as scheduled and he will receive cycle #5 today. For the chemotherapy-induced anemia, we'll continue to monitor the patient closely and consider him for transfusion if needed. He would come back for follow-up visit in 3 weeks for evaluation before starting cycle #6. The patient was advised to call immediately if he has any concerning symptoms in the interval. The patient voices understanding of current disease status and treatment options and is in agreement with the current care plan.  All questions were answered. The patient knows to call the clinic with any problems, questions or concerns. We can certainly see the patient much sooner if necessary. I spent 10 minutes counseling the patient face to face. The total time spent in the appointment was 15 minutes. Disclaimer: This note was dictated with voice recognition software. Similar sounding words can inadvertently be transcribed and may not be corrected upon review.

## 2016-06-04 ENCOUNTER — Other Ambulatory Visit (HOSPITAL_BASED_OUTPATIENT_CLINIC_OR_DEPARTMENT_OTHER): Payer: Medicaid Other

## 2016-06-04 DIAGNOSIS — C3431 Malignant neoplasm of lower lobe, right bronchus or lung: Secondary | ICD-10-CM | POA: Diagnosis present

## 2016-06-04 DIAGNOSIS — J90 Pleural effusion, not elsewhere classified: Secondary | ICD-10-CM

## 2016-06-04 DIAGNOSIS — C3491 Malignant neoplasm of unspecified part of right bronchus or lung: Secondary | ICD-10-CM

## 2016-06-04 LAB — COMPREHENSIVE METABOLIC PANEL
ALT: 12 U/L (ref 0–55)
ANION GAP: 9 meq/L (ref 3–11)
AST: 29 U/L (ref 5–34)
Albumin: 3.4 g/dL — ABNORMAL LOW (ref 3.5–5.0)
Alkaline Phosphatase: 99 U/L (ref 40–150)
BILIRUBIN TOTAL: 0.65 mg/dL (ref 0.20–1.20)
BUN: 14.9 mg/dL (ref 7.0–26.0)
CALCIUM: 9.1 mg/dL (ref 8.4–10.4)
CO2: 25 meq/L (ref 22–29)
CREATININE: 0.9 mg/dL (ref 0.7–1.3)
Chloride: 109 mEq/L (ref 98–109)
EGFR: >90 mL/min/{1.73_m2} (ref >90)
Glucose: 100 mg/dl (ref 70–140)
Potassium: 4.1 mEq/L (ref 3.5–5.1)
Sodium: 143 mEq/L (ref 136–145)
TOTAL PROTEIN: 6.8 g/dL (ref 6.4–8.3)

## 2016-06-04 LAB — CBC WITH DIFFERENTIAL/PLATELET
BASO%: 0.4 % (ref 0.0–2.0)
Basophils Absolute: 0 10*3/uL (ref 0.0–0.1)
EOS%: 0.4 % (ref 0.0–7.0)
Eosinophils Absolute: 0 10*3/uL (ref 0.0–0.5)
HEMATOCRIT: 26.5 % — AB (ref 38.4–49.9)
HGB: 8.8 g/dL — ABNORMAL LOW (ref 13.0–17.1)
LYMPH#: 0.6 10*3/uL — AB (ref 0.9–3.3)
LYMPH%: 22.3 % (ref 14.0–49.0)
MCH: 32.2 pg (ref 27.2–33.4)
MCHC: 33.2 g/dL (ref 32.0–36.0)
MCV: 97.1 fL (ref 79.3–98.0)
MONO#: 0.4 10*3/uL (ref 0.1–0.9)
MONO%: 14.7 % — ABNORMAL HIGH (ref 0.0–14.0)
NEUT%: 62.2 % (ref 39.0–75.0)
NEUTROS ABS: 1.6 10*3/uL (ref 1.5–6.5)
PLATELETS: 260 10*3/uL (ref 140–400)
RBC: 2.73 10*6/uL — ABNORMAL LOW (ref 4.20–5.82)
RDW: 22.7 % — ABNORMAL HIGH (ref 11.0–14.6)
WBC: 2.5 10*3/uL — AB (ref 4.0–10.3)

## 2016-06-09 ENCOUNTER — Ambulatory Visit: Payer: Medicaid Other | Attending: Internal Medicine | Admitting: Internal Medicine

## 2016-06-09 ENCOUNTER — Encounter: Payer: Self-pay | Admitting: Internal Medicine

## 2016-06-09 VITALS — BP 135/88 | HR 111 | Temp 97.6°F | Resp 16 | Wt 148.2 lb

## 2016-06-09 DIAGNOSIS — C3491 Malignant neoplasm of unspecified part of right bronchus or lung: Secondary | ICD-10-CM | POA: Diagnosis present

## 2016-06-09 DIAGNOSIS — R000 Tachycardia, unspecified: Secondary | ICD-10-CM | POA: Diagnosis not present

## 2016-06-09 DIAGNOSIS — Z87891 Personal history of nicotine dependence: Secondary | ICD-10-CM | POA: Insufficient documentation

## 2016-06-09 DIAGNOSIS — J90 Pleural effusion, not elsewhere classified: Secondary | ICD-10-CM | POA: Diagnosis not present

## 2016-06-09 DIAGNOSIS — T451X5A Adverse effect of antineoplastic and immunosuppressive drugs, initial encounter: Secondary | ICD-10-CM

## 2016-06-09 DIAGNOSIS — D6481 Anemia due to antineoplastic chemotherapy: Secondary | ICD-10-CM | POA: Insufficient documentation

## 2016-06-09 DIAGNOSIS — Z23 Encounter for immunization: Secondary | ICD-10-CM

## 2016-06-09 MED ORDER — HYDROCODONE-ACETAMINOPHEN 5-325 MG PO TABS
1.0000 | ORAL_TABLET | ORAL | 0 refills | Status: AC | PRN
Start: 2016-06-09 — End: ?

## 2016-06-09 MED ORDER — TRAMADOL HCL 50 MG PO TABS: 50.0000 mg | ORAL_TABLET | Freq: Four times a day (QID) | ORAL | 1 refills | Status: AC | PRN

## 2016-06-09 NOTE — Patient Instructions (Addendum)
- rn appt in  2 month for pneumovac 23.   -  Health Maintenance, Male A healthy lifestyle and preventative care can promote health and wellness.  Maintain regular health, dental, and eye exams.  Eat a healthy diet. Foods like vegetables, fruits, whole grains, low-fat dairy products, and lean protein foods contain the nutrients you need and are low in calories. Decrease your intake of foods high in solid fats, added sugars, and salt. Get information about a proper diet from your health care provider, if necessary.  Regular physical exercise is one of the most important things you can do for your health. Most adults should get at least 150 minutes of moderate-intensity exercise (any activity that increases your heart rate and causes you to sweat) each week. In addition, most adults need muscle-strengthening exercises on 2 or more days a week.   Maintain a healthy weight. The body mass index (BMI) is a screening tool to identify possible weight problems. It provides an estimate of body fat based on height and weight. Your health care provider can find your BMI and can help you achieve or maintain a healthy weight. For males 20 years and older:  A BMI below 18.5 is considered underweight.  A BMI of 18.5 to 24.9 is normal.  A BMI of 25 to 29.9 is considered overweight.  A BMI of 30 and above is considered obese.  Maintain normal blood lipids and cholesterol by exercising and minimizing your intake of saturated fat. Eat a balanced diet with plenty of fruits and vegetables. Blood tests for lipids and cholesterol should begin at age 60 and be repeated every 5 years. If your lipid or cholesterol levels are high, you are over age 53, or you are at high risk for heart disease, you may need your cholesterol levels checked more frequently.Ongoing high lipid and cholesterol levels should be treated with medicines if diet and exercise are not working.  If you smoke, find out from your health care  provider how to quit. If you do not use tobacco, do not start.  Lung cancer screening is recommended for adults aged 40-80 years who are at high risk for developing lung cancer because of a history of smoking. A yearly low-dose CT scan of the lungs is recommended for people who have at least a 30-pack-year history of smoking and are current smokers or have quit within the past 15 years. A pack year of smoking is smoking an average of 1 pack of cigarettes a day for 1 year (for example, a 30-pack-year history of smoking could mean smoking 1 pack a day for 30 years or 2 packs a day for 15 years). Yearly screening should continue until the smoker has stopped smoking for at least 15 years. Yearly screening should be stopped for people who develop a health problem that would prevent them from having lung cancer treatment.  If you choose to drink alcohol, do not have more than 2 drinks per day. One drink is considered to be 12 oz (360 mL) of beer, 5 oz (150 mL) of wine, or 1.5 oz (45 mL) of liquor.  Avoid the use of street drugs. Do not share needles with anyone. Ask for help if you need support or instructions about stopping the use of drugs.  High blood pressure causes heart disease and increases the risk of stroke. High blood pressure is more likely to develop in:  People who have blood pressure in the end of the normal range (100-139/85-89 mm Hg).  People who are overweight or obese.  People who are African American.  If you are 69-54 years of age, have your blood pressure checked every 3-5 years. If you are 10 years of age or older, have your blood pressure checked every year. You should have your blood pressure measured twice-once when you are at a hospital or clinic, and once when you are not at a hospital or clinic. Record the average of the two measurements. To check your blood pressure when you are not at a hospital or clinic, you can use:  An automated blood pressure machine at a pharmacy.  A  home blood pressure monitor.  If you are 49-78 years old, ask your health care provider if you should take aspirin to prevent heart disease.  Diabetes screening involves taking a blood sample to check your fasting blood sugar level. This should be done once every 3 years after age 25 if you are at a normal weight and without risk factors for diabetes. Testing should be considered at a younger age or be carried out more frequently if you are overweight and have at least 1 risk factor for diabetes.  Colorectal cancer can be detected and often prevented. Most routine colorectal cancer screening begins at the age of 75 and continues through age 49. However, your health care provider may recommend screening at an earlier age if you have risk factors for colon cancer. On a yearly basis, your health care provider may provide home test kits to check for hidden blood in the stool. A small camera at the end of a tube may be used to directly examine the colon (sigmoidoscopy or colonoscopy) to detect the earliest forms of colorectal cancer. Talk to your health care provider about this at age 68 when routine screening begins. A direct exam of the colon should be repeated every 5-10 years through age 4, unless early forms of precancerous polyps or small growths are found.  People who are at an increased risk for hepatitis B should be screened for this virus. You are considered at high risk for hepatitis B if:  You were born in a country where hepatitis B occurs often. Talk with your health care provider about which countries are considered high risk.  Your parents were born in a high-risk country and you have not received a shot to protect against hepatitis B (hepatitis B vaccine).  You have HIV or AIDS.  You use needles to inject street drugs.  You live with, or have sex with, someone who has hepatitis B.  You are a man who has sex with other men (MSM).  You get hemodialysis treatment.  You take certain  medicines for conditions like cancer, organ transplantation, and autoimmune conditions.  Hepatitis C blood testing is recommended for all people born from [birth year] through 1967 and any individual with known risk factors for hepatitis C.  Healthy men should no longer receive prostate-specific antigen (PSA) blood tests as part of routine cancer screening. Talk to your health care provider about prostate cancer screening.  Testicular cancer screening is not recommended for adolescents or adult males who have no symptoms. Screening includes self-exam, a health care provider exam, and other screening tests. Consult with your health care provider about any symptoms you have or any concerns you have about testicular cancer.  Practice safe sex. Use condoms and avoid high-risk sexual practices to reduce the spread of sexually transmitted infections (STIs).  You should be screened for STIs, including gonorrhea and chlamydia if:  You are sexually active and are younger than 24 years.  You are older than 24 years, and your health care provider tells you that you are at risk for this type of infection.  Your sexual activity has changed since you were last screened, and you are at an increased risk for chlamydia or gonorrhea. Ask your health care provider if you are at risk.  If you are at risk of being infected with HIV, it is recommended that you take a prescription medicine daily to prevent HIV infection. This is called pre-exposure prophylaxis (PrEP). You are considered at risk if:  You are a man who has sex with other men (MSM).  You are a heterosexual man who is sexually active with multiple partners.  You take drugs by injection.  You are sexually active with a partner who has HIV.  Talk with your health care provider about whether you are at high risk of being infected with HIV. If you choose to begin PrEP, you should first be tested for HIV. You should then be tested every 3 months for as long as  you are taking PrEP.  Use sunscreen. Apply sunscreen liberally and repeatedly throughout the day. You should seek shade when your shadow is shorter than you. Protect yourself by wearing long sleeves, pants, a wide-brimmed hat, and sunglasses year round whenever you are outdoors.  Tell your health care provider of new moles or changes in moles, especially if there is a change in shape or color. Also, tell your health care provider if a mole is larger than the size of a pencil eraser.  A one-time screening for abdominal aortic aneurysm (AAA) and surgical repair of large AAAs by ultrasound is recommended for men aged 29-75 years who are current or former smokers.  Stay current with your vaccines (immunizations). This information is not intended to replace advice given to you by your health care provider. Make sure you discuss any questions you have with your health care provider. Document Released: 11/15/2007 Document Revised: 06/09/2014 Document Reviewed: 02/20/2015 Elsevier Interactive Patient Education  2017 Elsevier Inc. Pneumococcal Conjugate Vaccine (PCV13) What You Need to Know 1. Why get vaccinated? Vaccination can protect both children and adults from pneumococcal disease. Pneumococcal disease is caused by bacteria that can spread from person to person through close contact. It can cause ear infections, and it can also lead to more serious infections of the:  Lungs (pneumonia),  Blood (bacteremia), and  Covering of the brain and spinal cord (meningitis). Pneumococcal pneumonia is most common among adults. Pneumococcal meningitis can cause deafness and brain damage, and it kills about 1 child in 10 who get it. Anyone can get pneumococcal disease, but children under 93 years of age and adults 67 years and older, people with certain medical conditions, and cigarette smokers are at the highest risk. Before there was a vaccine, the [place] saw:  more than 700 cases of  meningitis,  about 13,000 blood infections,  about 5 million ear infections, and  about 200 deaths in children under 5 each year from pneumococcal disease. Since vaccine became available, severe pneumococcal disease in these children has fallen by 88%. About 18,000 older adults die of pneumococcal disease each year in the [place]. Treatment of pneumococcal infections with penicillin and other drugs is not as effective as it used to be, because some strains of the disease have become resistant to these drugs. This makes prevention of the disease, through vaccination, even more important. 2. PCV13 vaccine  Pneumococcal conjugate vaccine (called PCV13) protects against 13 types of pneumococcal bacteria. PCV13 is routinely given to children at 2, 4, 6, and 7-81 months of age. It is also recommended for children and adults 63 to 75 years of age with certain health conditions, and for all adults 39 years of age and older. Your doctor can give you details. 3. Some people should not get this vaccine Anyone who has ever had a life-threatening allergic reaction to a dose of this vaccine, to an earlier pneumococcal vaccine called PCV7, or to any vaccine containing diphtheria toxoid (for example, DTaP), should not get PCV13. Anyone with a severe allergy to any component of PCV13 should not get the vaccine. Tell your doctor if the person being vaccinated has any severe allergies. If the person scheduled for vaccination is not feeling well, your healthcare provider might decide to reschedule the shot on another day. 4. Risks of a vaccine reaction With any medicine, including vaccines, there is a chance of reactions. These are usually mild and go away on their own, but serious reactions are also possible. Problems reported following PCV13 varied by age and dose in the series. The most common problems reported among children were:  About half became drowsy after the shot, had a temporary loss of appetite,  or had redness or tenderness where the shot was given.  About 1 out of 3 had swelling where the shot was given.  About 1 out of 3 had a mild fever, and about 1 in 20 had a fever over 102.54F.  Up to about 8 out of 10 became fussy or irritable. Adults have reported pain, redness, and swelling where the shot was given; also mild fever, fatigue, headache, chills, or muscle pain. Young children who get PCV13 along with inactivated flu vaccine at the same time may be at increased risk for seizures caused by fever. Ask your doctor for more information. Problems that could happen after any vaccine:  People sometimes faint after a medical procedure, including vaccination. Sitting or lying down for about 15 minutes can help prevent fainting, and injuries caused by a fall. Tell your doctor if you feel dizzy, or have vision changes or ringing in the ears.  Some older children and adults get severe pain in the shoulder and have difficulty moving the arm where a shot was given. This happens very rarely.  Any medication can cause a severe allergic reaction. Such reactions from a vaccine are very rare, estimated at about 1 in a million doses, and would happen within a few minutes to a few hours after the vaccination. As with any medicine, there is a very small chance of a vaccine causing a serious injury or death. The safety of vaccines is always being monitored. For more information, visit: http://www.aguilar.org/ 5. What if there is a serious reaction? What should I look for? Look for anything that concerns you, such as signs of a severe allergic reaction, very high fever, or unusual behavior. Signs of a severe allergic reaction can include hives, swelling of the face and throat, difficulty breathing, a fast heartbeat, dizziness, and weakness-usually within a few minutes to a few hours after the vaccination. What should I do?  If you think it is a severe allergic reaction or other emergency that can't  wait, call 9-1-1 or get the person to the nearest hospital. Otherwise, call your doctor.  Reactions should be reported to the Vaccine Adverse Event Reporting System (VAERS). Your doctor should file this report, or you  can do it yourself through the VAERS web site at www.vaers.SamedayNews.es, or by calling 340-215-6606.  VAERS does not give medical advice. 6. The National Vaccine Injury Compensation Program The [employer] Vaccine Injury Compensation Program (VICP) is a federal program that was created to compensate people who may have been injured by certain vaccines. Persons who believe they may have been injured by a vaccine can learn about the program and about filing a claim by calling 539-649-8721 or visiting the [place] website at GoldCloset.com.ee. There is a time limit to file a claim for compensation. 7. How can I learn more?  Ask your healthcare provider. He or she can give you the vaccine package insert or suggest other sources of information.  Call your local or state health department.  Contact the Centers for Disease Control and Prevention (CDC):  Call 8195948179 (1-800-CDC-INFO) or  Visit CDC's website at http://hunter.com/ Vaccine Information Statement, PCV13 Vaccine (04/06/2014) This information is not intended to replace advice given to you by your health care provider. Make sure you discuss any questions you have with your health care provider. Document Released: 03/16/2006 Document Revised: 02/07/2016 Document Reviewed: 02/07/2016 Elsevier Interactive Patient Education  2017 [employer].

## 2016-06-09 NOTE — Progress Notes (Signed)
Roger Barnes, is a 56 y.o. male  [id number]  [id number]  DOB - August 06, 1960  CC:  Chief Complaint  Patient presents with  . Establish Care       HPI: Harden Bramer is a 56 y.o. male here today to establish medical care.  Last seen Ames PA in clinic 03/10/16, w/ hx stage 4 adenoca of right lung, sp cycle 5 chemo with carboplatin, Alimta and Avastin with Dr Julien Nordmann 05/28/16, sp right Pleurx cath placed by Dr Prescott Gum in Sept, w/ minimal drainage weekly now.  Pt denies any acute pains or complaints. Does get constipated, but improving w/ otc senna-s.    Patient has No headache, No chest pain, No abdominal pain - No Nausea, No new weakness tingling or numbness, No Cough - SOB.    Review of Systems: Per hpi, o/w all systems reviewed and negative.    Allergies  Allergen Reactions  . No Known Allergies    Past Medical History:  Diagnosis Date  . Adenocarcinoma of right lung, stage 4 ([place])   . Encounter for antineoplastic chemotherapy 02/26/2016  . Pleural effusion, right 01/25/2016  . Tachycardia    Current Outpatient Prescriptions on File Prior to Visit  Medication Sig Dispense Refill  . acetaminophen (TYLENOL) 325 MG tablet Take 2 tablets (650 mg total) by mouth every 6 (six) hours as needed for mild pain (or Fever >/= 101). 10 tablet 0  . bisacodyl (DULCOLAX) 5 MG EC tablet Take 2 tablets (10 mg total) by mouth daily. (Patient not taking: Reported on 05/28/2016) 30 tablet 0  . dexamethasone (DECADRON) 4 MG tablet 4 mg by mouth twice a day the day before, day of and day after the chemotherapy every 3 weeks 40 tablet 1  . feeding supplement, ENSURE ENLIVE, (ENSURE ENLIVE) LIQD Take 237 mLs by mouth 2 (two) times daily between meals. 213 mL 12  . folic acid (FOLVITE) 1 MG tablet Take 1 tablet (1 mg total) by mouth daily. 30 tablet 4  . levalbuterol (XOPENEX) 0.63 MG/3ML nebulizer solution Take 3 mLs (0.63 mg total) by nebulization 2 (two) times daily. 3 mL 12  .  lidocaine-prilocaine (EMLA) cream Apply 1 application topically as needed. 30 g 0  . metoprolol tartrate (LOPRESSOR) 25 MG tablet Take 1 tablet (25 mg total) by mouth 2 (two) times daily. 60 tablet 12  . Multiple Vitamins-Minerals (MULTIVITAMIN WITH MINERALS) tablet Take 1 tablet by mouth daily.    . polyethylene glycol (MIRALAX / GLYCOLAX) packet Take 17 g by mouth daily as needed for mild constipation. (Patient not taking: Reported on 05/28/2016) 14 each 0  . prochlorperazine (COMPAZINE) 10 MG tablet Take 1 tablet (10 mg total) by mouth every 6 (six) hours as needed for nausea or vomiting. 30 tablet 0  . senna (SENOKOT) 8.6 MG tablet Take 1 tablet by mouth daily.     No current facility-administered medications on file prior to visit.    Family History  Problem Relation Age of Onset  . Cancer Father     oral  . COPD Maternal Grandmother    Social History   Social History  . Marital status: Single    Spouse name: N/A  . Number of children: N/A  . Years of education: N/A   Occupational History  . Not on file.   Social History Main Topics  . Smoking status: Former Research scientist (life sciences)  . Smokeless tobacco: Never Used     Comment: 01/25/2016 "haven't smoked since <2007"  . Alcohol use  No  . Drug use: No  . Sexual activity: Not Currently   Other Topics Concern  . Not on file   Social History Narrative  . No narrative on file    Objective:   Vitals:   06/09/16 0918  BP: 135/88  Pulse: (!) 111  Resp: 16  Temp: 97.6 F (36.4 C)    Filed Weights   06/09/16 0918  Weight: 148 lb 3.2 oz (67.2 kg)    BP Readings from Last 3 Encounters:  06/09/16 135/88  05/28/16 116/83  05/07/16 118/87    Physical Exam: Constitutional: Patient appears well-developed and well-nourished. No distress. AAOx3, pale appearing, pleasant. HENT: Normocephalic, atraumatic, External right and left ear normal. Oropharynx is clear and moist.  Eyes: Conjunctivae and EOM are normal. PERRL, no scleral  icterus. Neck: Normal ROM. Neck supple. No JVD. No tracheal deviation. No thyromegaly.  Old sutures right SVC region still present since Sept.   CVS: RRR, S1/S2 +, no murmurs, no gallops, no carotid bruit.  Pulmonary: Effort and breath sounds normal, no stridor, rhonchi, wheezes, rales.  +right chest wall portacath.  Right pleurix, dressing c/d/i. Abdominal: Soft. BS +, no distension, tenderness, rebound or guarding.  Musculoskeletal: Normal range of motion. No edema and no tenderness.  LE: bilat/ no c/c/e, pulses 2+ bilateral. Lymphadenopathy: No lymphadenopathy noted, cervical Neuro: Alert.  muscle tone coordination wnl. No cranial nerve deficit grossly. Skin: Skin is warm and dry. No rash noted. Not diaphoretic. No erythema. No pallor. Psychiatric: Normal mood and affect. Behavior, judgment, thought content normal.  Lab Results  Component Value Date   WBC 2.5 (L) 06/04/2016   HGB 8.8 (L) 06/04/2016   HCT 26.5 (L) 06/04/2016   MCV 97.1 06/04/2016   PLT 260 06/04/2016   Lab Results  Component Value Date   CREATININE 0.9 06/04/2016   BUN 14.9 06/04/2016   NA 143 06/04/2016   K 4.1 06/04/2016   CL 105 03/05/2016   CO2 25 06/04/2016    No results found for: HGBA1C Lipid Panel  No results found for: CHOL, TRIG, HDL, CHOLHDL, VLDL, LDLCALC      Depression screen [facility] 2/9 06/09/2016 03/10/2016  Decreased Interest 0 0  Down, Depressed, Hopeless 0 0  PHQ - 2 Score 0 0  Altered sleeping - 1  Tired, decreased energy - 1  Change in appetite - 1  Feeling bad or failure about yourself  - 0  Trouble concentrating - 1  Moving slowly or fidgety/restless - 0  Suicidal thoughts - 0  PHQ-9 Score - 4    Assessment and plan:   1. Adenocarcinoma of right lung, stage 4 (HCC) Chemo rx per Dr Caryl Bis.  2. Pleural effusion, right Decrease drainage now, defer to Dr Kerby Less. - removed old sutures Right svc region today in clinic, no complications  3. Antineoplastic chemotherapy induced  anemia Per onc.  4. Tachycardia - continue bb  5. pneumovac 13v today, serial 23v in 8wks. Will need tdap later as well.  Return in about 3 months (around 09/07/2016), or if symptoms worsen or fail to improve.  The patient was given clear instructions to go to ER or return to medical center if symptoms don't improve, worsen or new problems develop. The patient verbalized understanding. The patient was told to call to get lab results if they haven't heard anything in the next week.    This note has been created with Surveyor, quantity. Any transcriptional errors are unintentional.  Maren Reamer, MD, [place], [place]   06/09/2016, 10:45 AM

## 2016-06-11 ENCOUNTER — Other Ambulatory Visit (HOSPITAL_BASED_OUTPATIENT_CLINIC_OR_DEPARTMENT_OTHER): Payer: Medicaid Other

## 2016-06-11 DIAGNOSIS — C3431 Malignant neoplasm of lower lobe, right bronchus or lung: Secondary | ICD-10-CM | POA: Diagnosis present

## 2016-06-11 DIAGNOSIS — C3491 Malignant neoplasm of unspecified part of right bronchus or lung: Secondary | ICD-10-CM

## 2016-06-11 DIAGNOSIS — J90 Pleural effusion, not elsewhere classified: Secondary | ICD-10-CM

## 2016-06-11 LAB — COMPREHENSIVE METABOLIC PANEL
ALBUMIN: 3.2 g/dL — AB (ref 3.5–5.0)
ALK PHOS: 100 U/L (ref 40–150)
ALT: 12 U/L (ref 0–55)
AST: 26 U/L (ref 5–34)
Anion Gap: 9 mEq/L (ref 3–11)
BUN: 11.9 mg/dL (ref 7.0–26.0)
CO2: 26 meq/L (ref 22–29)
Calcium: 9.2 mg/dL (ref 8.4–10.4)
Chloride: 107 mEq/L (ref 98–109)
Creatinine: 1.1 mg/dL (ref 0.7–1.3)
EGFR: 78 mL/min/{1.73_m2} — AB (ref >90)
GLUCOSE: 106 mg/dL (ref 70–140)
POTASSIUM: 4.4 meq/L (ref 3.5–5.1)
SODIUM: 142 meq/L (ref 136–145)
TOTAL PROTEIN: 6.8 g/dL (ref 6.4–8.3)
Total Bilirubin: 0.46 mg/dL (ref 0.20–1.20)

## 2016-06-11 LAB — CBC WITH DIFFERENTIAL/PLATELET
BASO%: 0.2 % (ref 0.0–2.0)
Basophils Absolute: 0 10*3/uL (ref 0.0–0.1)
EOS%: 0.3 % (ref 0.0–7.0)
Eosinophils Absolute: 0 10*3/uL (ref 0.0–0.5)
HCT: 25.3 % — ABNORMAL LOW (ref 38.4–49.9)
HEMOGLOBIN: 8.6 g/dL — AB (ref 13.0–17.1)
LYMPH%: 11.6 % — AB (ref 14.0–49.0)
MCH: 33.6 pg — AB (ref 27.2–33.4)
MCHC: 33.8 g/dL (ref 32.0–36.0)
MCV: 99.5 fL — AB (ref 79.3–98.0)
MONO#: 0.6 10*3/uL (ref 0.1–0.9)
MONO%: 13.5 % (ref 0.0–14.0)
NEUT%: 74.4 % (ref 39.0–75.0)
NEUTROS ABS: 3 10*3/uL (ref 1.5–6.5)
Platelets: 78 10*3/uL — ABNORMAL LOW (ref 140–400)
RBC: 2.55 10*6/uL — AB (ref 4.20–5.82)
RDW: 25.5 % — AB (ref 11.0–14.6)
WBC: 4.1 10*3/uL (ref 4.0–10.3)
lymph#: 0.5 10*3/uL — ABNORMAL LOW (ref 0.9–3.3)

## 2016-06-18 ENCOUNTER — Ambulatory Visit: Payer: Medicaid Other

## 2016-06-18 ENCOUNTER — Other Ambulatory Visit: Payer: Medicare HMO

## 2016-06-18 ENCOUNTER — Ambulatory Visit: Payer: Medicare HMO | Admitting: Internal Medicine

## 2016-06-18 ENCOUNTER — Telehealth: Payer: Self-pay | Admitting: Internal Medicine

## 2016-06-18 NOTE — Telephone Encounter (Signed)
Received message from switchboard that patient called to cx 1/17 lab/fu/tx and wants to reschedule. Appointments cancelled and message sent to desk nurse - we will need to know when MM can see patient for f/u to reschedule.

## 2016-06-18 NOTE — Telephone Encounter (Signed)
Patient called to cancel his appointment today for 11:30 due to inclement weather  And needs to reschedule his call back number is [phone]

## 2016-06-21 ENCOUNTER — Telehealth: Payer: Self-pay

## 2016-06-21 NOTE — Telephone Encounter (Signed)
Spoke with the patient and he is aware of his new appts.  A message was sent back to Diane as he was unsure if he should keep his 1/24 lab.

## 2016-06-25 ENCOUNTER — Other Ambulatory Visit (HOSPITAL_BASED_OUTPATIENT_CLINIC_OR_DEPARTMENT_OTHER): Payer: Medicaid Other

## 2016-06-25 DIAGNOSIS — C3431 Malignant neoplasm of lower lobe, right bronchus or lung: Secondary | ICD-10-CM

## 2016-06-25 DIAGNOSIS — C3491 Malignant neoplasm of unspecified part of right bronchus or lung: Secondary | ICD-10-CM

## 2016-06-25 DIAGNOSIS — J90 Pleural effusion, not elsewhere classified: Secondary | ICD-10-CM

## 2016-06-25 LAB — COMPREHENSIVE METABOLIC PANEL
ALT: 11 U/L (ref 0–55)
AST: 23 U/L (ref 5–34)
Albumin: 3.1 g/dL — ABNORMAL LOW (ref 3.5–5.0)
Alkaline Phosphatase: 98 U/L (ref 40–150)
Anion Gap: 8 mEq/L (ref 3–11)
BILIRUBIN TOTAL: 0.37 mg/dL (ref 0.20–1.20)
BUN: 10 mg/dL (ref 7.0–26.0)
CHLORIDE: 105 meq/L (ref 98–109)
CO2: 25 meq/L (ref 22–29)
CREATININE: 1 mg/dL (ref 0.7–1.3)
Calcium: 9 mg/dL (ref 8.4–10.4)
EGFR: 86 mL/min/{1.73_m2} — ABNORMAL LOW (ref >90)
GLUCOSE: 86 mg/dL (ref 70–140)
Potassium: 4.3 mEq/L (ref 3.5–5.1)
SODIUM: 138 meq/L (ref 136–145)
TOTAL PROTEIN: 6.7 g/dL (ref 6.4–8.3)

## 2016-06-25 LAB — CBC WITH DIFFERENTIAL/PLATELET
BASO%: 0.2 % (ref 0.0–2.0)
Basophils Absolute: 0 10*3/uL (ref 0.0–0.1)
EOS%: 1.4 % (ref 0.0–7.0)
Eosinophils Absolute: 0.1 10*3/uL (ref 0.0–0.5)
HCT: 29.1 % — ABNORMAL LOW (ref 38.4–49.9)
HGB: 9.5 g/dL — ABNORMAL LOW (ref 13.0–17.1)
LYMPH%: 14.2 % (ref 14.0–49.0)
MCH: 33.9 pg — ABNORMAL HIGH (ref 27.2–33.4)
MCHC: 32.6 g/dL (ref 32.0–36.0)
MCV: 103.9 fL — ABNORMAL HIGH (ref 79.3–98.0)
MONO#: 0.9 10*3/uL (ref 0.1–0.9)
MONO%: 17.2 % — AB (ref 0.0–14.0)
NEUT%: 67 % (ref 39.0–75.0)
NEUTROS ABS: 3.3 10*3/uL (ref 1.5–6.5)
Platelets: 333 10*3/uL (ref 140–400)
RBC: 2.8 10*6/uL — AB (ref 4.20–5.82)
RDW: 22.1 % — ABNORMAL HIGH (ref 11.0–14.6)
WBC: 4.9 10*3/uL (ref 4.0–10.3)
lymph#: 0.7 10*3/uL — ABNORMAL LOW (ref 0.9–3.3)

## 2016-06-30 ENCOUNTER — Other Ambulatory Visit (HOSPITAL_BASED_OUTPATIENT_CLINIC_OR_DEPARTMENT_OTHER): Payer: Medicaid Other

## 2016-06-30 ENCOUNTER — Ambulatory Visit (HOSPITAL_BASED_OUTPATIENT_CLINIC_OR_DEPARTMENT_OTHER): Payer: Medicaid Other | Admitting: Oncology

## 2016-06-30 ENCOUNTER — Telehealth: Payer: Self-pay | Admitting: Internal Medicine

## 2016-06-30 ENCOUNTER — Ambulatory Visit (HOSPITAL_BASED_OUTPATIENT_CLINIC_OR_DEPARTMENT_OTHER): Payer: Medicaid Other

## 2016-06-30 ENCOUNTER — Encounter: Payer: Self-pay | Admitting: Oncology

## 2016-06-30 VITALS — BP 126/78 | HR 83 | Temp 97.9°F | Resp 18 | Ht 70.0 in | Wt 150.8 lb

## 2016-06-30 DIAGNOSIS — C3491 Malignant neoplasm of unspecified part of right bronchus or lung: Secondary | ICD-10-CM

## 2016-06-30 DIAGNOSIS — C3431 Malignant neoplasm of lower lobe, right bronchus or lung: Secondary | ICD-10-CM

## 2016-06-30 DIAGNOSIS — Z5112 Encounter for antineoplastic immunotherapy: Secondary | ICD-10-CM | POA: Diagnosis not present

## 2016-06-30 DIAGNOSIS — Z5111 Encounter for antineoplastic chemotherapy: Secondary | ICD-10-CM

## 2016-06-30 DIAGNOSIS — J90 Pleural effusion, not elsewhere classified: Secondary | ICD-10-CM

## 2016-06-30 LAB — CBC WITH DIFFERENTIAL/PLATELET
BASO%: 0.3 % (ref 0.0–2.0)
Basophils Absolute: 0 10*3/uL (ref 0.0–0.1)
EOS ABS: 0 10*3/uL (ref 0.0–0.5)
EOS%: 0 % (ref 0.0–7.0)
HEMATOCRIT: 30.7 % — AB (ref 38.4–49.9)
HGB: 10.4 g/dL — ABNORMAL LOW (ref 13.0–17.1)
LYMPH#: 0.4 10*3/uL — AB (ref 0.9–3.3)
LYMPH%: 4.3 % — AB (ref 14.0–49.0)
MCH: 35.4 pg — ABNORMAL HIGH (ref 27.2–33.4)
MCHC: 33.8 g/dL (ref 32.0–36.0)
MCV: 104.7 fL — AB (ref 79.3–98.0)
MONO#: 0.5 10*3/uL (ref 0.1–0.9)
MONO%: 5.3 % (ref 0.0–14.0)
NEUT%: 90.1 % — ABNORMAL HIGH (ref 39.0–75.0)
NEUTROS ABS: 8.8 10*3/uL — AB (ref 1.5–6.5)
PLATELETS: 353 10*3/uL (ref 140–400)
RBC: 2.93 10*6/uL — ABNORMAL LOW (ref 4.20–5.82)
RDW: 22.1 % — ABNORMAL HIGH (ref 11.0–14.6)
WBC: 9.7 10*3/uL (ref 4.0–10.3)

## 2016-06-30 LAB — COMPREHENSIVE METABOLIC PANEL
ALBUMIN: 3.4 g/dL — AB (ref 3.5–5.0)
ALK PHOS: 103 U/L (ref 40–150)
ALT: 11 U/L (ref 0–55)
AST: 21 U/L (ref 5–34)
Anion Gap: 11 mEq/L (ref 3–11)
BILIRUBIN TOTAL: 0.34 mg/dL (ref 0.20–1.20)
BUN: 16.5 mg/dL (ref 7.0–26.0)
CALCIUM: 9.7 mg/dL (ref 8.4–10.4)
CO2: 25 mEq/L (ref 22–29)
CREATININE: 1.1 mg/dL (ref 0.7–1.3)
Chloride: 103 mEq/L (ref 98–109)
EGFR: 72 mL/min/{1.73_m2} — AB (ref >90)
Glucose: 159 mg/dl — ABNORMAL HIGH (ref 70–140)
Potassium: 4.4 mEq/L (ref 3.5–5.1)
Sodium: 139 mEq/L (ref 136–145)
TOTAL PROTEIN: 7.6 g/dL (ref 6.4–8.3)

## 2016-06-30 LAB — UA PROTEIN, DIPSTICK - CHCC

## 2016-06-30 MED ORDER — PALONOSETRON HCL INJECTION 0.25 MG/5ML
INTRAVENOUS | Status: AC
  Filled 2016-06-30: qty 5

## 2016-06-30 MED ORDER — VITAMIN K1 10 MG/ML IJ SOLN
INTRAMUSCULAR | Status: AC
  Filled 2016-06-30: qty 1

## 2016-06-30 MED ORDER — DEXAMETHASONE SODIUM PHOSPHATE 10 MG/ML IJ SOLN
10.0000 mg | Freq: Once | INTRAMUSCULAR | Status: AC
  Administered 2016-06-30: 10 mg via INTRAVENOUS

## 2016-06-30 MED ORDER — CYANOCOBALAMIN 1000 MCG/ML IJ SOLN
1000.0000 ug | Freq: Once | INTRAMUSCULAR | Status: AC
  Administered 2016-06-30: 1000 ug via INTRAMUSCULAR

## 2016-06-30 MED ORDER — HEPARIN SOD (PORK) LOCK FLUSH 100 UNIT/ML IV SOLN
500.0000 [IU] | Freq: Once | INTRAVENOUS | Status: AC | PRN
  Administered 2016-06-30: 500 [IU]
  Filled 2016-06-30: qty 5

## 2016-06-30 MED ORDER — PALONOSETRON HCL INJECTION 0.25 MG/5ML
0.2500 mg | Freq: Once | INTRAVENOUS | Status: AC
  Administered 2016-06-30: 0.25 mg via INTRAVENOUS

## 2016-06-30 MED ORDER — CYANOCOBALAMIN 1000 MCG/ML IJ SOLN
INTRAMUSCULAR | Status: AC
  Filled 2016-06-30: qty 1

## 2016-06-30 MED ORDER — SODIUM CHLORIDE 0.9 % IV SOLN
Freq: Once | INTRAVENOUS | Status: AC
  Administered 2016-06-30: 14:00:00 via INTRAVENOUS

## 2016-06-30 MED ORDER — DEXAMETHASONE SODIUM PHOSPHATE 10 MG/ML IJ SOLN
INTRAMUSCULAR | Status: AC
  Filled 2016-06-30: qty 1

## 2016-06-30 MED ORDER — SODIUM CHLORIDE 0.9 % IV SOLN
15.0000 mg/kg | Freq: Once | INTRAVENOUS | Status: AC
  Administered 2016-06-30: 1100 mg via INTRAVENOUS
  Filled 2016-06-30: qty 32

## 2016-06-30 MED ORDER — SODIUM CHLORIDE 0.9 % IV SOLN
520.0000 mg/m2 | Freq: Once | INTRAVENOUS | Status: AC
  Administered 2016-06-30: 1000 mg via INTRAVENOUS
  Filled 2016-06-30: qty 40

## 2016-06-30 MED ORDER — SODIUM CHLORIDE 0.9% FLUSH
10.0000 mL | INTRAVENOUS | Status: DC | PRN
  Administered 2016-06-30: 10 mL
  Filled 2016-06-30: qty 10

## 2016-06-30 MED ORDER — SODIUM CHLORIDE 0.9 % IV SOLN
511.0000 mg | Freq: Once | INTRAVENOUS | Status: AC
  Administered 2016-06-30: 510 mg via INTRAVENOUS
  Filled 2016-06-30: qty 51

## 2016-06-30 NOTE — Patient Instructions (Signed)
[facility] Discharge Instructions for Patients Receiving Chemotherapy  Today you received the following chemotherapy agents Alimta, Carboplatin, and Avastin  To help prevent nausea and vomiting after your treatment, we encourage you to take your nausea medication as directed. Take Compazine for nausea.    If you develop nausea and vomiting that is not controlled by your nausea medication, call the clinic.   BELOW ARE SYMPTOMS THAT SHOULD BE REPORTED IMMEDIATELY:  *FEVER GREATER THAN 100.5 F  *CHILLS WITH OR WITHOUT FEVER  NAUSEA AND VOMITING THAT IS NOT CONTROLLED WITH YOUR NAUSEA MEDICATION  *UNUSUAL SHORTNESS OF BREATH  *UNUSUAL BRUISING OR BLEEDING  TENDERNESS IN MOUTH AND THROAT WITH OR WITHOUT PRESENCE OF ULCERS  *URINARY PROBLEMS  *BOWEL PROBLEMS  UNUSUAL RASH Items with * indicate a potential emergency and should be followed up as soon as possible.  Feel free to call the clinic you have any questions or concerns. The clinic phone number is [phone].  Please show the [facility] at check-in to the Emergency Department and triage nurse.

## 2016-06-30 NOTE — Progress Notes (Signed)
[place] [phone]   [phone]  OFFICE PROGRESS NOTE  Maren Reamer, MD [place] [address]  DIAGNOSIS: Stage IV (T3, N0, M1a) non-small cell lung cancer, adenocarcinoma presented with large right lower lobe lung mass, large right pleural effusion with collapse of the right lung and shift of the mediastinum and heart to the left as well as left lung pulmonary nodules diagnosed in August 2017.  FOUNDATION ONE: Molecular study:  Genomic Alteration Identified? KRAS G12C Additional Findings? Microsatellite status Cannot Be Determined Tumor Mutation Burden Cannot Be Determined Additional Disease-relevant Genes with No Reportable Alterations Identified? EGFR ALK BRAF MET RET ERBB2 ROS1  PRIOR THERAPY: None  CURRENT THERAPY: Systemic chemotherapy with carboplatin for AUC of 5, Alimta 500 MG/M2 and Avastin 15 MG/KG every 3 weeks. Status post 5 cycles. First cycle was given 03/02/2016.  INTERVAL HISTORY: JUVENAL Barnes 56 y.o. male came to the clinic today for follow-up visit. The patient is currently undergoing systemic chemotherapy with carboplatin, Alimta and Avastin status post 5 cycles. He tolerated the last cycle of his treatment well with no significant adverse effects. He continues to have minimal drainage from the right Pleurx catheter in now he is doing drainage once weekly. He is followed by Dr. Prescott Gum. He has an appointment with Dr. Prescott Gum later this week for reevaluation of the Pleurx catheter. He states that he is due for a chest x-ray and consideration of removal of the Pleurx catheter. He denied having any fever or chills. He has no nausea or vomiting. He has no weight loss or night sweats. He is here today for evaluation before starting cycle #6.  MEDICAL HISTORY: Past Medical History:  Diagnosis Date  . Adenocarcinoma of right lung, stage 4 ([place])   . Encounter for antineoplastic chemotherapy  02/26/2016  . Pleural effusion, right 01/25/2016  . Tachycardia     ALLERGIES:  has No Known Allergies.  MEDICATIONS:  Current Outpatient Prescriptions  Medication Sig Dispense Refill  . acetaminophen (TYLENOL) 325 MG tablet Take 2 tablets (650 mg total) by mouth every 6 (six) hours as needed for mild pain (or Fever >/= 101). 10 tablet 0  . feeding supplement, ENSURE ENLIVE, (ENSURE ENLIVE) LIQD Take 237 mLs by mouth 2 (two) times daily between meals. 811 mL 12  . folic acid (FOLVITE) 1 MG tablet Take 1 tablet (1 mg total) by mouth daily. 30 tablet 4  . HYDROcodone-acetaminophen (NORCO/VICODIN) 5-325 MG tablet Take 1-2 tablets by mouth every 4 (four) hours as needed for moderate pain. 45 tablet 0  . levalbuterol (XOPENEX) 0.63 MG/3ML nebulizer solution Take 3 mLs (0.63 mg total) by nebulization 2 (two) times daily. 3 mL 12  . lidocaine-prilocaine (EMLA) cream Apply 1 application topically as needed. 30 g 0  . metoprolol tartrate (LOPRESSOR) 25 MG tablet Take 1 tablet (25 mg total) by mouth 2 (two) times daily. 60 tablet 12  . Multiple Vitamins-Minerals (MULTIVITAMIN WITH MINERALS) tablet Take 1 tablet by mouth daily.    . prochlorperazine (COMPAZINE) 10 MG tablet Take 1 tablet (10 mg total) by mouth every 6 (six) hours as needed for nausea or vomiting. 30 tablet 0  . senna (SENOKOT) 8.6 MG tablet Take 1 tablet by mouth daily.    . traMADol (ULTRAM) 50 MG tablet Take 1 tablet (50 mg total) by mouth every 6 (six) hours as needed (mild pain). 30 tablet 1  . dexamethasone (DECADRON) 4 MG tablet  4 mg by mouth twice a day the day before, day of and day after the chemotherapy every 3 weeks (Patient not taking: Reported on 06/30/2016) 40 tablet 1  . polyethylene glycol (MIRALAX / GLYCOLAX) packet Take 17 g by mouth daily as needed for mild constipation. (Patient not taking: Reported on 05/28/2016) 14 each 0   No current facility-administered medications for this visit.    Facility-Administered  Medications Ordered in Other Visits  Medication Dose Route Frequency Provider Last Rate Last Dose  . CARBOplatin (PARAPLATIN) 510 mg in sodium chloride 0.9 % 250 mL chemo infusion  510 mg Intravenous Once Curt Bears, MD      . cyanocobalamin ((VITAMIN B-12)) injection 1,000 mcg  1,000 mcg Intramuscular Once Curt Bears, MD      . heparin lock flush 100 unit/mL  500 Units Intracatheter Once PRN Curt Bears, MD      . PEMEtrexed (ALIMTA) 1,000 mg in sodium chloride 0.9 % 100 mL chemo infusion  520 mg/m2 (Treatment Plan Recorded) Intravenous Once Curt Bears, MD   1,000 mg at 06/30/16 1529  . sodium chloride flush (NS) 0.9 % injection 10 mL  10 mL Intracatheter PRN Curt Bears, MD        SURGICAL HISTORY:  Past Surgical History:  Procedure Laterality Date  . CHEST TUBE INSERTION Right 02/01/2016   Procedure: INSERTION PLEURAL DRAINAGE CATHETER;  Surgeon: Ivin Poot, MD;  Location: [facility];  Service: Thoracic;  Laterality: Right;  . PLEURADESIS Right 02/01/2016   Procedure: DOXYCYCLINE PLEURADESIS;  Surgeon: Ivin Poot, MD;  Location: [facility];  Service: Thoracic;  Laterality: Right;  . PORTACATH PLACEMENT Right 03/06/2016   Procedure: INSERTION PORT-A-CATH;  Surgeon: Ivin Poot, MD;  Location: [facility];  Service: Thoracic;  Laterality: Right;  . THORACENTESIS Right 01/25/2016  . VIDEO ASSISTED THORACOSCOPY Right 02/01/2016   Procedure: VIDEO ASSISTED THORACOSCOPY;  Surgeon: Ivin Poot, MD;  Location: [facility];  Service: Thoracic;  Laterality: Right;  Marland Kitchen VIDEO BRONCHOSCOPY N/A 02/01/2016   Procedure: VIDEO BRONCHOSCOPY;  Surgeon: Ivin Poot, MD;  [facility];  Service: Thoracic;  Laterality: N/A;  . WISDOM TOOTH EXTRACTION      REVIEW OF SYSTEMS:  A comprehensive review of systems was negative except for: Constitutional: positive for fatigue   PHYSICAL EXAMINATION: General appearance: alert, cooperative, fatigued and no distress Head: Normocephalic, without  obvious abnormality, atraumatic Neck: no adenopathy, no JVD, supple, symmetrical, trachea midline and thyroid not enlarged, symmetric, no tenderness/mass/nodules Lymph nodes: Cervical, supraclavicular, and axillary nodes normal. Resp: clear to auscultation bilaterally Back: symmetric, no curvature. ROM normal. No CVA tenderness. Cardio: regular rate and rhythm, S1, S2 normal, no murmur, click, rub or gallop GI: soft, non-tender; bowel sounds normal; no masses,  no organomegaly Extremities: extremities normal, atraumatic, no cyanosis or edema  ECOG PERFORMANCE STATUS: 1 - Symptomatic but completely ambulatory  Blood pressure 126/78, pulse 83, temperature 97.9 F (36.6 C), temperature source Oral, resp. rate 18, height 5' 10"  (1.778 Roger), weight 150 lb 12.8 oz (68.4 kg), SpO2 99 %.  LABORATORY DATA: Lab Results  Component Value Date   WBC 9.7 06/30/2016   HGB 10.4 (L) 06/30/2016   HCT 30.7 (L) 06/30/2016   MCV 104.7 (H) 06/30/2016   PLT 353 06/30/2016      Chemistry      Component Value Date/Time   NA 139 06/30/2016 1214   K 4.4 06/30/2016 1214   CL 105 03/05/2016 1206   CO2 25 06/30/2016 1214  BUN 16.5 06/30/2016 1214   CREATININE 1.1 06/30/2016 1214      Component Value Date/Time   CALCIUM 9.7 06/30/2016 1214   ALKPHOS 103 06/30/2016 1214   AST 21 06/30/2016 1214   ALT 11 06/30/2016 1214   BILITOT 0.34 06/30/2016 1214       RADIOGRAPHIC STUDIES: No results found.  ASSESSMENT AND PLAN:  This is a very pleasant 56 year old white male recently diagnosed with a stage IV non-small cell lung cancer, adenocarcinoma. He is currently on systemic chemotherapy with carboplatin, Alimta and Avastin status post 5 cycles. Previous CT scan of the chest, abdomen and pelvis showed improvement of his disease. I recommended for the patient to continue his systemic chemotherapy as scheduled and he will receive cycle #6 today.  He would come back for follow-up visit in 3 weeks for  evaluation before starting cycle #7. I have requested a restaging CT of the chest abdomen and pelvis prior to his next visit. The patient was advised to call immediately if he has any concerning symptoms in the interval. The patient voices understanding of current disease status and treatment options and is in agreement with the current care plan.  Plan reviewed with Dr. Julien Nordmann.  All questions were answered. The patient knows to call the clinic with any problems, questions or concerns. We can certainly see the patient much sooner if necessary. I spent 20 minutes counseling the patient face to face. The total time spent in the appointment was 30 minutes.  Mikey Bussing, DNP, AGPCNP-BC, AOCNP

## 2016-06-30 NOTE — Telephone Encounter (Signed)
Gave patient avs report and appointments for February.  °

## 2016-07-02 ENCOUNTER — Other Ambulatory Visit: Payer: Medicaid Other

## 2016-07-02 ENCOUNTER — Ambulatory Visit
Admission: RE | Admit: 2016-07-02 | Discharge: 2016-07-02 | Disposition: A | Discharge status: Home / Self Care | Payer: Medicaid Other | Source: Ambulatory Visit | Attending: Cardiothoracic Surgery | Admitting: Cardiothoracic Surgery

## 2016-07-02 ENCOUNTER — Other Ambulatory Visit: Payer: Self-pay | Admitting: Cardiothoracic Surgery

## 2016-07-02 ENCOUNTER — Ambulatory Visit (INDEPENDENT_AMBULATORY_CARE_PROVIDER_SITE_OTHER): Payer: Medicaid Other | Admitting: Cardiothoracic Surgery

## 2016-07-02 ENCOUNTER — Encounter: Payer: Self-pay | Admitting: Cardiothoracic Surgery

## 2016-07-02 VITALS — BP 129/80 | HR 79 | Resp 20 | Ht 70.0 in | Wt 150.0 lb

## 2016-07-02 DIAGNOSIS — J90 Pleural effusion, not elsewhere classified: Secondary | ICD-10-CM

## 2016-07-02 DIAGNOSIS — C3491 Malignant neoplasm of unspecified part of right bronchus or lung: Secondary | ICD-10-CM

## 2016-07-02 NOTE — Progress Notes (Signed)
PCP is Maren Reamer, MD Referring Provider is Elmarie Shiley, MD  Chief Complaint  Patient presents with  . Pleural Effusion    f/u with CXR, draining once a week with no fluid    HPI: Routine office visit for right Pleurx for malignant effusion Insignificant drainage for several weeks after chemotherapy administered Chest x-ray today shows minimal right pleural effusion with entrapped right lower lobe from cancer We'll schedule removal of Pleurx catheter. Monday, February 5. He is on no blood thinners  Past Medical History:  Diagnosis Date  . Adenocarcinoma of right lung, stage 4 ([place])   . Encounter for antineoplastic chemotherapy 02/26/2016  . Pleural effusion, right 01/25/2016  . Tachycardia     Past Surgical History:  Procedure Laterality Date  . CHEST TUBE INSERTION Right 02/01/2016   Procedure: INSERTION PLEURAL DRAINAGE CATHETER;  Surgeon: Ivin Poot, MD;  Location: [facility];  Service: Thoracic;  Laterality: Right;  . PLEURADESIS Right 02/01/2016   Procedure: DOXYCYCLINE PLEURADESIS;  Surgeon: Ivin Poot, MD;  Location: [facility];  Service: Thoracic;  Laterality: Right;  . PORTACATH PLACEMENT Right 03/06/2016   Procedure: INSERTION PORT-A-CATH;  Surgeon: Ivin Poot, MD;  Location: [facility];  Service: Thoracic;  Laterality: Right;  . THORACENTESIS Right 01/25/2016  . VIDEO ASSISTED THORACOSCOPY Right 02/01/2016   Procedure: VIDEO ASSISTED THORACOSCOPY;  Surgeon: Ivin Poot, MD;  Location: [facility];  Service: Thoracic;  Laterality: Right;  Marland Kitchen VIDEO BRONCHOSCOPY N/A 02/01/2016   Procedure: VIDEO BRONCHOSCOPY;  Surgeon: Ivin Poot, MD;  Location: [facility] OR;  Service: Thoracic;  Laterality: N/A;  . WISDOM TOOTH EXTRACTION      Family History  Problem Relation Age of Onset  . Cancer Father     oral  . COPD Maternal Grandmother     Social History Social History  Substance Use Topics  . Smoking status: Former Research scientist (life sciences)  . Smokeless tobacco: Never Used     Comment:  01/25/2016 "haven't smoked since <2007"  . Alcohol use No    Current Outpatient Prescriptions  Medication Sig Dispense Refill  . acetaminophen (TYLENOL) 325 MG tablet Take 2 tablets (650 mg total) by mouth every 6 (six) hours as needed for mild pain (or Fever >/= 101). 10 tablet 0  . dexamethasone (DECADRON) 4 MG tablet 4 mg by mouth twice a day the day before, day of and day after the chemotherapy every 3 weeks 40 tablet 1  . feeding supplement, ENSURE ENLIVE, (ENSURE ENLIVE) LIQD Take 237 mLs by mouth 2 (two) times daily between meals. 469 mL 12  . folic acid (FOLVITE) 1 MG tablet Take 1 tablet (1 mg total) by mouth daily. 30 tablet 4  . HYDROcodone-acetaminophen (NORCO/VICODIN) 5-325 MG tablet Take 1-2 tablets by mouth every 4 (four) hours as needed for moderate pain. 45 tablet 0  . levalbuterol (XOPENEX) 0.63 MG/3ML nebulizer solution Take 3 mLs (0.63 mg total) by nebulization 2 (two) times daily. 3 mL 12  . lidocaine-prilocaine (EMLA) cream Apply 1 application topically as needed. 30 g 0  . metoprolol tartrate (LOPRESSOR) 25 MG tablet Take 1 tablet (25 mg total) by mouth 2 (two) times daily. 60 tablet 12  . Multiple Vitamins-Minerals (MULTIVITAMIN WITH MINERALS) tablet Take 1 tablet by mouth daily.    . polyethylene glycol (MIRALAX / GLYCOLAX) packet Take 17 g by mouth daily as needed for mild constipation. 14 each 0  . prochlorperazine (COMPAZINE) 10 MG tablet Take 1 tablet (10 mg total) by mouth  every 6 (six) hours as needed for nausea or vomiting. 30 tablet 0  . senna (SENOKOT) 8.6 MG tablet Take 1 tablet by mouth daily.    . traMADol (ULTRAM) 50 MG tablet Take 1 tablet (50 mg total) by mouth every 6 (six) hours as needed (mild pain). 30 tablet 1   No current facility-administered medications for this visit.     No Known Allergies  Review of Systems  No fever Weight has been stable Pleurx catheter site healed  BP 129/80   Pulse 79   Resp 20   Ht '5\' 10"'$  (1.778 m)   Wt 150 lb  (68 kg)   SpO2 97% Comment: RA  BMI 21.52 kg/m  Physical Exam      Exam    General- alert and comfortable   Lungs-diminished breath sounds at right base but otherwise clear without rales, wheezes   Cor- regular rate and rhythm, no murmur , gallop   Abdomen- soft, non-tender   Extremities - warm, non-tender, minimal edema   Neuro- oriented, appropriate, no focal weakness   Diagnostic Tests: Chest x-ray personally reviewed and counseled patient showing residual right basilar space from entrapped right lower lobe but with minimal pleural effusion  Impression: Resolution of pleural effusion Entrapped right lower lobe from cancer  Plan: Pleurx catheter removal at short stay feb 5  Len Childs, MD [facility] [phone]

## 2016-07-03 ENCOUNTER — Other Ambulatory Visit: Payer: Self-pay | Admitting: *Deleted

## 2016-07-03 DIAGNOSIS — J90 Pleural effusion, not elsewhere classified: Secondary | ICD-10-CM

## 2016-07-08 ENCOUNTER — Encounter (HOSPITAL_COMMUNITY)
Admission: RE | Disposition: A | Discharge status: Home / Self Care | Payer: Self-pay | Source: Ambulatory Visit | Attending: Cardiothoracic Surgery

## 2016-07-08 ENCOUNTER — Encounter (HOSPITAL_COMMUNITY): Payer: Self-pay | Admitting: *Deleted

## 2016-07-08 ENCOUNTER — Ambulatory Visit (HOSPITAL_COMMUNITY)
Admission: RE | Admit: 2016-07-08 | Discharge: 2016-07-08 | Disposition: A | Discharge status: Home / Self Care | Payer: Medicaid Other | Source: Ambulatory Visit | Attending: Cardiothoracic Surgery | Admitting: Cardiothoracic Surgery

## 2016-07-08 DIAGNOSIS — Z452 Encounter for adjustment and management of vascular access device: Secondary | ICD-10-CM | POA: Diagnosis present

## 2016-07-08 DIAGNOSIS — Z87891 Personal history of nicotine dependence: Secondary | ICD-10-CM | POA: Diagnosis not present

## 2016-07-08 DIAGNOSIS — J90 Pleural effusion, not elsewhere classified: Secondary | ICD-10-CM | POA: Diagnosis not present

## 2016-07-08 DIAGNOSIS — C3431 Malignant neoplasm of lower lobe, right bronchus or lung: Secondary | ICD-10-CM | POA: Diagnosis not present

## 2016-07-08 DIAGNOSIS — Z79899 Other long term (current) drug therapy: Secondary | ICD-10-CM | POA: Insufficient documentation

## 2016-07-08 HISTORY — PX: REMOVAL OF PLEURAL DRAINAGE CATHETER: SHX5080

## 2016-07-08 SURGERY — REMOVAL, CLOSED DRAINAGE CATHETER SYSTEM, PLEURAL
Anesthesia: Monitor Anesthesia Care | Laterality: Right

## 2016-07-08 MED ORDER — LIDOCAINE HCL (PF) 1 % IJ SOLN
INTRAMUSCULAR | Status: AC
  Filled 2016-07-08: qty 30

## 2016-07-08 MED ORDER — LIDOCAINE HCL 2 % IJ SOLN
INTRAMUSCULAR | Status: AC
  Filled 2016-07-08: qty 20

## 2016-07-08 NOTE — Progress Notes (Signed)
Patient tolerated procedure well. No c/o prior to discharge.  Breathing well.  Discharged to home via wheelchair.  DA

## 2016-07-08 NOTE — Op Note (Signed)
Roger Barnes  03-05-1961  Date: 07/08/2016   Procedure: Removal of a pleurx catheter.   Consent: verbal and signed consent obtained. The procedure was explained to the patient as well as a risks of bleeding, infection, and pneumothorax.     Pre-procedure: stable   The patient was prepped with two small chloraprep sticks and draped with 4 sterile towels. 6cc of 1% Lidocaine was used to anesthetize around the incision and cuff. The cuff was dissected using a hemastat without issue. A facial band was recognized and cut with an 11 blade. The pleurx was pulled without resistance and two small interrupted sutures were placed.  A sterile dressing with a tegaderm was applied. He has an appointment next week for suture removal at our office.   Post-procedure: stable  Complications: none.   Nicholes Rough, PA-C

## 2016-07-09 ENCOUNTER — Ambulatory Visit: Payer: Medicaid Other | Admitting: Internal Medicine

## 2016-07-09 ENCOUNTER — Ambulatory Visit: Payer: Medicaid Other

## 2016-07-09 ENCOUNTER — Encounter (HOSPITAL_COMMUNITY): Payer: Self-pay | Admitting: Cardiothoracic Surgery

## 2016-07-09 ENCOUNTER — Other Ambulatory Visit (HOSPITAL_BASED_OUTPATIENT_CLINIC_OR_DEPARTMENT_OTHER): Payer: Medicaid Other

## 2016-07-09 DIAGNOSIS — J90 Pleural effusion, not elsewhere classified: Secondary | ICD-10-CM

## 2016-07-09 DIAGNOSIS — C3431 Malignant neoplasm of lower lobe, right bronchus or lung: Secondary | ICD-10-CM

## 2016-07-09 DIAGNOSIS — C3491 Malignant neoplasm of unspecified part of right bronchus or lung: Secondary | ICD-10-CM

## 2016-07-09 LAB — COMPREHENSIVE METABOLIC PANEL
ALK PHOS: 88 U/L (ref 40–150)
ALT: 15 U/L (ref 0–55)
AST: 26 U/L (ref 5–34)
Albumin: 3 g/dL — ABNORMAL LOW (ref 3.5–5.0)
Anion Gap: 6 mEq/L (ref 3–11)
BUN: 14.5 mg/dL (ref 7.0–26.0)
CHLORIDE: 104 meq/L (ref 98–109)
CO2: 29 meq/L (ref 22–29)
Calcium: 9.3 mg/dL (ref 8.4–10.4)
Creatinine: 0.9 mg/dL (ref 0.7–1.3)
GLUCOSE: 92 mg/dL (ref 70–140)
POTASSIUM: 4.3 meq/L (ref 3.5–5.1)
SODIUM: 139 meq/L (ref 136–145)
Total Bilirubin: 0.58 mg/dL (ref 0.20–1.20)
Total Protein: 6.9 g/dL (ref 6.4–8.3)

## 2016-07-09 LAB — CBC WITH DIFFERENTIAL/PLATELET
BASO%: 0.3 % (ref 0.0–2.0)
BASOS ABS: 0 10*3/uL (ref 0.0–0.1)
EOS ABS: 0.1 10*3/uL (ref 0.0–0.5)
EOS%: 1.3 % (ref 0.0–7.0)
HCT: 28.8 % — ABNORMAL LOW (ref 38.4–49.9)
HEMOGLOBIN: 9.6 g/dL — AB (ref 13.0–17.1)
LYMPH%: 11.7 % — AB (ref 14.0–49.0)
MCH: 35 pg — AB (ref 27.2–33.4)
MCHC: 33.2 g/dL (ref 32.0–36.0)
MCV: 105.4 fL — AB (ref 79.3–98.0)
MONO#: 0.5 10*3/uL (ref 0.1–0.9)
MONO%: 12.3 % (ref 0.0–14.0)
NEUT#: 3.3 10*3/uL (ref 1.5–6.5)
NEUT%: 74.4 % (ref 39.0–75.0)
Platelets: 197 10*3/uL (ref 140–400)
RBC: 2.74 10*6/uL — AB (ref 4.20–5.82)
RDW: 18.5 % — AB (ref 11.0–14.6)
WBC: 4.5 10*3/uL (ref 4.0–10.3)
lymph#: 0.5 10*3/uL — ABNORMAL LOW (ref 0.9–3.3)

## 2016-07-16 ENCOUNTER — Other Ambulatory Visit: Payer: Self-pay | Admitting: Cardiothoracic Surgery

## 2016-07-16 ENCOUNTER — Other Ambulatory Visit (HOSPITAL_BASED_OUTPATIENT_CLINIC_OR_DEPARTMENT_OTHER): Payer: Medicaid Other

## 2016-07-16 ENCOUNTER — Other Ambulatory Visit: Payer: Self-pay | Admitting: Medical Oncology

## 2016-07-16 ENCOUNTER — Ambulatory Visit (HOSPITAL_COMMUNITY): Payer: Medicaid Other

## 2016-07-16 DIAGNOSIS — C3491 Malignant neoplasm of unspecified part of right bronchus or lung: Secondary | ICD-10-CM

## 2016-07-16 DIAGNOSIS — C3431 Malignant neoplasm of lower lobe, right bronchus or lung: Secondary | ICD-10-CM

## 2016-07-16 DIAGNOSIS — J90 Pleural effusion, not elsewhere classified: Secondary | ICD-10-CM

## 2016-07-16 LAB — CBC WITH DIFFERENTIAL/PLATELET
BASO%: 0.2 % (ref 0.0–2.0)
Basophils Absolute: 0 10*3/uL (ref 0.0–0.1)
EOS%: 1.3 % (ref 0.0–7.0)
Eosinophils Absolute: 0.1 10*3/uL (ref 0.0–0.5)
HEMATOCRIT: 29.2 % — AB (ref 38.4–49.9)
HEMOGLOBIN: 9.8 g/dL — AB (ref 13.0–17.1)
LYMPH#: 0.5 10*3/uL — AB (ref 0.9–3.3)
LYMPH%: 10.9 % — ABNORMAL LOW (ref 14.0–49.0)
MCH: 35.5 pg — ABNORMAL HIGH (ref 27.2–33.4)
MCHC: 33.5 g/dL (ref 32.0–36.0)
MCV: 106 fL — ABNORMAL HIGH (ref 79.3–98.0)
MONO#: 0.6 10*3/uL (ref 0.1–0.9)
MONO%: 13.7 % (ref 0.0–14.0)
NEUT%: 73.9 % (ref 39.0–75.0)
NEUTROS ABS: 3.1 10*3/uL (ref 1.5–6.5)
PLATELETS: 136 10*3/uL — AB (ref 140–400)
RBC: 2.75 10*6/uL — ABNORMAL LOW (ref 4.20–5.82)
RDW: 18.3 % — ABNORMAL HIGH (ref 11.0–14.6)
WBC: 4.2 10*3/uL (ref 4.0–10.3)

## 2016-07-16 LAB — COMPREHENSIVE METABOLIC PANEL
ALBUMIN: 3.2 g/dL — AB (ref 3.5–5.0)
ALT: 13 U/L (ref 0–55)
ANION GAP: 10 meq/L (ref 3–11)
AST: 24 U/L (ref 5–34)
Alkaline Phosphatase: 98 U/L (ref 40–150)
BILIRUBIN TOTAL: 0.34 mg/dL (ref 0.20–1.20)
BUN: 13.9 mg/dL (ref 7.0–26.0)
CALCIUM: 9.4 mg/dL (ref 8.4–10.4)
CO2: 26 mEq/L (ref 22–29)
CREATININE: 1.1 mg/dL (ref 0.7–1.3)
Chloride: 105 mEq/L (ref 98–109)
EGFR: 77 mL/min/{1.73_m2} — ABNORMAL LOW (ref >90)
Glucose: 108 mg/dl (ref 70–140)
Potassium: 4.2 mEq/L (ref 3.5–5.1)
Sodium: 141 mEq/L (ref 136–145)
TOTAL PROTEIN: 7.2 g/dL (ref 6.4–8.3)

## 2016-07-17 ENCOUNTER — Ambulatory Visit
Admission: RE | Admit: 2016-07-17 | Discharge: 2016-07-17 | Disposition: A | Discharge status: Home / Self Care | Payer: Medicaid Other | Source: Ambulatory Visit | Attending: Cardiothoracic Surgery | Admitting: Cardiothoracic Surgery

## 2016-07-17 ENCOUNTER — Encounter (INDEPENDENT_AMBULATORY_CARE_PROVIDER_SITE_OTHER): Payer: Medicaid Other

## 2016-07-17 DIAGNOSIS — J90 Pleural effusion, not elsewhere classified: Secondary | ICD-10-CM

## 2016-07-17 DIAGNOSIS — Z4802 Encounter for removal of sutures: Secondary | ICD-10-CM | POA: Diagnosis not present

## 2016-07-21 ENCOUNTER — Encounter (HOSPITAL_COMMUNITY): Payer: Self-pay

## 2016-07-21 ENCOUNTER — Ambulatory Visit (HOSPITAL_COMMUNITY)
Admission: RE | Admit: 2016-07-21 | Discharge: 2016-07-21 | Disposition: A | Discharge status: Home / Self Care | Payer: Medicaid Other | Source: Ambulatory Visit | Attending: Oncology | Admitting: Oncology

## 2016-07-21 DIAGNOSIS — C3491 Malignant neoplasm of unspecified part of right bronchus or lung: Secondary | ICD-10-CM | POA: Insufficient documentation

## 2016-07-21 DIAGNOSIS — J948 Other specified pleural conditions: Secondary | ICD-10-CM | POA: Diagnosis not present

## 2016-07-21 MED ORDER — SODIUM CHLORIDE 0.9 % IJ SOLN
INTRAMUSCULAR | Status: AC
  Filled 2016-07-21: qty 50

## 2016-07-21 MED ORDER — IOPAMIDOL (ISOVUE-300) INJECTION 61%
100.0000 mL | Freq: Once | INTRAVENOUS | Status: AC | PRN
  Administered 2016-07-21: 100 mL via INTRAVENOUS

## 2016-07-21 MED ORDER — IOPAMIDOL (ISOVUE-300) INJECTION 61%
INTRAVENOUS | Status: AC
  Filled 2016-07-21: qty 100

## 2016-07-23 ENCOUNTER — Telehealth: Payer: Self-pay | Admitting: Internal Medicine

## 2016-07-23 ENCOUNTER — Encounter: Payer: Self-pay | Admitting: Internal Medicine

## 2016-07-23 ENCOUNTER — Ambulatory Visit: Payer: Medicaid Other

## 2016-07-23 ENCOUNTER — Other Ambulatory Visit (HOSPITAL_BASED_OUTPATIENT_CLINIC_OR_DEPARTMENT_OTHER): Payer: Medicaid Other

## 2016-07-23 ENCOUNTER — Other Ambulatory Visit: Payer: Self-pay | Admitting: Medical Oncology

## 2016-07-23 ENCOUNTER — Ambulatory Visit (HOSPITAL_BASED_OUTPATIENT_CLINIC_OR_DEPARTMENT_OTHER): Payer: Medicaid Other | Admitting: Internal Medicine

## 2016-07-23 VITALS — BP 115/80 | HR 83 | Temp 98.6°F | Resp 18 | Ht 70.0 in | Wt 146.0 lb

## 2016-07-23 DIAGNOSIS — J942 Hemothorax: Secondary | ICD-10-CM

## 2016-07-23 DIAGNOSIS — C3491 Malignant neoplasm of unspecified part of right bronchus or lung: Secondary | ICD-10-CM

## 2016-07-23 DIAGNOSIS — C3431 Malignant neoplasm of lower lobe, right bronchus or lung: Secondary | ICD-10-CM | POA: Diagnosis present

## 2016-07-23 DIAGNOSIS — R062 Wheezing: Secondary | ICD-10-CM

## 2016-07-23 DIAGNOSIS — Z7189 Other specified counseling: Secondary | ICD-10-CM

## 2016-07-23 DIAGNOSIS — J90 Pleural effusion, not elsewhere classified: Secondary | ICD-10-CM

## 2016-07-23 DIAGNOSIS — J9811 Atelectasis: Secondary | ICD-10-CM

## 2016-07-23 DIAGNOSIS — Z5111 Encounter for antineoplastic chemotherapy: Secondary | ICD-10-CM

## 2016-07-23 DIAGNOSIS — R079 Chest pain, unspecified: Secondary | ICD-10-CM | POA: Diagnosis not present

## 2016-07-23 DIAGNOSIS — I10 Essential (primary) hypertension: Secondary | ICD-10-CM

## 2016-07-23 HISTORY — DX: Other specified counseling: Z71.89

## 2016-07-23 LAB — CBC WITH DIFFERENTIAL/PLATELET
BASO%: 0 % (ref 0.0–2.0)
BASOS ABS: 0 10*3/uL (ref 0.0–0.1)
EOS ABS: 0 10*3/uL (ref 0.0–0.5)
EOS%: 0 % (ref 0.0–7.0)
HEMATOCRIT: 32.6 % — AB (ref 38.4–49.9)
HEMOGLOBIN: 10.6 g/dL — AB (ref 13.0–17.1)
LYMPH#: 0.4 10*3/uL — AB (ref 0.9–3.3)
LYMPH%: 7.1 % — ABNORMAL LOW (ref 14.0–49.0)
MCH: 34.3 pg — AB (ref 27.2–33.4)
MCHC: 32.5 g/dL (ref 32.0–36.0)
MCV: 105.5 fL — AB (ref 79.3–98.0)
MONO#: 0.7 10*3/uL (ref 0.1–0.9)
MONO%: 13.3 % (ref 0.0–14.0)
NEUT%: 79.6 % — ABNORMAL HIGH (ref 39.0–75.0)
NEUTROS ABS: 4 10*3/uL (ref 1.5–6.5)
Platelets: 262 10*3/uL (ref 140–400)
RBC: 3.09 10*6/uL — ABNORMAL LOW (ref 4.20–5.82)
RDW: 16.1 % — AB (ref 11.0–14.6)
WBC: 5 10*3/uL (ref 4.0–10.3)

## 2016-07-23 LAB — COMPREHENSIVE METABOLIC PANEL
ALBUMIN: 3.4 g/dL — AB (ref 3.5–5.0)
ALK PHOS: 103 U/L (ref 40–150)
ALT: 10 U/L (ref 0–55)
AST: 23 U/L (ref 5–34)
Anion Gap: 10 mEq/L (ref 3–11)
BILIRUBIN TOTAL: 0.36 mg/dL (ref 0.20–1.20)
BUN: 12.2 mg/dL (ref 7.0–26.0)
CALCIUM: 9.9 mg/dL (ref 8.4–10.4)
CO2: 25 mEq/L (ref 22–29)
Chloride: 104 mEq/L (ref 98–109)
Creatinine: 1.1 mg/dL (ref 0.7–1.3)
EGFR: 75 mL/min/{1.73_m2} — AB (ref >90)
GLUCOSE: 109 mg/dL (ref 70–140)
POTASSIUM: 4.7 meq/L (ref 3.5–5.1)
SODIUM: 139 meq/L (ref 136–145)
TOTAL PROTEIN: 7.7 g/dL (ref 6.4–8.3)

## 2016-07-23 LAB — UA PROTEIN, DIPSTICK - CHCC: PROTEIN: NEGATIVE mg/dL

## 2016-07-23 NOTE — Progress Notes (Signed)
DISCONTINUE ON PATHWAY REGIMEN - Non-Small Cell Lung  [id number]: Carboplatin AUC=5 + Pemetrexed 500 mg/m2 + Bevacizumab 15 mg/kg q21 Days x 4 Cycles    A cycle is every 21 days:     Carboplatin (Paraplatin(R)) AUC=5 in 250 mL NS IV over 1 hour       Dose Mod: None     Pemetrexed (Alimta(R)) 500 mg/m2 in 100 mL NS IV over 10 minutes, manufacturer recommends not administering to patients with CrCl < 45 mL/min       Dose Mod: None     Bevacizumab (Avastin(R)) 15 mg/kg in 100 mL NS IV over 90 minutes first infusion, 60 minutes second infusion and 30 minutes all subsequent infusions if tolerated       Dose Mod: None         Additional Orders: * All AUC calculations intended to be used in [employer] formula  Note: Patient to receive the following prior to the initiation of therapy:  1) Dexamethasone 4 mg orally twice daily x 6 doses.  First dose 24 hours before  chemotherapy.  2) Folic acid >= 373 mcg orally daily.  First dose at least 5 days prior to the first dose of pemetrexed.  3) Vitamin B12 1,000 mcg intramuscularly every 9 weeks.  First dose at least 5 days prior to the first dose of pemetrexed.  **Always confirm dose/schedule in your pharmacy ordering system**    REASON: Continuation Of Treatment PRIOR TREATMENT: [id number]: Carboplatin AUC=5 + Pemetrexed 500 mg/m2 + Bevacizumab 15 mg/kg q21 Days x 4 Cycles TREATMENT RESPONSE: Partial Response (PR)  START ON PATHWAY REGIMEN - Non-Small Cell Lung  [id number]: Pemetrexed 500 mg/m2 q21 Days Until Progression or Unacceptable Toxicity    A cycle is every 21 days:     Pemetrexed (Alimta(R)) 500 mg/m2 in 100 mL NS IV over 10 minutes, manufacturer recommends not administering to patients with CrCl < 45 mL/min       Dose Mod: None         Additional Orders: Note: Patient to receive the following prior to the initiation of therapy:  1) Dexamethasone 4 mg orally twice daily x 6 doses.  First dose 24 hours before chemotherapy.  2) Folic acid >= 820 mcg orally  daily.  First dose at  least 5 days prior to the first dose of pemetrexed.  3) Vitamin B12 1,000 mcg intramuscularly every 9 weeks.  First dose at least 5 days prior to the first dose of pemetrexed.  **Always confirm dose/schedule in your pharmacy ordering system**    Patient Characteristics: Stage IV Metastatic, Non Squamous, Maintenance - Chemotherapy/Immunotherapy, PS = 0, 1, Initial Pemetrexed and Platinum Agent AJCC T Category: T3 Current Disease Status: Distant Metastases AJCC N Category: N0 AJCC M Category: M1a AJCC 8 Stage Grouping: IVA Histology: Non Squamous Cell ROS1 Rearrangement Status: Negative T790M Mutation Status: Not Applicable - EGFR Mutation Negative/Unknown Other Mutations/Biomarkers: No Other Actionable Mutations PD-L1 Expression Status: Quantity Not Sufficient Chemotherapy/Immunotherapy LOT: Maintenance Chemotherapy/Immunotherapy Molecular Targeted Therapy: Not Appropriate ALK Translocation Status: Negative Would you be surprised if this patient died  in the next year? I would NOT be surprised if this patient died in the next year EGFR Mutation Status: Negative/Wild Type BRAF V600E Mutation Status: Negative Performance Status: PS = 0, 1  Intent of Therapy: Non-Curative / Palliative Intent, Discussed with Patient

## 2016-07-23 NOTE — Telephone Encounter (Signed)
Appointments scheduled per 2/21 LOS. Patient given AVS report and calendars with future scheduled appointments.

## 2016-07-23 NOTE — Progress Notes (Signed)
[facility] [phone]   [phone]  OFFICE PROGRESS NOTE  Maren Reamer, MD [place] [address]  DIAGNOSIS: Stage IV (T3, N0, M1a) non-small cell lung cancer, adenocarcinoma presented with large right lower lobe lung mass, large right pleural effusion with collapse of the right lung and shift of the mediastinum and heart to the left as well as left lung pulmonary nodules diagnosed in August 2017.  FOUNDATION ONE: Molecular study:  Genomic Alteration Identified? KRAS G12C Additional Findings? Microsatellite status Cannot Be Determined Tumor Mutation Burden Cannot Be Determined Additional Disease-relevant Genes with No Reportable Alterations Identified? EGFR ALK BRAF MET RET ERBB2 ROS1  PRIOR THERAPY: Systemic chemotherapy with carboplatin for AUC of 5, Alimta 500 MG/M2 and Avastin 15 MG/KG every 3 weeks. Status post 6 cycles. First cycle was given 03/02/2016. Last dose was given 06/30/2016 with partial response.  CURRENT THERAPY: Maintenance systemic chemotherapy with Alimta 500 MG/M2 and Avastin 15 MG/KG every 3 weeks. First dose 07/30/2016.  INTERVAL HISTORY: Roger Barnes 56 y.o. male came to the clinic today for follow-up visit. The patient has been doing fine today was no specific complaints except for right sided intermittent pain and shortness of breath with exertion. His right-sided chest tube was removed recently by Dr. Prescott Gum. The patient denied having any fever or chills. He has no cough or hemoptysis. He denied having any significant weight loss or night sweats. He has no nausea, vomiting, diarrhea or constipation. He tolerated the previous course of induction systemic chemotherapy was carboplatin, Alimta and Avastin fairly well. He had repeat CT scan of the chest, abdomen and pelvis performed recently and he is here for evaluation and discussion of his scan results.  MEDICAL HISTORY: Past Medical  History:  Diagnosis Date  . Adenocarcinoma of right lung, stage 4 ([facility])   . Encounter for antineoplastic chemotherapy 02/26/2016  . Pleural effusion, right 01/25/2016  . Tachycardia     ALLERGIES:  is allergic to no known allergies.  MEDICATIONS:  Current Outpatient Prescriptions  Medication Sig Dispense Refill  . acetaminophen (TYLENOL) 325 MG tablet Take 2 tablets (650 mg total) by mouth every 6 (six) hours as needed for mild pain (or Fever >/= 101). 10 tablet 0  . dexamethasone (DECADRON) 4 MG tablet 4 mg by mouth twice a day the day before, day of and day after the chemotherapy every 3 weeks 40 tablet 1  . feeding supplement, ENSURE ENLIVE, (ENSURE ENLIVE) LIQD Take 237 mLs by mouth 2 (two) times daily between meals. 947 mL 12  . folic acid (FOLVITE) 1 MG tablet Take 1 tablet (1 mg total) by mouth daily. (Patient taking differently: Take 1 mg by mouth at bedtime. ) 30 tablet 4  . HYDROcodone-acetaminophen (NORCO/VICODIN) 5-325 MG tablet Take 1-2 tablets by mouth every 4 (four) hours as needed for moderate pain. 45 tablet 0  . levalbuterol (XOPENEX) 0.63 MG/3ML nebulizer solution Take 3 mLs (0.63 mg total) by nebulization 2 (two) times daily. 3 mL 12  . lidocaine-prilocaine (EMLA) cream Apply 1 application topically as needed. 30 g 0  . metoprolol tartrate (LOPRESSOR) 25 MG tablet Take 1 tablet (25 mg total) by mouth 2 (two) times daily. 60 tablet 12  . Multiple Vitamins-Minerals (MULTIVITAMIN WITH MINERALS) tablet Take 1 tablet by mouth daily.    . polyethylene glycol (MIRALAX / GLYCOLAX) packet Take 17 g by mouth daily as needed for mild constipation. 14 each 0  .  prochlorperazine (COMPAZINE) 10 MG tablet Take 1 tablet (10 mg total) by mouth every 6 (six) hours as needed for nausea or vomiting. 30 tablet 0  . senna-docusate (SENOKOT S) 8.6-50 MG tablet Take 1-2 tablets by mouth daily as needed for mild constipation.    . traMADol (ULTRAM) 50 MG tablet Take 1 tablet (50 mg total) by mouth  every 6 (six) hours as needed (mild pain). 30 tablet 1   No current facility-administered medications for this visit.     SURGICAL HISTORY:  Past Surgical History:  Procedure Laterality Date  . CHEST TUBE INSERTION Right 02/01/2016   Procedure: INSERTION PLEURAL DRAINAGE CATHETER;  Surgeon: Ivin Poot, MD;  Location: [facility];  Service: Thoracic;  Laterality: Right;  . PLEURADESIS Right 02/01/2016   Procedure: DOXYCYCLINE PLEURADESIS;  Surgeon: Ivin Poot, MD;  Location: [facility];  Service: Thoracic;  Laterality: Right;  . PORTACATH PLACEMENT Right 03/06/2016   Procedure: INSERTION PORT-A-CATH;  Surgeon: Ivin Poot, MD;  Location: [facility];  Service: Thoracic;  Laterality: Right;  . REMOVAL OF PLEURAL DRAINAGE CATHETER Right 07/08/2016   Procedure: REMOVAL OF PLEURAL DRAINAGE CATHETER;  Surgeon: Ivin Poot, MD;  Location: [facility];  Service: Thoracic;  Laterality: Right;  . THORACENTESIS Right 01/25/2016  . VIDEO ASSISTED THORACOSCOPY Right 02/01/2016   Procedure: VIDEO ASSISTED THORACOSCOPY;  Surgeon: Ivin Poot, MD;  Location: [facility];  Service: Thoracic;  Laterality: Right;  Marland Kitchen VIDEO BRONCHOSCOPY N/A 02/01/2016   Procedure: VIDEO BRONCHOSCOPY;  Surgeon: Ivin Poot, MD;  Location: [facility] OR;  Service: Thoracic;  Laterality: N/A;  . WISDOM TOOTH EXTRACTION      REVIEW OF SYSTEMS:  Constitutional: positive for fatigue Eyes: negative Ears, nose, mouth, throat, and face: negative Respiratory: positive for dyspnea on exertion and pleurisy/chest pain Cardiovascular: negative Gastrointestinal: negative Genitourinary:negative Integument/breast: negative Hematologic/lymphatic: negative Musculoskeletal:negative Neurological: negative Behavioral/Psych: negative Endocrine: negative Allergic/Immunologic: negative   PHYSICAL EXAMINATION: General appearance: alert, cooperative, fatigued and no distress Head: Normocephalic, without obvious abnormality, atraumatic Neck: no adenopathy, no  JVD, supple, symmetrical, trachea midline and thyroid not enlarged, symmetric, no tenderness/mass/nodules Lymph nodes: Cervical, supraclavicular, and axillary nodes normal. Resp: diminished breath sounds RLL Back: symmetric, no curvature. ROM normal. No CVA tenderness. Cardio: regular rate and rhythm, S1, S2 normal, no murmur, click, rub or gallop GI: soft, non-tender; bowel sounds normal; no masses,  no organomegaly Extremities: extremities normal, atraumatic, no cyanosis or edema Neurologic: Alert and oriented X 3, normal strength and tone. Normal symmetric reflexes. Normal coordination and gait  ECOG PERFORMANCE STATUS: 1 - Symptomatic but completely ambulatory  Blood pressure 115/80, pulse 83, temperature 98.6 F (37 C), temperature source Oral, resp. rate 18, height _0  (1.778 m), weight 146 lb (66.2 kg), SpO2 96 %.  LABORATORY DATA: Lab Results  Component Value Date   WBC 5.0 07/23/2016   HGB 10.6 (L) 07/23/2016   HCT 32.6 (L) 07/23/2016   MCV 105.5 (H) 07/23/2016   PLT 262 07/23/2016      Chemistry      Component Value Date/Time   NA 139 07/23/2016 1127   K 4.7 07/23/2016 1127   CL 105 03/05/2016 1206   CO2 25 07/23/2016 1127   BUN 12.2 07/23/2016 1127   CREATININE 1.1 07/23/2016 1127      Component Value Date/Time   CALCIUM 9.9 07/23/2016 1127   ALKPHOS 103 07/23/2016 1127   AST 23 07/23/2016 1127   ALT 10 07/23/2016 1127   BILITOT 0.36 07/23/2016 1127  RADIOGRAPHIC STUDIES: Dg Chest 2 View  Result Date: 07/17/2016 CLINICAL DATA:  Right lung cancer. Known right hydropneumothorax. History of pleural drainage catheter placement. EXAM: CHEST  2 VIEW COMPARISON:  PA and lateral chest 07/02/2016. FINDINGS: Pleural drainage catheter seen on the prior examination has been removed. Right hydropneumothorax is unchanged in appearance. Prominent interstitium throughout the left lung and scattered nodular opacities are not notably changed. Heart size is normal.  IMPRESSION: Status post removal right pleural drainage catheter. No change in right hydropneumothorax. No new abnormality. Electronically Signed   By: Inge Rise M.D.   On: 07/17/2016 09:47   Dg Chest 2 View  Result Date: 07/02/2016 CLINICAL DATA:  Right pleural effusion. EXAM: CHEST  2 VIEW COMPARISON:  Radiograph of March 06, 2016. FINDINGS: Stable cardiomediastinal silhouette. Large right hydropneumothorax is again noted and not significantly changed. Stable position of pleural drainage catheter. Right subclavian Port-A-Cath is unchanged in position. Stable interstitial markings are noted throughout the left lung concerning for metastatic disease. Bony thorax is unremarkable. IMPRESSION: Stable large right hydropneumothorax. Stable position of right pleural drainage catheter. No significant change compared to prior exam. Electronically Signed   By: Marijo Conception, M.D.   On: 07/02/2016 16:09   Ct Chest W Contrast  Result Date: 07/21/2016 CLINICAL DATA:  Lung cancer diagnosis September 2017. Ongoing chemotherapy. EXAM: CT CHEST AND ABDOMEN WITH CONTRAST TECHNIQUE: Multidetector CT imaging of the chest and abdomen was performed following the standard protocol during bolus administration of intravenous contrast. CONTRAST:  153m ISOVUE-300 IOPAMIDOL (ISOVUE-300) INJECTION 61% COMPARISON:  PET-CT 02/22/2016, CT 05/07/2016 Edit FINDINGS: CT CHEST FINDINGS Cardiovascular: Port in the RIGHT chest wall. No pericardial fluid. No central pulmonary embolism. Mediastinum/Nodes: No axillary supraclavicular adenopathy. No mediastinal hilar adenopathy. Esophagus normal. Lungs/Pleura: Large RIGHT hydropneumothorax is stable in volume. The PleurX catheter has been removed. The RIGHT lower lobe mass is similar in size measuring 5.4 by 3.0 cm compared with 5.3 by 3.0 cm. Fine interstitial nodular thickening in the RIGHT upper lobe is unchanged. Discrete peripheral nodules in the LEFT lower lobe which reaches  confluence at the bases appears slightly increased in size. For example 13 mm by 15 mm nodule (image 135, series 4) compares with 12 mm by 10 mm nodule. Adjacent 14 mm nodule on image 144 compares with 11 mm. No new nodules identified. Similar single peripheral nodule in the LEFT upper lobe measuring 8 mm is unchanged from prior. Musculoskeletal: No aggressive osseous lesion. New gas collection within the subcutaneous tissues of the anterior LEFT chest wall (image 79, series 2. There is a gas-filled track along the prior course of the PleurX catheter which extends from the hydropneumothorax to the subcutaneous gas collection. (image 79, series 2 and image 44, series 2. CT ABDOMEN FINDINGS Hepatobiliary: No focal hepatic lesion.  Normal cough Pancreas: Pancreas is normal. No ductal dilatation. No pancreatic inflammation. Spleen: Normal spleen. Adrenals/Urinary Tract: Adrenal glands and kidneys normal. Ureters normal para Stomach/Bowel: Stomach and limited view of the bowel is unremarkable. Vascular/Lymphatic: Upper abdominal aorta normal. No upper abdominal adenopathy. Other: No peritoneal metastasis Musculoskeletal: No skeletal metastasis IMPRESSION: 1. Stable mass within the RIGHT lower lobe. 2. Stable interstitial thickening in the RIGHT upper lobe. 3. Stable large RIGHT hydropneumothorax.  PleurX catheter removed. 4. NEW GAS COLLECTING WITHIN THE SUBCUTANEOUS TISSUE OF THE RIGHT LOWER CHEST WALL. There is a communication between this subcutaneous gas collection and the hydropneumothorax along the previous catheter tract. 5. Within LEFT lung, mild INTERVAL INCREASE in size  and confluence of the LEFT lower lobe peripheral nodularity. 6. No evidence of abdominal metastasis. These results will be called to the ordering clinician or representative by the Radiologist Assistant, and communication documented in the PACS or zVision Dashboard. Electronically Signed   By: Suzy Bouchard M.D.   On: 07/21/2016 11:13   Ct  Abdomen W Contrast  Result Date: 07/21/2016 CLINICAL DATA:  Lung cancer diagnosis September 2017. Ongoing chemotherapy. EXAM: CT CHEST AND ABDOMEN WITH CONTRAST TECHNIQUE: Multidetector CT imaging of the chest and abdomen was performed following the standard protocol during bolus administration of intravenous contrast. CONTRAST:  149m ISOVUE-300 IOPAMIDOL (ISOVUE-300) INJECTION 61% COMPARISON:  PET-CT 02/22/2016, CT 05/07/2016 Edit FINDINGS: CT CHEST FINDINGS Cardiovascular: Port in the RIGHT chest wall. No pericardial fluid. No central pulmonary embolism. Mediastinum/Nodes: No axillary supraclavicular adenopathy. No mediastinal hilar adenopathy. Esophagus normal. Lungs/Pleura: Large RIGHT hydropneumothorax is stable in volume. The PleurX catheter has been removed. The RIGHT lower lobe mass is similar in size measuring 5.4 by 3.0 cm compared with 5.3 by 3.0 cm. Fine interstitial nodular thickening in the RIGHT upper lobe is unchanged. Discrete peripheral nodules in the LEFT lower lobe which reaches confluence at the bases appears slightly increased in size. For example 13 mm by 15 mm nodule (image 135, series 4) compares with 12 mm by 10 mm nodule. Adjacent 14 mm nodule on image 144 compares with 11 mm. No new nodules identified. Similar single peripheral nodule in the LEFT upper lobe measuring 8 mm is unchanged from prior. Musculoskeletal: No aggressive osseous lesion. New gas collection within the subcutaneous tissues of the anterior LEFT chest wall (image 79, series 2. There is a gas-filled track along the prior course of the PleurX catheter which extends from the hydropneumothorax to the subcutaneous gas collection. (image 79, series 2 and image 44, series 2. CT ABDOMEN FINDINGS Hepatobiliary: No focal hepatic lesion.  Normal cough Pancreas: Pancreas is normal. No ductal dilatation. No pancreatic inflammation. Spleen: Normal spleen. Adrenals/Urinary Tract: Adrenal glands and kidneys normal. Ureters normal  para Stomach/Bowel: Stomach and limited view of the bowel is unremarkable. Vascular/Lymphatic: Upper abdominal aorta normal. No upper abdominal adenopathy. Other: No peritoneal metastasis Musculoskeletal: No skeletal metastasis IMPRESSION: 1. Stable mass within the RIGHT lower lobe. 2. Stable interstitial thickening in the RIGHT upper lobe. 3. Stable large RIGHT hydropneumothorax.  PleurX catheter removed. 4. NEW GAS COLLECTING WITHIN THE SUBCUTANEOUS TISSUE OF THE RIGHT LOWER CHEST WALL. There is a communication between this subcutaneous gas collection and the hydropneumothorax along the previous catheter tract. 5. Within LEFT lung, mild INTERVAL INCREASE in size and confluence of the LEFT lower lobe peripheral nodularity. 6. No evidence of abdominal metastasis. These results will be called to the ordering clinician or representative by the Radiologist Assistant, and communication documented in the PACS or zVision Dashboard. Electronically Signed   By: SSuzy BouchardM.D.   On: 07/21/2016 11:13    ASSESSMENT AND PLAN:  This is a very pleasant 56years old white male with stage IV non-small cell lung cancer, adenocarcinoma with no actionable mutations. He underwent induction systemic chemotherapy with carboplatin, Alimta and Avastin status post 6 cycles. He tolerated the treatment well with no significant adverse effects. He had repeat CT scan of the chest, abdomen and pelvis performed recently. I personally and independently reviewed the scan images and discuss the results with the patient today. His scan showed no evidence for disease progression. There is new gas collection within the subcutaneous tissue of the right lower  chest wall with communication between the subcutaneous gas collection and the hydropneumothorax along the previous catheter tract. I discussed with the patient his treatment options and gave him the option of close observation versus proceeding with maintenance treatment with Alimta  500 MG/M2 and Avastin 15 MG/KG every 3 weeks. I discussed with the patient adverse effect of the maintenance therapy and he would like to proceed with the treatment. He is expected to start the first dose of this treatment on 07/30/2016. Regarding the subcutaneous gas collection in the right lower chest wall and communication with the right hydropneumothorax, we consulted with Dr. Prescott Gum who recommended continuous observation for now. For hypertension, the patient will continue his current treatment with metoprolol For pain management, he is currently on Norco. He would come back for follow-up visit in 4 weeks for evaluation before starting cycle #2 of his maintenance therapy. He was advised to call immediately if he has any concerning symptoms in the interval. The patient voices understanding of current disease status and treatment options and is in agreement with the current care plan.  All questions were answered. The patient knows to call the clinic with any problems, questions or concerns. We can certainly see the patient much sooner if necessary. Disclaimer: This note was dictated with voice recognition software. Similar sounding words can inadvertently be transcribed and may not be corrected upon review.

## 2016-07-24 ENCOUNTER — Telehealth: Payer: Self-pay | Admitting: *Deleted

## 2016-07-24 NOTE — Telephone Encounter (Signed)
Per 2/21 LOS and staff message I have scheduled appts. Notified the scheduler

## 2016-07-30 ENCOUNTER — Ambulatory Visit: Payer: Medicaid Other

## 2016-07-30 ENCOUNTER — Other Ambulatory Visit (HOSPITAL_BASED_OUTPATIENT_CLINIC_OR_DEPARTMENT_OTHER): Payer: Medicaid Other

## 2016-07-30 ENCOUNTER — Ambulatory Visit: Payer: Medicaid Other | Admitting: Internal Medicine

## 2016-07-30 ENCOUNTER — Ambulatory Visit (HOSPITAL_BASED_OUTPATIENT_CLINIC_OR_DEPARTMENT_OTHER): Payer: Medicaid Other

## 2016-07-30 ENCOUNTER — Other Ambulatory Visit: Payer: Medicaid Other

## 2016-07-30 VITALS — BP 134/64 | HR 92 | Temp 97.7°F | Resp 18

## 2016-07-30 DIAGNOSIS — C3431 Malignant neoplasm of lower lobe, right bronchus or lung: Secondary | ICD-10-CM

## 2016-07-30 DIAGNOSIS — Z5112 Encounter for antineoplastic immunotherapy: Secondary | ICD-10-CM | POA: Diagnosis not present

## 2016-07-30 DIAGNOSIS — C3491 Malignant neoplasm of unspecified part of right bronchus or lung: Secondary | ICD-10-CM

## 2016-07-30 DIAGNOSIS — Z5111 Encounter for antineoplastic chemotherapy: Secondary | ICD-10-CM

## 2016-07-30 LAB — COMPREHENSIVE METABOLIC PANEL
ALK PHOS: 92 U/L (ref 40–150)
ALT: 12 U/L (ref 0–55)
ANION GAP: 10 meq/L (ref 3–11)
AST: 23 U/L (ref 5–34)
Albumin: 3.4 g/dL — ABNORMAL LOW (ref 3.5–5.0)
BILIRUBIN TOTAL: 0.28 mg/dL (ref 0.20–1.20)
BUN: 15.9 mg/dL (ref 7.0–26.0)
CALCIUM: 9.8 mg/dL (ref 8.4–10.4)
CHLORIDE: 103 meq/L (ref 98–109)
CO2: 26 mEq/L (ref 22–29)
CREATININE: 1 mg/dL (ref 0.7–1.3)
EGFR: 79 mL/min/{1.73_m2} — ABNORMAL LOW (ref >90)
Glucose: 118 mg/dl (ref 70–140)
Potassium: 4.3 mEq/L (ref 3.5–5.1)
Sodium: 140 mEq/L (ref 136–145)
Total Protein: 7.6 g/dL (ref 6.4–8.3)

## 2016-07-30 LAB — CBC WITH DIFFERENTIAL/PLATELET
BASO%: 0.1 % (ref 0.0–2.0)
BASOS ABS: 0 10*3/uL (ref 0.0–0.1)
EOS%: 0.1 % (ref 0.0–7.0)
Eosinophils Absolute: 0 10*3/uL (ref 0.0–0.5)
HEMATOCRIT: 33.9 % — AB (ref 38.4–49.9)
HEMOGLOBIN: 11 g/dL — AB (ref 13.0–17.1)
LYMPH#: 0.3 10*3/uL — AB (ref 0.9–3.3)
LYMPH%: 4.3 % — ABNORMAL LOW (ref 14.0–49.0)
MCH: 34 pg — ABNORMAL HIGH (ref 27.2–33.4)
MCHC: 32.4 g/dL (ref 32.0–36.0)
MCV: 104.6 fL — ABNORMAL HIGH (ref 79.3–98.0)
MONO#: 0.8 10*3/uL (ref 0.1–0.9)
MONO%: 9.7 % (ref 0.0–14.0)
NEUT#: 6.6 10*3/uL — ABNORMAL HIGH (ref 1.5–6.5)
NEUT%: 85.8 % — AB (ref 39.0–75.0)
PLATELETS: 236 10*3/uL (ref 140–400)
RBC: 3.24 10*6/uL — ABNORMAL LOW (ref 4.20–5.82)
RDW: 15.6 % — AB (ref 11.0–14.6)
WBC: 7.7 10*3/uL (ref 4.0–10.3)

## 2016-07-30 LAB — UA PROTEIN, DIPSTICK - CHCC: Protein, ur: NEGATIVE mg/dL

## 2016-07-30 MED ORDER — PROCHLORPERAZINE MALEATE 10 MG PO TABS
10.0000 mg | ORAL_TABLET | Freq: Once | ORAL | Status: AC
  Administered 2016-07-30: 10 mg via ORAL

## 2016-07-30 MED ORDER — HEPARIN SOD (PORK) LOCK FLUSH 100 UNIT/ML IV SOLN
500.0000 [IU] | Freq: Once | INTRAVENOUS | Status: AC | PRN
  Administered 2016-07-30: 500 [IU]
  Filled 2016-07-30: qty 5

## 2016-07-30 MED ORDER — PROCHLORPERAZINE MALEATE 10 MG PO TABS
ORAL_TABLET | ORAL | Status: AC
  Filled 2016-07-30: qty 1

## 2016-07-30 MED ORDER — SODIUM CHLORIDE 0.9% FLUSH
10.0000 mL | INTRAVENOUS | Status: DC | PRN
Start: 2016-07-30 — End: 2016-07-30
  Administered 2016-07-30: 10 mL
  Filled 2016-07-30: qty 10

## 2016-07-30 MED ORDER — SODIUM CHLORIDE 0.9 % IV SOLN
1000.0000 mg | Freq: Once | INTRAVENOUS | Status: AC
  Administered 2016-07-30: 1000 mg via INTRAVENOUS
  Filled 2016-07-30: qty 32

## 2016-07-30 MED ORDER — SODIUM CHLORIDE 0.9 % IV SOLN
500.0000 mg/m2 | Freq: Once | INTRAVENOUS | Status: AC
  Administered 2016-07-30: 900 mg via INTRAVENOUS
  Filled 2016-07-30: qty 20

## 2016-07-30 MED ORDER — SODIUM CHLORIDE 0.9 % IV SOLN
Freq: Once | INTRAVENOUS | Status: AC
  Administered 2016-07-30: 13:00:00 via INTRAVENOUS

## 2016-07-30 NOTE — Patient Instructions (Signed)
[facility] Discharge Instructions for Patients Receiving Chemotherapy  Today you received the following chemotherapy agents:  Avastin and Alimta.  To help prevent nausea and vomiting after your treatment, we encourage you to take your nausea medication as directed.   If you develop nausea and vomiting that is not controlled by your nausea medication, call the clinic.   BELOW ARE SYMPTOMS THAT SHOULD BE REPORTED IMMEDIATELY:  *FEVER GREATER THAN 100.5 F  *CHILLS WITH OR WITHOUT FEVER  NAUSEA AND VOMITING THAT IS NOT CONTROLLED WITH YOUR NAUSEA MEDICATION  *UNUSUAL SHORTNESS OF BREATH  *UNUSUAL BRUISING OR BLEEDING  TENDERNESS IN MOUTH AND THROAT WITH OR WITHOUT PRESENCE OF ULCERS  *URINARY PROBLEMS  *BOWEL PROBLEMS  UNUSUAL RASH Items with * indicate a potential emergency and should be followed up as soon as possible.  Feel free to call the clinic you have any questions or concerns. The clinic phone number is [phone].  Please show the CHEMO ALERT CARD at check-in to the Emergency Department and triage nurse.   

## 2016-08-07 ENCOUNTER — Other Ambulatory Visit: Payer: Self-pay | Admitting: *Deleted

## 2016-08-07 ENCOUNTER — Ambulatory Visit: Payer: Medicare HMO | Attending: Internal Medicine | Admitting: *Deleted

## 2016-08-07 DIAGNOSIS — D899 Disorder involving the immune mechanism, unspecified: Secondary | ICD-10-CM

## 2016-08-07 DIAGNOSIS — D849 Immunodeficiency, unspecified: Secondary | ICD-10-CM | POA: Diagnosis not present

## 2016-08-07 DIAGNOSIS — Z23 Encounter for immunization: Secondary | ICD-10-CM | POA: Insufficient documentation

## 2016-08-07 MED ORDER — FOLIC ACID 1 MG PO TABS: 1.0000 mg | ORAL_TABLET | Freq: Every day | ORAL | 3 refills | Status: AC

## 2016-08-07 NOTE — Progress Notes (Signed)
PCV13 vaccine received 06/09/2016 PCV 23 vaccine received 08/07/2016 PCV 23 0.5 mL  Administered Left deltoid IM by T.Sim Choquette,RN. Next appointment due ~08/2016.

## 2016-08-18 ENCOUNTER — Telehealth: Payer: Self-pay | Admitting: *Deleted

## 2016-08-18 NOTE — Telephone Encounter (Signed)
"  Since my last chemotherapy I've been feeling short of breath after the steroid superman effect wore off.  I have a finger oxygen monitor.  When I walk to the bathroom or in the kitchen my level drops 83 to 90.  After I turn the fan on it goes back up in the 90's.  It was suggested to me to notify Dr. Julien Nordmann and do a walk test to see if I need oxygen.  I'll be there Wednesday for lab/Dr. Julien Nordmann and infusion.  Can this walk test be done Wednesday or will this mess anything up?  I do not have a fever.  I cough rarely.  My nose runs like plain water since I started chemotherapy.  "

## 2016-08-18 NOTE — Telephone Encounter (Addendum)
I told pt that we will do oxygen sat testing on wed. He was SOB on phone and I offered smc tomorrow and told him to go to ED if symptoms worsen. He stated he will wait for his f/u on wed.

## 2016-08-20 ENCOUNTER — Other Ambulatory Visit: Payer: Self-pay | Admitting: Medical Oncology

## 2016-08-20 ENCOUNTER — Ambulatory Visit (HOSPITAL_BASED_OUTPATIENT_CLINIC_OR_DEPARTMENT_OTHER): Payer: Medicaid Other

## 2016-08-20 ENCOUNTER — Telehealth: Payer: Self-pay | Admitting: Internal Medicine

## 2016-08-20 ENCOUNTER — Other Ambulatory Visit (HOSPITAL_BASED_OUTPATIENT_CLINIC_OR_DEPARTMENT_OTHER): Payer: Medicaid Other

## 2016-08-20 ENCOUNTER — Ambulatory Visit (HOSPITAL_BASED_OUTPATIENT_CLINIC_OR_DEPARTMENT_OTHER): Payer: Medicaid Other | Admitting: Internal Medicine

## 2016-08-20 ENCOUNTER — Encounter: Payer: Self-pay | Admitting: Internal Medicine

## 2016-08-20 VITALS — BP 111/73 | HR 102

## 2016-08-20 VITALS — BP 135/85 | HR 110 | Temp 98.5°F | Resp 19 | Ht 70.0 in | Wt 140.0 lb

## 2016-08-20 DIAGNOSIS — C3431 Malignant neoplasm of lower lobe, right bronchus or lung: Secondary | ICD-10-CM

## 2016-08-20 DIAGNOSIS — R0602 Shortness of breath: Secondary | ICD-10-CM

## 2016-08-20 DIAGNOSIS — Z79899 Other long term (current) drug therapy: Secondary | ICD-10-CM

## 2016-08-20 DIAGNOSIS — C3491 Malignant neoplasm of unspecified part of right bronchus or lung: Secondary | ICD-10-CM

## 2016-08-20 DIAGNOSIS — Z5112 Encounter for antineoplastic immunotherapy: Secondary | ICD-10-CM | POA: Diagnosis present

## 2016-08-20 DIAGNOSIS — Z5111 Encounter for antineoplastic chemotherapy: Secondary | ICD-10-CM

## 2016-08-20 DIAGNOSIS — J90 Pleural effusion, not elsewhere classified: Secondary | ICD-10-CM

## 2016-08-20 LAB — COMPREHENSIVE METABOLIC PANEL
ALT: 15 U/L (ref 0–55)
ANION GAP: 12 meq/L — AB (ref 3–11)
AST: 27 U/L (ref 5–34)
Albumin: 3.1 g/dL — ABNORMAL LOW (ref 3.5–5.0)
Alkaline Phosphatase: 94 U/L (ref 40–150)
BUN: 18 mg/dL (ref 7.0–26.0)
CALCIUM: 9.9 mg/dL (ref 8.4–10.4)
CHLORIDE: 101 meq/L (ref 98–109)
CO2: 25 meq/L (ref 22–29)
CREATININE: 1 mg/dL (ref 0.7–1.3)
EGFR: 82 mL/min/{1.73_m2} — AB (ref >90)
Glucose: 153 mg/dl — ABNORMAL HIGH (ref 70–140)
Potassium: 4.6 mEq/L (ref 3.5–5.1)
Sodium: 138 mEq/L (ref 136–145)
Total Bilirubin: 0.26 mg/dL (ref 0.20–1.20)
Total Protein: 7.8 g/dL (ref 6.4–8.3)

## 2016-08-20 LAB — CBC WITH DIFFERENTIAL/PLATELET
BASO%: 0.1 % (ref 0.0–2.0)
BASOS ABS: 0 10*3/uL (ref 0.0–0.1)
EOS%: 0 % (ref 0.0–7.0)
Eosinophils Absolute: 0 10*3/uL (ref 0.0–0.5)
HEMATOCRIT: 36.8 % — AB (ref 38.4–49.9)
HGB: 12.3 g/dL — ABNORMAL LOW (ref 13.0–17.1)
LYMPH#: 0.3 10*3/uL — AB (ref 0.9–3.3)
LYMPH%: 2.7 % — AB (ref 14.0–49.0)
MCH: 33.4 pg (ref 27.2–33.4)
MCHC: 33.5 g/dL (ref 32.0–36.0)
MCV: 99.4 fL — ABNORMAL HIGH (ref 79.3–98.0)
MONO#: 0.5 10*3/uL (ref 0.1–0.9)
MONO%: 4.8 % (ref 0.0–14.0)
NEUT#: 9.2 10*3/uL — ABNORMAL HIGH (ref 1.5–6.5)
NEUT%: 92.4 % — AB (ref 39.0–75.0)
PLATELETS: 417 10*3/uL — AB (ref 140–400)
RBC: 3.7 10*6/uL — AB (ref 4.20–5.82)
RDW: 17.2 % — ABNORMAL HIGH (ref 11.0–14.6)
WBC: 10 10*3/uL (ref 4.0–10.3)

## 2016-08-20 LAB — UA PROTEIN, DIPSTICK - CHCC

## 2016-08-20 MED ORDER — SODIUM CHLORIDE 0.9% FLUSH
10.0000 mL | INTRAVENOUS | Status: DC | PRN
  Administered 2016-08-20: 10 mL
  Filled 2016-08-20: qty 10

## 2016-08-20 MED ORDER — SODIUM CHLORIDE 0.9 % IV SOLN
Freq: Once | INTRAVENOUS | Status: AC
  Administered 2016-08-20: 11:00:00 via INTRAVENOUS

## 2016-08-20 MED ORDER — PROCHLORPERAZINE MALEATE 10 MG PO TABS
10.0000 mg | ORAL_TABLET | Freq: Once | ORAL | Status: AC
  Administered 2016-08-20: 10 mg via ORAL

## 2016-08-20 MED ORDER — HEPARIN SOD (PORK) LOCK FLUSH 100 UNIT/ML IV SOLN
500.0000 [IU] | Freq: Once | INTRAVENOUS | Status: AC | PRN
  Administered 2016-08-20: 500 [IU]
  Filled 2016-08-20: qty 5

## 2016-08-20 MED ORDER — SODIUM CHLORIDE 0.9 % IV SOLN
13.7000 mg/kg | Freq: Once | INTRAVENOUS | Status: AC
  Administered 2016-08-20: 1000 mg via INTRAVENOUS
  Filled 2016-08-20: qty 32

## 2016-08-20 MED ORDER — PROCHLORPERAZINE MALEATE 10 MG PO TABS
ORAL_TABLET | ORAL | Status: AC
  Filled 2016-08-20: qty 1

## 2016-08-20 MED ORDER — PEMETREXED DISODIUM CHEMO INJECTION 500 MG
500.0000 mg/m2 | Freq: Once | INTRAVENOUS | Status: AC
  Administered 2016-08-20: 900 mg via INTRAVENOUS
  Filled 2016-08-20: qty 20

## 2016-08-20 NOTE — Patient Instructions (Signed)
[facility] Discharge Instructions for Patients Receiving Chemotherapy  Today you received the following chemotherapy agents:  Avastin and Alimta.  To help prevent nausea and vomiting after your treatment, we encourage you to take your nausea medication as directed.   If you develop nausea and vomiting that is not controlled by your nausea medication, call the clinic.   BELOW ARE SYMPTOMS THAT SHOULD BE REPORTED IMMEDIATELY:  *FEVER GREATER THAN 100.5 F  *CHILLS WITH OR WITHOUT FEVER  NAUSEA AND VOMITING THAT IS NOT CONTROLLED WITH YOUR NAUSEA MEDICATION  *UNUSUAL SHORTNESS OF BREATH  *UNUSUAL BRUISING OR BLEEDING  TENDERNESS IN MOUTH AND THROAT WITH OR WITHOUT PRESENCE OF ULCERS  *URINARY PROBLEMS  *BOWEL PROBLEMS  UNUSUAL RASH Items with * indicate a potential emergency and should be followed up as soon as possible.  Feel free to call the clinic you have any questions or concerns. The clinic phone number is [phone].  Please show the CHEMO ALERT CARD at check-in to the Emergency Department and triage nurse.   

## 2016-08-20 NOTE — Telephone Encounter (Signed)
Gave patient avs report and appointments for April and May.  °

## 2016-08-20 NOTE — Progress Notes (Signed)
Oxygen sat testing done and sent to [facility] with orders for oxygen.

## 2016-08-20 NOTE — Progress Notes (Signed)
[place] [phone]   [phone]  OFFICE PROGRESS NOTE  Maren Reamer, MD [place] [address]  DIAGNOSIS: Stage IV (T3, N0, M1a) non-small cell lung cancer, adenocarcinoma presented with large right lower lobe lung mass, large right pleural effusion with collapse of the right lung and shift of the mediastinum and heart to the left as well as left lung pulmonary nodules diagnosed in August 2017.  FOUNDATION [facility]: Molecular study:  Genomic Alteration Identified? KRAS G12C Additional Findings? Microsatellite status Cannot Be Determined Tumor Mutation Burden Cannot Be Determined Additional Disease-relevant Genes with No Reportable Alterations Identified? EGFR ALK BRAF MET RET ERBB2 ROS1  PRIOR THERAPY: Systemic chemotherapy with carboplatin for AUC of 5, Alimta 500 MG/M2 and Avastin 15 MG/KG every 3 weeks. Status post 6 cycles. First cycle was given 03/02/2016. Last dose was given 06/30/2016 with partial response.  CURRENT THERAPY: Maintenance systemic chemotherapy with Alimta 500 MG/M2 and Avastin 15 MG/KG every 3 weeks. First dose 07/30/2016.  INTERVAL HISTORY: Roger Barnes 56 y.o. male came to the clinic today for follow-up visit. The patient is currently undergoing maintenance treatment with Alimta and Avastin status post 1 cycle and tolerated the first cycle of his treatment well. He denied having any chest pain but continues to have shortness of breath at baseline and increased with exertion. He has no significant weight loss or night sweats. He has no nausea, vomiting, diarrhea or constipation. He is here today for evaluation before starting cycle #2.  MEDICAL HISTORY: Past Medical History:  Diagnosis Date  . Adenocarcinoma of right lung, stage 4 ([place])   . Encounter for antineoplastic chemotherapy 02/26/2016  . Goals of care, counseling/discussion 07/23/2016  . Pleural effusion, right 01/25/2016  .  Tachycardia     ALLERGIES:  is allergic to no known allergies.  MEDICATIONS:  Current Outpatient Prescriptions  Medication Sig Dispense Refill  . acetaminophen (TYLENOL) 325 MG tablet Take 2 tablets (650 mg total) by mouth every 6 (six) hours as needed for mild pain (or Fever >/= 101). 10 tablet 0  . dexamethasone (DECADRON) 4 MG tablet 4 mg by mouth twice a day the day before, day of and day after the chemotherapy every 3 weeks 40 tablet 1  . feeding supplement, ENSURE ENLIVE, (ENSURE ENLIVE) LIQD Take 237 mLs by mouth 2 (two) times daily between meals. 811 mL 12  . folic acid (FOLVITE) 1 MG tablet Take 1 tablet (1 mg total) by mouth daily. 90 tablet 3  . levalbuterol (XOPENEX) 0.63 MG/3ML nebulizer solution Take 3 mLs (0.63 mg total) by nebulization 2 (two) times daily. 3 mL 12  . lidocaine-prilocaine (EMLA) cream Apply 1 application topically as needed. 30 g 0  . metoprolol tartrate (LOPRESSOR) 25 MG tablet Take 1 tablet (25 mg total) by mouth 2 (two) times daily. 60 tablet 12  . Multiple Vitamins-Minerals (MULTIVITAMIN WITH MINERALS) tablet Take 1 tablet by mouth daily.    . polyethylene glycol (MIRALAX / GLYCOLAX) packet Take 17 g by mouth daily as needed for mild constipation. 14 each 0  . prochlorperazine (COMPAZINE) 10 MG tablet Take 1 tablet (10 mg total) by mouth every 6 (six) hours as needed for nausea or vomiting. 30 tablet 0  . senna-docusate (SENOKOT S) 8.6-50 MG tablet Take 1-2 tablets by mouth daily as needed for mild constipation.    Marland Kitchen HYDROcodone-acetaminophen (NORCO/VICODIN) 5-325 MG tablet Take 1-2 tablets by mouth every 4 (four) hours as needed  for moderate pain. (Patient not taking: Reported on 08/20/2016) 45 tablet 0  . traMADol (ULTRAM) 50 MG tablet Take 1 tablet (50 mg total) by mouth every 6 (six) hours as needed (mild pain). (Patient not taking: Reported on 08/20/2016) 30 tablet 1   No current facility-administered medications for this visit.     SURGICAL HISTORY:    Past Surgical History:  Procedure Laterality Date  . CHEST TUBE INSERTION Right 02/01/2016   Procedure: INSERTION PLEURAL DRAINAGE CATHETER;  Surgeon: Ivin Poot, MD;  Location: [facility];  Service: Thoracic;  Laterality: Right;  . PLEURADESIS Right 02/01/2016   Procedure: DOXYCYCLINE PLEURADESIS;  Surgeon: Ivin Poot, MD;  Location: [facility];  Service: Thoracic;  Laterality: Right;  . PORTACATH PLACEMENT Right 03/06/2016   Procedure: INSERTION PORT-A-CATH;  Surgeon: Ivin Poot, MD;  Location: [facility];  Service: Thoracic;  Laterality: Right;  . REMOVAL OF PLEURAL DRAINAGE CATHETER Right 07/08/2016   Procedure: REMOVAL OF PLEURAL DRAINAGE CATHETER;  Surgeon: Ivin Poot, MD;  Location: [facility];  Service: Thoracic;  Laterality: Right;  . THORACENTESIS Right 01/25/2016  . VIDEO ASSISTED THORACOSCOPY Right 02/01/2016   Procedure: VIDEO ASSISTED THORACOSCOPY;  Surgeon: Ivin Poot, MD;  Location: [facility];  Service: Thoracic;  Laterality: Right;  Marland Kitchen VIDEO BRONCHOSCOPY N/A 02/01/2016   Procedure: VIDEO BRONCHOSCOPY;  Surgeon: Ivin Poot, MD;  Location: [facility];  Service: Thoracic;  Laterality: N/A;  . WISDOM TOOTH EXTRACTION      REVIEW OF SYSTEMS:  A comprehensive review of systems was negative except for: Constitutional: positive for fatigue Respiratory: positive for cough and dyspnea on exertion   PHYSICAL EXAMINATION: General appearance: alert, cooperative, fatigued and no distress Head: Normocephalic, without obvious abnormality, atraumatic Neck: no adenopathy, no JVD, supple, symmetrical, trachea midline and thyroid not enlarged, symmetric, no tenderness/mass/nodules Lymph nodes: Cervical, supraclavicular, and axillary nodes normal. Resp: diminished breath sounds RLL and rales RLL Back: symmetric, no curvature. ROM normal. No CVA tenderness. Cardio: regular rate and rhythm, S1, S2 normal, no murmur, click, rub or gallop GI: soft, non-tender; bowel sounds normal; no masses,  no  organomegaly Extremities: extremities normal, atraumatic, no cyanosis or edema  ECOG PERFORMANCE STATUS: 1 - Symptomatic but completely ambulatory  Blood pressure 135/85, pulse (!) 110, temperature 98.5 F (36.9 C), temperature source Oral, resp. rate 19, height _0  (1.778 m), weight 140 lb (63.5 kg), SpO2 91 %.  LABORATORY DATA: Lab Results  Component Value Date   WBC 10.0 08/20/2016   HGB 12.3 (L) 08/20/2016   HCT 36.8 (L) 08/20/2016   MCV 99.4 (H) 08/20/2016   PLT 417 (H) 08/20/2016      Chemistry      Component Value Date/Time   NA 138 08/20/2016 0924   K 4.6 08/20/2016 0924   CL 105 03/05/2016 1206   CO2 25 08/20/2016 0924   BUN 18.0 08/20/2016 0924   CREATININE 1.0 08/20/2016 0924      Component Value Date/Time   CALCIUM 9.9 08/20/2016 0924   ALKPHOS 94 08/20/2016 0924   AST 27 08/20/2016 0924   ALT 15 08/20/2016 0924   BILITOT 0.26 08/20/2016 0924       RADIOGRAPHIC STUDIES: Ct Chest W Contrast  Result Date: 07/21/2016 CLINICAL DATA:  Lung cancer diagnosis September 2017. Ongoing chemotherapy. EXAM: CT CHEST AND ABDOMEN WITH CONTRAST TECHNIQUE: Multidetector CT imaging of the chest and abdomen was performed following the standard protocol during bolus administration of intravenous contrast. CONTRAST:  126m ISOVUE-300 IOPAMIDOL (  ISOVUE-300) INJECTION 61% COMPARISON:  PET-CT 02/22/2016, CT 05/07/2016 Edit FINDINGS: CT CHEST FINDINGS Cardiovascular: Port in the RIGHT chest wall. No pericardial fluid. No central pulmonary embolism. Mediastinum/Nodes: No axillary supraclavicular adenopathy. No mediastinal hilar adenopathy. Esophagus normal. Lungs/Pleura: Large RIGHT hydropneumothorax is stable in volume. The PleurX catheter has been removed. The RIGHT lower lobe mass is similar in size measuring 5.4 by 3.0 cm compared with 5.3 by 3.0 cm. Fine interstitial nodular thickening in the RIGHT upper lobe is unchanged. Discrete peripheral nodules in the LEFT lower lobe which  reaches confluence at the bases appears slightly increased in size. For example 13 mm by 15 mm nodule (image 135, series 4) compares with 12 mm by 10 mm nodule. Adjacent 14 mm nodule on image 144 compares with 11 mm. No new nodules identified. Similar single peripheral nodule in the LEFT upper lobe measuring 8 mm is unchanged from prior. Musculoskeletal: No aggressive osseous lesion. New gas collection within the subcutaneous tissues of the anterior LEFT chest wall (image 79, series 2. There is a gas-filled track along the prior course of the PleurX catheter which extends from the hydropneumothorax to the subcutaneous gas collection. (image 79, series 2 and image 44, series 2. CT ABDOMEN FINDINGS Hepatobiliary: No focal hepatic lesion.  Normal cough Pancreas: Pancreas is normal. No ductal dilatation. No pancreatic inflammation. Spleen: Normal spleen. Adrenals/Urinary Tract: Adrenal glands and kidneys normal. Ureters normal para Stomach/Bowel: Stomach and limited view of the bowel is unremarkable. Vascular/Lymphatic: Upper abdominal aorta normal. No upper abdominal adenopathy. Other: No peritoneal metastasis Musculoskeletal: No skeletal metastasis IMPRESSION: 1. Stable mass within the RIGHT lower lobe. 2. Stable interstitial thickening in the RIGHT upper lobe. 3. Stable large RIGHT hydropneumothorax.  PleurX catheter removed. 4. NEW GAS COLLECTING WITHIN THE SUBCUTANEOUS TISSUE OF THE RIGHT LOWER CHEST WALL. There is a communication between this subcutaneous gas collection and the hydropneumothorax along the previous catheter tract. 5. Within LEFT lung, mild INTERVAL INCREASE in size and confluence of the LEFT lower lobe peripheral nodularity. 6. No evidence of abdominal metastasis. These results will be called to the ordering clinician or representative by the Radiologist Assistant, and communication documented in the PACS or zVision Dashboard. Electronically Signed   By: Suzy Bouchard M.D.   On: 07/21/2016 11:13    Ct Abdomen W Contrast  Result Date: 07/21/2016 CLINICAL DATA:  Lung cancer diagnosis September 2017. Ongoing chemotherapy. EXAM: CT CHEST AND ABDOMEN WITH CONTRAST TECHNIQUE: Multidetector CT imaging of the chest and abdomen was performed following the standard protocol during bolus administration of intravenous contrast. CONTRAST:  130m ISOVUE-300 IOPAMIDOL (ISOVUE-300) INJECTION 61% COMPARISON:  PET-CT 02/22/2016, CT 05/07/2016 Edit FINDINGS: CT CHEST FINDINGS Cardiovascular: Port in the RIGHT chest wall. No pericardial fluid. No central pulmonary embolism. Mediastinum/Nodes: No axillary supraclavicular adenopathy. No mediastinal hilar adenopathy. Esophagus normal. Lungs/Pleura: Large RIGHT hydropneumothorax is stable in volume. The PleurX catheter has been removed. The RIGHT lower lobe mass is similar in size measuring 5.4 by 3.0 cm compared with 5.3 by 3.0 cm. Fine interstitial nodular thickening in the RIGHT upper lobe is unchanged. Discrete peripheral nodules in the LEFT lower lobe which reaches confluence at the bases appears slightly increased in size. For example 13 mm by 15 mm nodule (image 135, series 4) compares with 12 mm by 10 mm nodule. Adjacent 14 mm nodule on image 144 compares with 11 mm. No new nodules identified. Similar single peripheral nodule in the LEFT upper lobe measuring 8 mm is unchanged from prior. Musculoskeletal: No  aggressive osseous lesion. New gas collection within the subcutaneous tissues of the anterior LEFT chest wall (image 79, series 2. There is a gas-filled track along the prior course of the PleurX catheter which extends from the hydropneumothorax to the subcutaneous gas collection. (image 79, series 2 and image 44, series 2. CT ABDOMEN FINDINGS Hepatobiliary: No focal hepatic lesion.  Normal cough Pancreas: Pancreas is normal. No ductal dilatation. No pancreatic inflammation. Spleen: Normal spleen. Adrenals/Urinary Tract: Adrenal glands and kidneys normal. Ureters  normal para Stomach/Bowel: Stomach and limited view of the bowel is unremarkable. Vascular/Lymphatic: Upper abdominal aorta normal. No upper abdominal adenopathy. Other: No peritoneal metastasis Musculoskeletal: No skeletal metastasis IMPRESSION: 1. Stable mass within the RIGHT lower lobe. 2. Stable interstitial thickening in the RIGHT upper lobe. 3. Stable large RIGHT hydropneumothorax.  PleurX catheter removed. 4. NEW GAS COLLECTING WITHIN THE SUBCUTANEOUS TISSUE OF THE RIGHT LOWER CHEST WALL. There is a communication between this subcutaneous gas collection and the hydropneumothorax along the previous catheter tract. 5. Within LEFT lung, mild INTERVAL INCREASE in size and confluence of the LEFT lower lobe peripheral nodularity. 6. No evidence of abdominal metastasis. These results will be called to the ordering clinician or representative by the Radiologist Assistant, and communication documented in the PACS or zVision Dashboard. Electronically Signed   By: Suzy Bouchard M.D.   On: 07/21/2016 11:13    ASSESSMENT AND PLAN:  This is a very pleasant 56 years old white male with stage IV non-small cell lung cancer, adenocarcinoma with no actionable mutations. He was started on induction systemic chemotherapy with carboplatin, Alimta and Avastin status post 6 cycles and tolerated his treatment well with partial response. He is currently undergoing maintenance treatment with Alimta and Avastin status post 1 cycle. He tolerated the first cycle of this treatment well with no significant adverse effects. I recommended for the patient to proceed with cycle #2 today as scheduled. Will test his oxygen saturation with exercise and if needed well order home oxygen for the patient. He would come back for follow-up visit in 3 weeks for evaluation before starting cycle #3. The patient was advised to call immediately if she has any concerning symptoms in the interval. The patient voices understanding of current  disease status and treatment options and is in agreement with the current care plan.  All questions were answered. The patient knows to call the clinic with any problems, questions or concerns. We can certainly see the patient much sooner if necessary. I spent 10 minutes counseling the patient face to face. The total time spent in the appointment was 15 minutes.  Disclaimer: This note was dictated with voice recognition software. Similar sounding words can inadvertently be transcribed and may not be corrected upon review.

## 2016-08-20 NOTE — Progress Notes (Signed)
Ok to treat with HR 102 per Arnoldo Lenis RN, per MD [facility]

## 2016-09-05 ENCOUNTER — Other Ambulatory Visit: Payer: Self-pay | Admitting: Pharmacist

## 2016-09-05 DIAGNOSIS — Z1211 Encounter for screening for malignant neoplasm of colon: Secondary | ICD-10-CM

## 2016-09-08 ENCOUNTER — Inpatient Hospital Stay (HOSPITAL_COMMUNITY)
Admission: EM | Admit: 2016-09-08 | Discharge: 2016-09-09 | DRG: 186 | Disposition: A | Discharge status: Home / Self Care | Payer: Medicaid Other | Attending: Internal Medicine | Admitting: Internal Medicine

## 2016-09-08 ENCOUNTER — Encounter (HOSPITAL_COMMUNITY): Payer: Self-pay | Admitting: Emergency Medicine

## 2016-09-08 ENCOUNTER — Telehealth: Payer: Self-pay | Admitting: Medical Oncology

## 2016-09-08 ENCOUNTER — Ambulatory Visit: Payer: Medicaid Other | Admitting: Internal Medicine

## 2016-09-08 ENCOUNTER — Emergency Department (HOSPITAL_COMMUNITY): Payer: Medicaid Other

## 2016-09-08 DIAGNOSIS — Z87891 Personal history of nicotine dependence: Secondary | ICD-10-CM

## 2016-09-08 DIAGNOSIS — F419 Anxiety disorder, unspecified: Secondary | ICD-10-CM | POA: Diagnosis present

## 2016-09-08 DIAGNOSIS — R0602 Shortness of breath: Secondary | ICD-10-CM

## 2016-09-08 DIAGNOSIS — L8990 Pressure ulcer of unspecified site, unspecified stage: Secondary | ICD-10-CM | POA: Insufficient documentation

## 2016-09-08 DIAGNOSIS — Z9981 Dependence on supplemental oxygen: Secondary | ICD-10-CM

## 2016-09-08 DIAGNOSIS — Z800 Family history of malignant neoplasm of digestive organs: Secondary | ICD-10-CM

## 2016-09-08 DIAGNOSIS — Z681 Body mass index (BMI) 19 or less, adult: Secondary | ICD-10-CM | POA: Diagnosis not present

## 2016-09-08 DIAGNOSIS — J948 Other specified pleural conditions: Principal | ICD-10-CM | POA: Diagnosis present

## 2016-09-08 DIAGNOSIS — J90 Pleural effusion, not elsewhere classified: Secondary | ICD-10-CM

## 2016-09-08 DIAGNOSIS — R64 Cachexia: Secondary | ICD-10-CM | POA: Diagnosis present

## 2016-09-08 DIAGNOSIS — Z9221 Personal history of antineoplastic chemotherapy: Secondary | ICD-10-CM | POA: Diagnosis not present

## 2016-09-08 DIAGNOSIS — R000 Tachycardia, unspecified: Secondary | ICD-10-CM | POA: Diagnosis not present

## 2016-09-08 DIAGNOSIS — C3491 Malignant neoplasm of unspecified part of right bronchus or lung: Secondary | ICD-10-CM | POA: Diagnosis present

## 2016-09-08 DIAGNOSIS — Z85118 Personal history of other malignant neoplasm of bronchus and lung: Secondary | ICD-10-CM

## 2016-09-08 DIAGNOSIS — Z836 Family history of other diseases of the respiratory system: Secondary | ICD-10-CM | POA: Diagnosis not present

## 2016-09-08 DIAGNOSIS — C3490 Malignant neoplasm of unspecified part of unspecified bronchus or lung: Secondary | ICD-10-CM

## 2016-09-08 DIAGNOSIS — E43 Unspecified severe protein-calorie malnutrition: Secondary | ICD-10-CM | POA: Insufficient documentation

## 2016-09-08 DIAGNOSIS — R0600 Dyspnea, unspecified: Secondary | ICD-10-CM

## 2016-09-08 HISTORY — DX: Dyspnea, unspecified: R06.00

## 2016-09-08 HISTORY — DX: Anxiety disorder, unspecified: F41.9

## 2016-09-08 LAB — COMPREHENSIVE METABOLIC PANEL
ALBUMIN: 2.6 g/dL — AB (ref 3.5–5.0)
ALT: 57 U/L (ref 17–63)
ANION GAP: 10 (ref 5–15)
AST: 63 U/L — AB (ref 15–41)
Alkaline Phosphatase: 105 U/L (ref 38–126)
BILIRUBIN TOTAL: 0.4 mg/dL (ref 0.3–1.2)
BUN: 14 mg/dL (ref 6–20)
CHLORIDE: 99 mmol/L — AB (ref 101–111)
CO2: 29 mmol/L (ref 22–32)
Calcium: 9.3 mg/dL (ref 8.9–10.3)
Creatinine, Ser: 0.82 mg/dL (ref 0.61–1.24)
GFR calc Af Amer: >60 mL/min (ref >60)
GFR calc non Af Amer: >60 mL/min (ref >60)
GLUCOSE: 118 mg/dL — AB (ref 65–99)
POTASSIUM: 4.2 mmol/L (ref 3.5–5.1)
Sodium: 138 mmol/L (ref 135–145)
TOTAL PROTEIN: 6.5 g/dL (ref 6.5–8.1)

## 2016-09-08 LAB — CBC
HEMATOCRIT: 37.8 % — AB (ref 39.0–52.0)
Hemoglobin: 12.1 g/dL — ABNORMAL LOW (ref 13.0–17.0)
MCH: 31 pg (ref 26.0–34.0)
MCHC: 32 g/dL (ref 30.0–36.0)
MCV: 96.9 fL (ref 78.0–100.0)
PLATELETS: 392 10*3/uL (ref 150–400)
RBC: 3.9 MIL/uL — ABNORMAL LOW (ref 4.22–5.81)
RDW: 16 % — AB (ref 11.5–15.5)
WBC: 11 10*3/uL — AB (ref 4.0–10.5)

## 2016-09-08 MED ORDER — ACETAMINOPHEN 325 MG PO TABS
650.0000 mg | ORAL_TABLET | Freq: Four times a day (QID) | ORAL | Status: DC | PRN
  Administered 2016-09-08: 325 mg via ORAL
  Filled 2016-09-08: qty 2

## 2016-09-08 MED ORDER — ONDANSETRON HCL 4 MG/2ML IJ SOLN: 4.0000 mg | Freq: Four times a day (QID) | INTRAMUSCULAR | Status: DC | PRN

## 2016-09-08 MED ORDER — POLYETHYLENE GLYCOL 3350 17 G PO PACK: 17.0000 g | PACK | Freq: Every day | ORAL | Status: DC | PRN

## 2016-09-08 MED ORDER — LEVALBUTEROL HCL 0.63 MG/3ML IN NEBU
0.6300 mg | INHALATION_SOLUTION | Freq: Two times a day (BID) | RESPIRATORY_TRACT | Status: DC
  Administered 2016-09-08 – 2016-09-09 (×2): 0.63 mg via RESPIRATORY_TRACT
  Filled 2016-09-08 (×2): qty 3

## 2016-09-08 MED ORDER — SODIUM CHLORIDE 0.9 % IV SOLN
INTRAVENOUS | Status: DC
Start: 2016-09-08 — End: 2016-09-09

## 2016-09-08 MED ORDER — IPRATROPIUM BROMIDE 0.02 % IN SOLN
0.5000 mg | Freq: Once | RESPIRATORY_TRACT | Status: AC
  Administered 2016-09-08: 0.5 mg via RESPIRATORY_TRACT
  Filled 2016-09-08: qty 2.5

## 2016-09-08 MED ORDER — ENSURE ENLIVE PO LIQD: 237.0000 mL | Freq: Two times a day (BID) | ORAL | Status: DC | PRN

## 2016-09-08 MED ORDER — TRAMADOL HCL 50 MG PO TABS: 50.0000 mg | ORAL_TABLET | Freq: Four times a day (QID) | ORAL | Status: DC | PRN

## 2016-09-08 MED ORDER — ENOXAPARIN SODIUM 40 MG/0.4ML ~~LOC~~ SOLN
40.0000 mg | SUBCUTANEOUS | Status: DC
  Administered 2016-09-08: 40 mg via SUBCUTANEOUS
  Filled 2016-09-08: qty 0.4

## 2016-09-08 MED ORDER — ADULT MULTIVITAMIN W/MINERALS CH
1.0000 | ORAL_TABLET | Freq: Every day | ORAL | Status: DC
  Administered 2016-09-09: 1 via ORAL
  Filled 2016-09-08 (×3): qty 1

## 2016-09-08 MED ORDER — METOPROLOL TARTRATE 25 MG PO TABS
25.0000 mg | ORAL_TABLET | Freq: Two times a day (BID) | ORAL | Status: DC
  Administered 2016-09-08 – 2016-09-09 (×2): 25 mg via ORAL
  Filled 2016-09-08 (×2): qty 1

## 2016-09-08 MED ORDER — HYDROCODONE-ACETAMINOPHEN 5-325 MG PO TABS: 1.0000 | ORAL_TABLET | ORAL | Status: DC | PRN

## 2016-09-08 MED ORDER — SODIUM CHLORIDE 0.9% FLUSH
3.0000 mL | Freq: Two times a day (BID) | INTRAVENOUS | Status: DC
  Administered 2016-09-08: 3 mL via INTRAVENOUS

## 2016-09-08 MED ORDER — ALBUTEROL SULFATE (2.5 MG/3ML) 0.083% IN NEBU
5.0000 mg | INHALATION_SOLUTION | Freq: Once | RESPIRATORY_TRACT | Status: AC
  Administered 2016-09-08: 5 mg via RESPIRATORY_TRACT
  Filled 2016-09-08: qty 6

## 2016-09-08 MED ORDER — ALBUTEROL SULFATE (2.5 MG/3ML) 0.083% IN NEBU: 2.5000 mg | INHALATION_SOLUTION | RESPIRATORY_TRACT | Status: DC | PRN

## 2016-09-08 MED ORDER — FOLIC ACID 1 MG PO TABS
1.0000 mg | ORAL_TABLET | Freq: Every day | ORAL | Status: DC
  Administered 2016-09-09: 1 mg via ORAL
  Filled 2016-09-08 (×2): qty 1

## 2016-09-08 MED ORDER — ONDANSETRON HCL 4 MG PO TABS: 4.0000 mg | ORAL_TABLET | Freq: Four times a day (QID) | ORAL | Status: DC | PRN

## 2016-09-08 MED ORDER — SENNOSIDES-DOCUSATE SODIUM 8.6-50 MG PO TABS: 1.0000 | ORAL_TABLET | Freq: Every day | ORAL | Status: DC | PRN

## 2016-09-08 NOTE — H&P (Signed)
History and Physical    Roger Barnes QBH:[id number] DOB: 1961-03-26 DOA: 09/08/2016  Referring MD/NP/PA:  PCP: Maren Reamer, MD Outpatient Specialists:  Patient coming from:   Chief Complaint:   HPI: Roger Barnes is a 56 y.o. male with medical history significant of stage 4 lung cancer with recurrent hydothorax and pleural effusions.  He had a previous pleurex placed by Dr. Nils Pyle.  It was removed after he went his last cycle of chemo as it was not draining per patient.  Patient follows with Dr. Julien Nordmann.  He has been getting progressively short of breath over 2 weeks but much worse over the last 24 hours.  He is c/o Heart racing and difficulty speaking.  He said his O2 sats were in the 70s at home (recently placed on home O2).   No fever, no chills.  In the ER, x ray was done that showed similar right effusions but worsening left sided effusions.  ER physician spoke with thoracic surgery who will see patient in consultation.  Patient did feel slight better after nebulization.     Review of Systems: all systems reviewed, negative unless stated above in HPI   Past Medical History:  Diagnosis Date  . Adenocarcinoma of right lung, stage 4 ([place])   . Encounter for antineoplastic chemotherapy 02/26/2016  . Goals of care, counseling/discussion 07/23/2016  . Pleural effusion, right 01/25/2016  . Tachycardia     Past Surgical History:  Procedure Laterality Date  . CHEST TUBE INSERTION Right 02/01/2016   Procedure: INSERTION PLEURAL DRAINAGE CATHETER;  Surgeon: Ivin Poot, MD;  Location: [place];  Service: Thoracic;  Laterality: Right;  . PLEURADESIS Right 02/01/2016   Procedure: DOXYCYCLINE PLEURADESIS;  Surgeon: Ivin Poot, MD;  Location: [place];  Service: Thoracic;  Laterality: Right;  . PORTACATH PLACEMENT Right 03/06/2016   Procedure: INSERTION PORT-A-CATH;  Surgeon: Ivin Poot, MD;  Location: [place];  Service: Thoracic;  Laterality: Right;  . REMOVAL OF PLEURAL  DRAINAGE CATHETER Right 07/08/2016   Procedure: REMOVAL OF PLEURAL DRAINAGE CATHETER;  Surgeon: Ivin Poot, MD;  Location: [place];  Service: Thoracic;  Laterality: Right;  . THORACENTESIS Right 01/25/2016  . VIDEO ASSISTED THORACOSCOPY Right 02/01/2016   Procedure: VIDEO ASSISTED THORACOSCOPY;  Surgeon: Ivin Poot, MD;  Location: [place];  Service: Thoracic;  Laterality: Right;  Marland Kitchen VIDEO BRONCHOSCOPY N/A 02/01/2016   Procedure: VIDEO BRONCHOSCOPY;  Surgeon: Ivin Poot, MD;  Location: [place];  Service: Thoracic;  Laterality: N/A;  . WISDOM TOOTH EXTRACTION       reports that he has quit smoking. He has never used smokeless tobacco. He reports that he does not drink alcohol or use drugs.  No Active Allergies  Family History  Problem Relation Age of Onset  . Cancer Father     oral  . COPD Maternal Grandmother     Prior to Admission medications   Medication Sig Start Date End Date Taking? Authorizing Provider  acetaminophen (TYLENOL) 325 MG tablet Take 2 tablets (650 mg total) by mouth every 6 (six) hours as needed for mild pain (or Fever >/= 101). 02/05/16  Yes Belkys A Regalado, MD  dexamethasone (DECADRON) 4 MG tablet 4 mg by mouth twice a day the day before, day of and day after the chemotherapy every 3 weeks 02/26/16  Yes Curt Bears, MD  feeding supplement, ENSURE ENLIVE, (ENSURE ENLIVE) LIQD Take 237 mLs by mouth 2 (two) times daily between meals. Patient taking differently:  Take 237 mLs by mouth 2 (two) times daily as needed (takes occasional).  02/05/16  Yes Belkys A Regalado, MD  folic acid (FOLVITE) 1 MG tablet Take 1 tablet (1 mg total) by mouth daily. 08/07/16  Yes Curt Bears, MD  HYDROcodone-acetaminophen (NORCO/VICODIN) 5-325 MG tablet Take 1-2 tablets by mouth every 4 (four) hours as needed for moderate pain. 06/09/16  Yes Maren Reamer, MD  levalbuterol (XOPENEX) 0.63 MG/3ML nebulizer solution Take 3 mLs (0.63 mg total) by nebulization 2 (two) times daily. 03/10/16   Yes Tiffany Daneil Dan, PA-C  lidocaine-prilocaine (EMLA) cream Apply 1 application topically as needed. 02/26/16  Yes Curt Bears, MD  metoprolol tartrate (LOPRESSOR) 25 MG tablet Take 1 tablet (25 mg total) by mouth 2 (two) times daily. 03/10/16  Yes Tiffany Daneil Dan, PA-C  Multiple Vitamins-Minerals (MULTIVITAMIN WITH MINERALS) tablet Take 1 tablet by mouth daily.   Yes Historical Provider, MD  polyethylene glycol (MIRALAX / GLYCOLAX) packet Take 17 g by mouth daily as needed for mild constipation. 02/05/16  Yes Belkys A Regalado, MD  prochlorperazine (COMPAZINE) 10 MG tablet Take 1 tablet (10 mg total) by mouth every 6 (six) hours as needed for nausea or vomiting. 02/26/16  Yes Curt Bears, MD  senna-docusate (SENOKOT S) 8.6-50 MG tablet Take 1-2 tablets by mouth daily as needed for mild constipation.   Yes Historical Provider, MD  traMADol (ULTRAM) 50 MG tablet Take 1 tablet (50 mg total) by mouth every 6 (six) hours as needed (mild pain). 06/09/16  Yes Maren Reamer, MD    Physical Exam: Vitals:   09/08/16 1545 09/08/16 1600 09/08/16 1615 09/08/16 1630  BP: 109/81 117/83 127/85 (!) 123/97  Pulse: (!) 109 (!) 116 (!) 115 (!) 115  Resp:    (!) 25  Temp:      TempSrc:      SpO2: 97% 95% 96% 96%  Weight:          Constitutional: increased work of breathing, cachectic  Vitals:   09/08/16 1545 09/08/16 1600 09/08/16 1615 09/08/16 1630  BP: 109/81 117/83 127/85 (!) 123/97  Pulse: (!) 109 (!) 116 (!) 115 (!) 115  Resp:    (!) 25  Temp:      TempSrc:      SpO2: 97% 95% 96% 96%  Weight:       Eyes: PERRL, lids and conjunctivae normal ENMT: Mucous membranes are moist. Posterior pharynx clear of any exudate or lesions.Normal dentition.  Neck: normal, supple, no masses, no thyromegaly Respiratory: diminished with accessory muscle use.  Cardiovascular: Regular rate and rhythm, no murmurs / rubs / gallops. No extremity edema. 2+ pedal pulses. No carotid bruits.  Abdomen: no tenderness,  no masses palpated. No hepatosplenomegaly. Bowel sounds positive.  Musculoskeletal: no clubbing / cyanosis. No joint deformity upper and lower extremities. Good ROM, no contractures. Normal muscle tone.  Skin: no rashes, lesions, ulcers. No induration Neurologic: CN 2-12 grossly intact. Sensation intact, DTR normal. Strength 5/5 in all 4.  Psychiatric: Normal judgment and insight. Alert and oriented x 3. Normal mood.      Labs on Admission: I have personally reviewed following labs and imaging studies  CBC:  Recent Labs Lab 09/08/16 1435  WBC 11.0*  HGB 12.1*  HCT 37.8*  MCV 96.9  PLT 509   Basic Metabolic Panel:  Recent Labs Lab 09/08/16 1435  NA 138  K 4.2  CL 99*  CO2 29  GLUCOSE 118*  BUN 14  CREATININE 0.82  CALCIUM  9.3   GFR: Estimated Creatinine Clearance: 86 mL/min (by C-G formula based on SCr of 0.82 mg/dL). Liver Function Tests:  Recent Labs Lab 09/08/16 1435  AST 63*  ALT 57  ALKPHOS 105  BILITOT 0.4  PROT 6.5  ALBUMIN 2.6*   No results for input(s): LIPASE, AMYLASE in the last 168 hours. No results for input(s): AMMONIA in the last 168 hours. Coagulation Profile: No results for input(s): INR, PROTIME in the last 168 hours. Cardiac Enzymes: No results for input(s): CKTOTAL, CKMB, CKMBINDEX, TROPONINI in the last 168 hours. BNP (last 3 results) No results for input(s): PROBNP in the last 8760 hours. HbA1C: No results for input(s): HGBA1C in the last 72 hours. CBG: No results for input(s): GLUCAP in the last 168 hours. Lipid Profile: No results for input(s): CHOL, HDL, LDLCALC, TRIG, CHOLHDL, LDLDIRECT in the last 72 hours. Thyroid Function Tests: No results for input(s): TSH, T4TOTAL, FREET4, T3FREE, THYROIDAB in the last 72 hours. Anemia Panel: No results for input(s): VITAMINB12, FOLATE, FERRITIN, TIBC, IRON, RETICCTPCT in the last 72 hours. Urine analysis:    Component Value Date/Time   COLORURINE YELLOW 01/31/2016 1401    APPEARANCEUR CLEAR 01/31/2016 1401   LABSPEC 1.024 01/31/2016 1401   PHURINE 5.5 01/31/2016 1401   GLUCOSEU NEGATIVE 01/31/2016 1401   HGBUR NEGATIVE 01/31/2016 1401   BILIRUBINUR NEGATIVE 01/31/2016 1401   KETONESUR NEGATIVE 01/31/2016 1401   PROTEINUR < 30 08/20/2016 0925   NITRITE NEGATIVE 01/31/2016 1401   LEUKOCYTESUR NEGATIVE 01/31/2016 1401   Sepsis Labs: Invalid input(s): PROCALCITONIN, LACTICIDVEN No results found for this or any previous visit (from the past 240 hour(s)).   Radiological Exams on Admission: Dg Chest 2 View  Result Date: 09/08/2016 CLINICAL DATA:  Two weeks in exertional chest pain. History of large cell lung malignancy. Patient is exhibiting tachycardia. EXAM: CHEST  2 VIEW COMPARISON:  CT scan of the chest of July 21, 2016 and chest x-ray of July 18, 2016 FINDINGS: There is a right-sided hydropneumothorax occupying at least 50% of the lung volume. This has not significantly changed since the previous study. There is mild displacement of the mediastinum toward the left. The interstitial markings throughout the left lung are increased as compared to the previous study. The heart is normal in size. The pulmonary vascularity is not engorged. Evening. [facility]ard [facility] Radiology contrast thickened Dr. spinal of about patient we and [place] with left IMPRESSION: Stable appearance of the large right-sided hydropneumothorax occupying at least 50% of the lung volume. Significantly increased interstitial markings throughout the left lung which may reflect pulmonary edema, lymphangitis spread of malignancy, or interstitial pneumonia in the appropriate setting. Findings were discussed by telephone with Dr. Ashok Cordia at the conclusion of the study. Electronically Signed   By: David  Martinique M.D.   On: 09/08/2016 14:57    EKG: Independently reviewed. Sinus tachy  Assessment/Plan Active Problems:   Dyspnea   Pleural effusion   Adenocarcinoma of right lung, stage 4 ([facility])    Tachycardia   Dyspnea due to large right sided hydropneumothorax and left sided possible lymphangitis -CVTS consult for possible drain -Dr. Julien Nordmann added to treatment team  Tachycardia -resume BB -suspect will improve with drainage of lungs  Adenocarcinoma of the lung -added Dr. Julien Nordmann to treatment team   DVT prophylaxis: lovenox Code Status: full-- long discussion with patient-he feels he has more fight in him- may need palliative care consult but will defer to Dr. Julien Nordmann Family Communication: patient Disposition Plan: Consults called: CVTS Admission status:  tele inpt-- suspect patient will be in hospital > 2 midnights due to tachypnea and tachycardia   [facility] Pager [phone]  If 7PM-7AM, please contact night-coverage [email or web address] Password [email or web address]  09/08/2016, 5:01 PM

## 2016-09-08 NOTE — Telephone Encounter (Signed)
Calling EMS now , weak as water , dizzy and heart rate up to 130.

## 2016-09-08 NOTE — ED Triage Notes (Signed)
Pt arrives via EMS from home with 2 weeks of exertional SOB, hx of large cell lung CA. Pt also with tachycardia and weakness. Pt alert, oriented x4, speaking in full sentences. Pt reports sats at home in 70s while walking, noted HR 130s feels fatigued. Pale, malnurished appearance. Wears home O2 at 2L.

## 2016-09-08 NOTE — ED Provider Notes (Addendum)
[facility] [place] DEPT Provider Note   CSN: [id number] Arrival date & time: 09/08/16  1347     History   Chief Complaint Chief Complaint  Patient presents with  . Shortness of Breath  . Tachycardia    HPI NASSER KU is a 56 y.o. male.  Patient with hx stage IV lung cancer, c/o increased sob in the past week. Symptoms gradual onset, persistent, worsening, mod-severe. Dyspnea worse w even minimal activity.  States had right chest catheter a couple months ago for recurrent pleural effusion - states his doctors awake that fluid is reaccumulating.  Denies increased cough. No fever or chills. No exertional cp or discomfort. No increased leg edema, or leg pain.    The history is provided by the patient.  Shortness of Breath  Pertinent negatives include no fever, no headaches, no sore throat, no neck pain, no chest pain, no vomiting, no abdominal pain, no rash and no leg swelling.    Past Medical History:  Diagnosis Date  . Adenocarcinoma of right lung, stage 4 ([place])   . Encounter for antineoplastic chemotherapy 02/26/2016  . Goals of care, counseling/discussion 07/23/2016  . Pleural effusion, right 01/25/2016  . Tachycardia     Patient Active Problem List   Diagnosis Date Noted  . Goals of care, counseling/discussion 07/23/2016  . Antineoplastic chemotherapy induced anemia 05/07/2016  . Stuffy and runny nose 05/07/2016  . Encounter for antineoplastic chemotherapy 02/26/2016  . Adenocarcinoma of right lung, stage 4 ([place])   . Acute respiratory failure with hypoxia ([place])   . Atelectasis   . Pleural effusion 01/25/2016  . Elevated d-dimer 01/25/2016  . Abnormal EKG 01/25/2016  . Pleural effusion, right   . Dyspnea     Past Surgical History:  Procedure Laterality Date  . CHEST TUBE INSERTION Right 02/01/2016   Procedure: INSERTION PLEURAL DRAINAGE CATHETER;  Surgeon: Ivin Poot, MD;  Location: [place];  Service: Thoracic;  Laterality: Right;  . PLEURADESIS Right 02/01/2016    Procedure: DOXYCYCLINE PLEURADESIS;  Surgeon: Ivin Poot, MD;  Location: [place];  Service: Thoracic;  Laterality: Right;  . PORTACATH PLACEMENT Right 03/06/2016   Procedure: INSERTION PORT-A-CATH;  Surgeon: Ivin Poot, MD;  Location: [place];  Service: Thoracic;  Laterality: Right;  . REMOVAL OF PLEURAL DRAINAGE CATHETER Right 07/08/2016   Procedure: REMOVAL OF PLEURAL DRAINAGE CATHETER;  Surgeon: Ivin Poot, MD;  Location: [place];  Service: Thoracic;  Laterality: Right;  . THORACENTESIS Right 01/25/2016  . VIDEO ASSISTED THORACOSCOPY Right 02/01/2016   Procedure: VIDEO ASSISTED THORACOSCOPY;  Surgeon: Ivin Poot, MD;  Location: [place];  Service: Thoracic;  Laterality: Right;  Marland Kitchen VIDEO BRONCHOSCOPY N/A 02/01/2016   Procedure: VIDEO BRONCHOSCOPY;  Surgeon: Ivin Poot, MD;  Location: [facility] OR;  Service: Thoracic;  Laterality: N/A;  . WISDOM TOOTH EXTRACTION         Home Medications    Prior to Admission medications   Medication Sig Start Date End Date Taking? Authorizing Provider  acetaminophen (TYLENOL) 325 MG tablet Take 2 tablets (650 mg total) by mouth every 6 (six) hours as needed for mild pain (or Fever >/= 101). 02/05/16   Belkys A Regalado, MD  dexamethasone (DECADRON) 4 MG tablet 4 mg by mouth twice a day the day before, day of and day after the chemotherapy every 3 weeks 02/26/16   Curt Bears, MD  feeding supplement, ENSURE ENLIVE, (ENSURE ENLIVE) LIQD Take 237 mLs by mouth 2 (two) times daily between meals. 02/05/16  Belkys A Regalado, MD  folic acid (FOLVITE) 1 MG tablet Take 1 tablet (1 mg total) by mouth daily. 08/07/16   Curt Bears, MD  HYDROcodone-acetaminophen (NORCO/VICODIN) 5-325 MG tablet Take 1-2 tablets by mouth every 4 (four) hours as needed for moderate pain. Patient not taking: Reported on 08/20/2016 06/09/16   Maren Reamer, MD  levalbuterol Penne Lash) 0.63 MG/3ML nebulizer solution Take 3 mLs (0.63 mg total) by nebulization 2 (two) times daily.  03/10/16   Brayton Caves, PA-C  lidocaine-prilocaine (EMLA) cream Apply 1 application topically as needed. 02/26/16   Curt Bears, MD  metoprolol tartrate (LOPRESSOR) 25 MG tablet Take 1 tablet (25 mg total) by mouth 2 (two) times daily. 03/10/16   Brayton Caves, PA-C  Multiple Vitamins-Minerals (MULTIVITAMIN WITH MINERALS) tablet Take 1 tablet by mouth daily.    Historical Provider, MD  polyethylene glycol (MIRALAX / GLYCOLAX) packet Take 17 g by mouth daily as needed for mild constipation. 02/05/16   Belkys A Regalado, MD  prochlorperazine (COMPAZINE) 10 MG tablet Take 1 tablet (10 mg total) by mouth every 6 (six) hours as needed for nausea or vomiting. 02/26/16   Curt Bears, MD  senna-docusate (SENOKOT S) 8.6-50 MG tablet Take 1-2 tablets by mouth daily as needed for mild constipation.    Historical Provider, MD  traMADol (ULTRAM) 50 MG tablet Take 1 tablet (50 mg total) by mouth every 6 (six) hours as needed (mild pain). Patient not taking: Reported on 08/20/2016 06/09/16   Maren Reamer, MD    Family History Family History  Problem Relation Age of Onset  . Cancer Father     oral  . COPD Maternal Grandmother     Social History Social History  Substance Use Topics  . Smoking status: Former Research scientist (life sciences)  . Smokeless tobacco: Never Used     Comment: 01/25/2016 "haven't smoked since <2007"  . Alcohol use No     Allergies   No known allergies   Review of Systems Review of Systems  Constitutional: Negative for fever.  HENT: Negative for sore throat.   Eyes: Negative for redness.  Respiratory: Positive for shortness of breath.   Cardiovascular: Negative for chest pain and leg swelling.  Gastrointestinal: Negative for abdominal pain, diarrhea and vomiting.  Genitourinary: Negative for flank pain.  Musculoskeletal: Negative for back pain and neck pain.  Skin: Negative for rash.  Neurological: Negative for headaches.  Hematological: Does not bruise/bleed easily.    Psychiatric/Behavioral: Negative for confusion.     Physical Exam Updated Vital Signs BP (!) 131/103 (BP Location: Right Arm)   Pulse (!) 126   Temp 98.5 F (36.9 C) (Oral)   Resp (!) 33   Wt 61.2 kg   SpO2 96%   BMI 19.37 kg/m   Physical Exam  Constitutional: He is oriented to person, place, and time.  Very thin/frail appearing. Tachycardic. tachypneic.   HENT:  Mouth/Throat: Oropharynx is clear and moist.  Eyes: Conjunctivae are normal.  Neck: Neck supple. No tracheal deviation present.  Cardiovascular: Regular rhythm, normal heart sounds and intact distal pulses.   Tachycardic. Decreased BS right.   Pulmonary/Chest: Effort normal. No accessory muscle usage. No respiratory distress.  Port site right chest without sign of infection.   Abdominal: Soft. Bowel sounds are normal. He exhibits no distension. There is no tenderness.  Genitourinary:  Genitourinary Comments: No cva tenderness  Musculoskeletal: He exhibits no edema or tenderness.  Neurological: He is alert and oriented to person, place, and time.  Skin: Skin is warm and dry.  Psychiatric: He has a normal mood and affect.  Nursing note and vitals reviewed.    ED Treatments / Results  Labs (all labs ordered are listed, but only abnormal results are displayed) Results for orders placed or performed during the hospital encounter of 09/08/16  CBC  Result Value Ref Range   WBC 11.0 (H) 4.0 - 10.5 K/uL   RBC 3.90 (L) 4.22 - 5.81 MIL/uL   Hemoglobin 12.1 (L) 13.0 - 17.0 g/dL   HCT 37.8 (L) 39.0 - 52.0 %   MCV 96.9 78.0 - 100.0 fL   MCH 31.0 26.0 - 34.0 pg   MCHC 32.0 30.0 - 36.0 g/dL   RDW 16.0 (H) 11.5 - 15.5 %   Platelets 392 150 - 400 K/uL    EKG  EKG Interpretation  Date/Time:  Monday September 08 2016 13:48:33 EDT Ventricular Rate:  124 PR Interval:    QRS Duration: 86 QT Interval:  316 QTC Calculation: 456 R Axis:   46 Text Interpretation:  Sinus tachycardia Probable LVH with secondary repol abnrm  Baseline wander in lead(s) V2 V5 Confirmed by Ashok Cordia  MD, Lennette Bihari ([id number]) on 09/08/2016 2:05:38 PM       Radiology Dg Chest 2 View  Result Date: 09/08/2016 CLINICAL DATA:  Two weeks in exertional chest pain. History of large cell lung malignancy. Patient is exhibiting tachycardia. EXAM: CHEST  2 VIEW COMPARISON:  CT scan of the chest of July 21, 2016 and chest x-ray of July 18, 2016 FINDINGS: There is a right-sided hydropneumothorax occupying at least 50% of the lung volume. This has not significantly changed since the previous study. There is mild displacement of the mediastinum toward the left. The interstitial markings throughout the left lung are increased as compared to the previous study. The heart is normal in size. The pulmonary vascularity is not engorged. Evening. [facility]ard [facility] Radiology contrast thickened Dr. spinal of about patient we and [place] with left IMPRESSION: Stable appearance of the large right-sided hydropneumothorax occupying at least 50% of the lung volume. Significantly increased interstitial markings throughout the left lung which may reflect pulmonary edema, lymphangitis spread of malignancy, or interstitial pneumonia in the appropriate setting. Findings were discussed by telephone with Dr. Ashok Cordia at the conclusion of the study. Electronically Signed   By: David  Martinique M.D.   On: 09/08/2016 14:57    Procedures Procedures (including critical care time)  Medications Ordered in ED Medications  0.9 %  sodium chloride infusion (not administered)     Initial Impression / Assessment and Plan / ED Course  I have reviewed the triage vital signs and the nursing notes.  Pertinent labs & imaging results that were available during my care of the patient were reviewed by me and considered in my medical decision making (see chart for details).  Iv ns. Continuous pulse ox and monitor. o2 [place]. Labs.   Reviewed nursing notes and prior charts for additional history.    Diminished bs right, and mild wheezing bil. Albuterol and atrovent neb.  Given dyspnea, hypoxia, will need admit -  Unassigned medicine consulted for admission.  Patient with former pleurx cath for right hydroptx - on todays xray, radiology indicates is approx 50% which they indicate is similar to prior, but that they feel left side is worse, ?lymphangitic spread - will consult thoracic surgery to look at xray to see if they feel they have any tx to offer.   Thoracic surgery consulted - discussed w their  APP - she indicates she will have Dr Cyndia Bent consult on patient.  Discussed w [facility] - they will admit.   Final Clinical Impressions(s) / ED Diagnoses   Final diagnoses:  None    New Prescriptions New Prescriptions   No medications on file             Lajean Saver, MD 09/08/16 1552

## 2016-09-09 DIAGNOSIS — C3490 Malignant neoplasm of unspecified part of unspecified bronchus or lung: Secondary | ICD-10-CM

## 2016-09-09 DIAGNOSIS — L8990 Pressure ulcer of unspecified site, unspecified stage: Secondary | ICD-10-CM | POA: Insufficient documentation

## 2016-09-09 DIAGNOSIS — R000 Tachycardia, unspecified: Secondary | ICD-10-CM

## 2016-09-09 DIAGNOSIS — R0600 Dyspnea, unspecified: Secondary | ICD-10-CM

## 2016-09-09 DIAGNOSIS — E43 Unspecified severe protein-calorie malnutrition: Secondary | ICD-10-CM | POA: Insufficient documentation

## 2016-09-09 LAB — CBC
HEMATOCRIT: 35.2 % — AB (ref 39.0–52.0)
HEMOGLOBIN: 11.1 g/dL — AB (ref 13.0–17.0)
MCH: 30.7 pg (ref 26.0–34.0)
MCHC: 31.5 g/dL (ref 30.0–36.0)
MCV: 97.2 fL (ref 78.0–100.0)
Platelets: 377 10*3/uL (ref 150–400)
RBC: 3.62 MIL/uL — AB (ref 4.22–5.81)
RDW: 16.2 % — ABNORMAL HIGH (ref 11.5–15.5)
WBC: 9.5 10*3/uL (ref 4.0–10.5)

## 2016-09-09 LAB — BASIC METABOLIC PANEL
ANION GAP: 10 (ref 5–15)
BUN: 15 mg/dL (ref 6–20)
CO2: 31 mmol/L (ref 22–32)
Calcium: 9 mg/dL (ref 8.9–10.3)
Chloride: 97 mmol/L — ABNORMAL LOW (ref 101–111)
Creatinine, Ser: 0.83 mg/dL (ref 0.61–1.24)
GFR calc non Af Amer: >60 mL/min (ref >60)
GLUCOSE: 99 mg/dL (ref 65–99)
POTASSIUM: 3.7 mmol/L (ref 3.5–5.1)
Sodium: 138 mmol/L (ref 135–145)

## 2016-09-09 MED ORDER — PRO-STAT SUGAR FREE PO LIQD: 30.0000 mL | Freq: Three times a day (TID) | ORAL | 0 refills | Status: DC

## 2016-09-09 MED ORDER — PRO-STAT SUGAR FREE PO LIQD: 30.0000 mL | Freq: Three times a day (TID) | ORAL | Status: DC

## 2016-09-09 MED ORDER — ENSURE ENLIVE PO LIQD: 237.0000 mL | Freq: Every day | ORAL | Status: DC

## 2016-09-09 MED ORDER — ALPRAZOLAM 0.25 MG PO TABS: 0.5000 mg | ORAL_TABLET | Freq: Two times a day (BID) | ORAL | 0 refills | Status: AC | PRN

## 2016-09-09 NOTE — Discharge Summary (Addendum)
Physician Discharge Summary  Roger Barnes FAO:[id number] DOB: 12/21/1960 DOA: 09/08/2016  PCP: Maren Reamer, MD  Admit date: 09/08/2016 Discharge date: 09/09/2016  Time spent: 45 minutes  Recommendations for Outpatient Follow-up:  Patient will be discharged to home.  Patient will need to follow up with primary care provider within one week of discharge.  Follow up with Dr. Julien Nordmann, oncology. Patient should continue medications as prescribed.  Patient should follow a regular diet.   Discharge Diagnoses:  Dyspnea due to large right sided hydropneumothorax and left sided possible lymphangitis Tachycardia Adenocarcinoma of the lung Anxiety Severe malnutrition   Discharge Condition: Stable  Diet recommendation: regular  Filed Weights   09/08/16 1354 09/08/16 2242  Weight: 61.2 kg (135 lb) 61.2 kg (135 lb)    History of present illness:  On 09/08/2016 by Dr. Eulogio Bear Roger Barnes is a 56 y.o. male with medical history significant of stage 4 lung cancer with recurrent hydothorax and pleural effusions.  He had a previous pleurex placed by Dr. Nils Pyle.  It was removed after he went his last cycle of chemo as it was not draining per patient.  Patient follows with Dr. Julien Nordmann.  He has been getting progressively short of breath over 2 weeks but much worse over the last 24 hours.  He is c/o Heart racing and difficulty speaking.  He said his O2 sats were in the 70s at home (recently placed on home O2).   No fever, no chills.   Hospital Course:  Dyspnea due to large right sided hydropneumothorax and left sided possible lymphangitis -Patient uses home oxygen at baseline -He has had this right sided hydropneumo since Feb 2018 and has been followed by Dr. Nils Pyle. -Received a call from cardiothoracic surgery today stating that patient's films were reviewed by Dr. Cyndia Bent, and no change was noted. It seems that this was relayed to the ED, however, patient was admitted for  shortness of breath and oxygen needs.  -Patient will need to follow up with Dr. Nils Pyle and Dr. Julien Nordmann -Suspect DOE due to patient's Stage IV cancer with a component of anxiety.   Tachycardia -Resolved, continue metoprolol -resume BB -Suspect related to shortness of breath and anxiety  Adenocarcinoma of the lung -Discussed patient with Dr. Julien Nordmann, he has some anxiety. -Continue decadron and follow up in the office.  Anxiety -Will discharge patient with low dose xanax -Upon physical examination, patient begins to hyperventilate intentionally   Severe malnutrition  -Nutrition consulted and recommended supplements  Procedures: None  Consultations: Cardiothoracic surgery Oncology, Dr. Julien Nordmann  Discharge Exam: Vitals:   09/08/16 1957 09/09/16 0518  BP: 116/82 117/87  Pulse: (!) 106 91  Resp: 20 20  Temp: 97.9 F (36.6 C) 98.6 F (37 C)     General: Well developed, cachetic, ill-appearing male, NAD  HEENT: NCAT, mucous membranes moist.  Neck: Supple, no JVD, no masses  Cardiovascular: S1 S2 auscultated, no murmurs, RRR  Respiratory: Diminished breath sounds, R>L. No wheezing. Begins to hyperventilate upon examination  Abdomen: Soft, nontender, nondistended, + bowel sounds  Extremities: warm dry without cyanosis clubbing or edema  Neuro: AAOx3, nonfocal  Psych: Anxious  Discharge Instructions Discharge Instructions    Discharge instructions    Complete by:  As directed    Patient will be discharged to home.  Patient will need to follow up with primary care provider within one week of discharge.  Follow up with Dr. Julien Nordmann, oncology. Patient should continue medications as prescribed.  Patient should follow a regular diet.     Current Discharge Medication List    START taking these medications   Details  ALPRAZolam (XANAX) 0.25 MG tablet Take 2 tablets (0.5 mg total) by mouth 2 (two) times daily as needed for anxiety or sleep. Qty: 30 tablet, Refills:  0    Amino Acids-Protein Hydrolys (FEEDING SUPPLEMENT, PRO-STAT SUGAR FREE 64,) LIQD Take 30 mLs by mouth 3 (three) times daily between meals. Qty: 900 mL, Refills: 0      CONTINUE these medications which have NOT CHANGED   Details  acetaminophen (TYLENOL) 325 MG tablet Take 2 tablets (650 mg total) by mouth every 6 (six) hours as needed for mild pain (or Fever >/= 101). Qty: 10 tablet, Refills: 0    dexamethasone (DECADRON) 4 MG tablet 4 mg by mouth twice a day the day before, day of and day after the chemotherapy every 3 weeks Qty: 40 tablet, Refills: 1    feeding supplement, ENSURE ENLIVE, (ENSURE ENLIVE) LIQD Take 237 mLs by mouth 2 (two) times daily between meals. Qty: 237 mL, Refills: 12    folic acid (FOLVITE) 1 MG tablet Take 1 tablet (1 mg total) by mouth daily. Qty: 90 tablet, Refills: 3    HYDROcodone-acetaminophen (NORCO/VICODIN) 5-325 MG tablet Take 1-2 tablets by mouth every 4 (four) hours as needed for moderate pain. Qty: 45 tablet, Refills: 0    levalbuterol (XOPENEX) 0.63 MG/3ML nebulizer solution Take 3 mLs (0.63 mg total) by nebulization 2 (two) times daily. Qty: 3 mL, Refills: 12    lidocaine-prilocaine (EMLA) cream Apply 1 application topically as needed. Qty: 30 g, Refills: 0    metoprolol tartrate (LOPRESSOR) 25 MG tablet Take 1 tablet (25 mg total) by mouth 2 (two) times daily. Qty: 60 tablet, Refills: 12    Multiple Vitamins-Minerals (MULTIVITAMIN WITH MINERALS) tablet Take 1 tablet by mouth daily.    polyethylene glycol (MIRALAX / GLYCOLAX) packet Take 17 g by mouth daily as needed for mild constipation. Qty: 14 each, Refills: 0    prochlorperazine (COMPAZINE) 10 MG tablet Take 1 tablet (10 mg total) by mouth every 6 (six) hours as needed for nausea or vomiting. Qty: 30 tablet, Refills: 0    senna-docusate (SENOKOT S) 8.6-50 MG tablet Take 1-2 tablets by mouth daily as needed for mild constipation.    traMADol (ULTRAM) 50 MG tablet Take 1 tablet (50  mg total) by mouth every 6 (six) hours as needed (mild pain). Qty: 30 tablet, Refills: 1       No Active Allergies Follow-up Information    Maren Reamer, MD. Schedule an appointment as soon as possible for a visit in 1 week(s).   Specialty:  Internal Medicine Why:  Hospital follow up Contact information: [place] [address] [phone]        Eilleen Kempf., MD. Schedule an appointment as soon as possible for a visit in 1 week(s).   Specialty:  Oncology Why:  Hospital follow up Contact information: [place] [address] [phone]            The results of significant diagnostics from this hospitalization (including imaging, microbiology, ancillary and laboratory) are listed below for reference.    Significant Diagnostic Studies: Dg Chest 2 View  Result Date: 09/08/2016 CLINICAL DATA:  Two weeks in exertional chest pain. History of large cell lung malignancy. Patient is exhibiting tachycardia. EXAM: CHEST  2 VIEW COMPARISON:  CT scan of the chest of  July 21, 2016 and chest x-ray of July 18, 2016 FINDINGS: There is a right-sided hydropneumothorax occupying at least 50% of the lung volume. This has not significantly changed since the previous study. There is mild displacement of the mediastinum toward the left. The interstitial markings throughout the left lung are increased as compared to the previous study. The heart is normal in size. The pulmonary vascularity is not engorged. Evening. [facility] Radiology contrast thickened Dr. spinal of about patient we and [place] with left IMPRESSION: Stable appearance of the large right-sided hydropneumothorax occupying at least 50% of the lung volume. Significantly increased interstitial markings throughout the left lung which may reflect pulmonary edema, lymphangitis spread of malignancy, or interstitial pneumonia in the appropriate setting. Findings were discussed by  telephone with Dr. Ashok Cordia at the conclusion of the study. Electronically Signed   By: David  Martinique M.D.   On: 09/08/2016 14:57    Microbiology: No results found for this or any previous visit (from the past 240 hour(s)).   Labs: Basic Metabolic Panel:  Recent Labs Lab 09/08/16 1435 09/09/16 0408  NA 138 138  K 4.2 3.7  CL 99* 97*  CO2 29 31  GLUCOSE 118* 99  BUN 14 15  CREATININE 0.82 0.83  CALCIUM 9.3 9.0   Liver Function Tests:  Recent Labs Lab 09/08/16 1435  AST 63*  ALT 57  ALKPHOS 105  BILITOT 0.4  PROT 6.5  ALBUMIN 2.6*   No results for input(s): LIPASE, AMYLASE in the last 168 hours. No results for input(s): AMMONIA in the last 168 hours. CBC:  Recent Labs Lab 09/08/16 1435 09/09/16 0408  WBC 11.0* 9.5  HGB 12.1* 11.1*  HCT 37.8* 35.2*  MCV 96.9 97.2  PLT 392 377   Cardiac Enzymes: No results for input(s): CKTOTAL, CKMB, CKMBINDEX, TROPONINI in the last 168 hours. BNP: BNP (last 3 results) No results for input(s): BNP in the last 8760 hours.  ProBNP (last 3 results) No results for input(s): PROBNP in the last 8760 hours.  CBG: No results for input(s): GLUCAP in the last 168 hours.     SignedCristal Ford  [facility] 09/09/2016, 3:27 PM

## 2016-09-09 NOTE — Discharge Instructions (Signed)
Lung Cancer Lung cancer occurs when abnormal cells in the lung grow out of control and form a mass (tumor). There are several types of lung cancer. The two most common types are:  Non-small cell. In this type of lung cancer, abnormal cells are larger and grow more slowly than those of small cell lung cancer.  Small cell. In this type of lung cancer, abnormal cells are smaller than those of non-small cell lung cancer. Small cell lung cancer gets worse faster than non-small cell lung cancer.  What are the causes? The leading cause of lung cancer is smoking tobacco. The second leading cause is radon exposure. What increases the risk?  Smoking tobacco.  Exposure to secondhand tobacco smoke.  Exposure to radon gas.  Exposure to asbestos.  Exposure to arsenic in drinking water.  Air pollution.  Family or personal history of lung cancer.  Lung radiation therapy.  Being older than 65 years. What are the signs or symptoms? In the early stages, symptoms may not be present. As the cancer progresses, symptoms may include:  A lasting cough, possibly with blood.  Fatigue.  Unexplained weight loss.  Shortness of breath.  Wheezing.  Chest pain.  Loss of appetite.  Symptoms of advanced lung cancer include:  Hoarseness.  Bone or joint pain.  Weakness.  Nail problems.  Face or arm swelling.  Paralysis of the face.  Drooping eyelids.  How is this diagnosed? Lung cancer can be identified with a physical exam and with tests such as:  A chest X-ray.  A CT scan.  Blood tests.  A biopsy.  After a diagnosis is made, you will have more tests to determine the stage of the cancer. The stages of non-small cell lung cancer are:  Stage 0, also called carcinoma in situ. At this stage, abnormal cells are found in the inner lining of your lung or lungs.  Stage I. At this stage, abnormal cells have grown into a tumor that is no larger than 5 cm across. The cancer has entered  the deeper lung tissue but has not yet entered the lymph nodes or other parts of the body.  Stage II. At this stage, the tumor is 7 cm across or smaller and has entered nearby lymph nodes. Or, the tumor is 5 cm across or smaller and has invaded surrounding tissue but is not found in nearby lymph nodes. There may be more than one tumor present.  Stage III. At this stage, the tumor may be any size. There may be more than one tumor in the lungs. The cancer cells have spread to the lymph nodes and possibly to other organs.  Stage IV. At this stage, there are tumors in both lungs and the cancer has spread to other areas of the body.  The stages of small cell lung cancer are:  Limited. At this stage, the cancer is found only on one side of the chest.  Extensive. At this stage, the cancer is in the lungs and in tissues on the other side of the chest. The cancer has spread to other organs or is found in the fluid between the layers of your lungs.  How is this treated? Depending on the type and stage of your lung cancer, you may be treated with:  Surgery. This is done to remove a tumor.  Radiation therapy. This treatment destroys cancer cells using X-rays or other types of radiation.  Chemotherapy. This treatment uses medicines to destroy cancer cells.  Targeted therapy. This treatment   aims to destroy only cancer cells instead of all cells as other therapies do.  You may also have a combination of treatments. Follow these instructions at home:  Do not use any tobacco products. This includes cigarettes, chewing tobacco, and electronic cigarettes. If you need help quitting, ask your health care provider.  Take medicines only as directed by your health care provider.  Eat a healthy diet. Work with a dietitian to make sure you are getting the nutrition you need.  Consider joining a support group or seeking counseling to help you cope with the stress of having lung cancer.  Let your cancer  specialist (oncologist) know if you are admitted to the hospital.  Keep all follow-up visits as directed by your health care provider. This is important. Contact a health care provider if:  You lose weight without trying.  You have a persistent cough and wheezing.  You feel short of breath.  You tire easily.  You experience bone or joint pain.  You have difficulty swallowing.  You feel hoarse or notice your voice changing.  Your pain medicine is not helping. Get help right away if:  You cough up blood.  You have new breathing problems.  You develop chest pain.  You develop swelling in: ? One or both ankles or legs. ? Your face, neck, or arms.  You are confused.  You experience paralysis in your face or a drooping eyelid. This information is not intended to replace advice given to you by your health care provider. Make sure you discuss any questions you have with your health care provider. Document Released: 08/25/2000 Document Revised: 10/25/2015 Document Reviewed: 09/22/2013 Elsevier Interactive Patient Education  2017 Elsevier Inc.  

## 2016-09-09 NOTE — Care Management Note (Signed)
Case Management Note Marvetta Gibbons RN, BSN Unit 2W-Case Manager [phone]  Patient Details  Name: Roger Barnes MRN: [id number] Date of Birth: 1961-04-14  Subjective/Objective:  Pt admitted with pleural effusion, hx of lung CA                 Action/Plan: PTA pt ;lived at home with parents- plan to return home- per bedside RN and MD pt needs help with transportation home- CSW consulted for transportation needs. No CM needs noted for discharge.   Expected Discharge Date:  09/09/16               Expected Discharge Plan:  Home/Self Care  In-House Referral:  Clinical Social Work  Discharge planning Services  CM Consult  Post Acute Care Choice:  Durable Medical Equipment Choice offered to:  Patient  DME Arranged:  3-N-1, Oxygen DME Agency:  [place]:  NA [place] Agency:  NA  Status of Service:  Completed, signed off  If discussed at [place] of Stay Meetings, dates discussed:    Additional Comments:  4/10-18- 1500- Anav Lammert RN, CM- update- informed that pt will need 02 and 3n1- spoke with pt at bedside- per pt he already has home 02 with [facility]- however his baseline is 2L - pt now on 4L - spoke with MD who will give orders for new liter flow- have spoken with [place] and [place] who will change out pt's equipment at home to a 10 L to allow for new liter flow requirements- order for 3n1 given to pt to take to local DME store to pick up as he does not want to wait on home delivery shipment- Pt will need to go home via non-emergent EMS as his mobility is limited at present and he gets very SOB with exertion. Have calle [facility] for transport- paperwork has been placed on shadow chart - bedside RN aware.   Dawayne Patricia, RN 09/09/2016, 3:46 PM

## 2016-09-09 NOTE — Progress Notes (Signed)
Initial Nutrition Assessment  DOCUMENTATION CODES:   Severe malnutrition in context of chronic illness  INTERVENTION:   -Continue MVI daily -Ensure Enlive po q HD, each supplement provides 350 kcal and 20 grams of protein -30 ml Prostat TID, each supplement provides 100 kcals and 15 grams protein daily  NUTRITION DIAGNOSIS:   Malnutrition (Severe) related to chronic illness (stage IV lung cancer) as evidenced by severe depletion of body fat, severe depletion of muscle mass, percent weight loss.  GOAL:   Patient will meet greater than or equal to 90% of their needs  MONITOR:   PO intake, Supplement acceptance, Labs, Weight trends, Skin, I & O's  REASON FOR ASSESSMENT:   Malnutrition Screening Tool    ASSESSMENT:   Roger Barnes is a 56 y.o. male with medical history significant of stage 4 lung cancer with recurrent hydothorax and pleural effusions.  He had a previous pleurex placed by Dr. Nils Pyle.  It was removed after he went his last cycle of chemo as it was not draining per patient  Pt admitted with dyspnea due to large right sided hydropneumothorax and left sided possible lymphangitis. CVTS consulted for possible drain.   Spoke with pt who reports poor appetite over the past few days related to SOB and diarrhea. He shares that he has been trying to focus on receiving adequate protein and has been consuming Ensure shakes, [place], and cube steak. Pt complains of early satiety, but consumes frequent small meals throughout the days.   Pt reports he also takes many vitamins, including a dissolvable MVI which he prefers over the traditional MVI due to ease in swallowing.   Pt endorses weight loss and shares that he has lost "a few pounds" over the past week. Wt hx reviewed, which reveals ongoing wt loss over at least the past 6 months. He has experienced a 8.7% wt loss over the past 3 months, which is significant for time frame.   Pt is wary to try Ensure at this time  related to diarrhea. He is looking forward to his grilled chicken sandwich that he ordered for lunch. RD will order snacks and Prostat.   Medications reviewed and include MVI.   Labs reviewed.   Diet Order:  Diet regular Room service appropriate? Yes; Fluid consistency: Thin  Skin:  Wound (see comment) (st II sacrum)  Last BM:  09/08/16  Height:   Ht Readings from Last 1 Encounters:  09/08/16 '5\' 10"'$  (1.778 m)    Weight:   Wt Readings from Last 1 Encounters:  09/08/16 135 lb (61.2 kg)    Ideal Body Weight:  75.5 kg  BMI:  Body mass index is 19.37 kg/m.  Estimated Nutritional Needs:   Kcal:  2100-2300  Protein:  105-120 grams  Fluid:  2.1-2.3 L  EDUCATION NEEDS:   Education needs addressed  Jaylen Claude A. Jimmye Norman, RD, LDN, CDE Pager: [phone] After hours Pager: [phone]

## 2016-09-09 NOTE — Care Management Note (Signed)
Case Management Note Marvetta Gibbons RN, BSN Unit 2W-Case Manager [phone]  Patient Details  Name: Roger Barnes MRN: [id number] Date of Birth: 05-04-1961  Subjective/Objective:  Pt admitted with pleural effusion, hx of lung CA                 Action/Plan: PTA pt ;lived at home with parents- plan to return home- per bedside RN and MD pt needs help with transportation home- CSW consulted for transportation needs. No CM needs noted for discharge.   Expected Discharge Date:  09/09/16               Expected Discharge Plan:  Home/Self Care  In-House Referral:  Clinical Social Work  Discharge planning Services  CM Consult  Post Acute Care Choice:  NA Choice offered to:  NA  DME Arranged:    DME Agency:     HH Arranged:    HH Agency:     Status of Service:  Completed, signed off  If discussed at [employer], dates discussed:    Additional Comments:  Dawayne Patricia, RN 09/09/2016, 2:39 PM

## 2016-09-10 ENCOUNTER — Telehealth: Payer: Self-pay | Admitting: Medical Oncology

## 2016-09-10 ENCOUNTER — Ambulatory Visit: Payer: Medicaid Other

## 2016-09-10 ENCOUNTER — Other Ambulatory Visit: Payer: Medicaid Other

## 2016-09-10 ENCOUNTER — Ambulatory Visit: Payer: Medicaid Other | Admitting: Internal Medicine

## 2016-09-10 NOTE — Telephone Encounter (Signed)
Will see him if I have any earlier availability.

## 2016-09-10 NOTE — Telephone Encounter (Signed)
Back home after hsopital. Oxygen increased and has " potty chair" now.

## 2016-09-16 ENCOUNTER — Other Ambulatory Visit: Payer: Self-pay | Admitting: Internal Medicine

## 2016-09-25 ENCOUNTER — Encounter (HOSPITAL_COMMUNITY): Payer: Self-pay | Admitting: Emergency Medicine

## 2016-09-25 ENCOUNTER — Inpatient Hospital Stay (HOSPITAL_COMMUNITY)
Admission: EM | Admit: 2016-09-25 | Discharge: 2016-10-31 | DRG: 193 | Disposition: E | Payer: Medicare HMO | Attending: Family Medicine | Admitting: Family Medicine

## 2016-09-25 ENCOUNTER — Other Ambulatory Visit: Payer: Self-pay | Admitting: Medical Oncology

## 2016-09-25 ENCOUNTER — Telehealth: Payer: Self-pay | Admitting: Medical Oncology

## 2016-09-25 ENCOUNTER — Emergency Department (HOSPITAL_COMMUNITY): Payer: Medicare HMO

## 2016-09-25 ENCOUNTER — Inpatient Hospital Stay (HOSPITAL_COMMUNITY): Payer: Medicare HMO

## 2016-09-25 DIAGNOSIS — W19XXXA Unspecified fall, initial encounter: Secondary | ICD-10-CM

## 2016-09-25 DIAGNOSIS — R000 Tachycardia, unspecified: Secondary | ICD-10-CM | POA: Diagnosis present

## 2016-09-25 DIAGNOSIS — C3491 Malignant neoplasm of unspecified part of right bronchus or lung: Secondary | ICD-10-CM | POA: Diagnosis present

## 2016-09-25 DIAGNOSIS — J9621 Acute and chronic respiratory failure with hypoxia: Secondary | ICD-10-CM | POA: Diagnosis present

## 2016-09-25 DIAGNOSIS — Z9981 Dependence on supplemental oxygen: Secondary | ICD-10-CM

## 2016-09-25 DIAGNOSIS — Y95 Nosocomial condition: Secondary | ICD-10-CM | POA: Diagnosis present

## 2016-09-25 DIAGNOSIS — Y92238 Other place in hospital as the place of occurrence of the external cause: Secondary | ICD-10-CM | POA: Diagnosis not present

## 2016-09-25 DIAGNOSIS — E860 Dehydration: Secondary | ICD-10-CM | POA: Diagnosis present

## 2016-09-25 DIAGNOSIS — Z825 Family history of asthma and other chronic lower respiratory diseases: Secondary | ICD-10-CM | POA: Diagnosis not present

## 2016-09-25 DIAGNOSIS — J948 Other specified pleural conditions: Secondary | ICD-10-CM | POA: Diagnosis present

## 2016-09-25 DIAGNOSIS — Z87891 Personal history of nicotine dependence: Secondary | ICD-10-CM

## 2016-09-25 DIAGNOSIS — D6481 Anemia due to antineoplastic chemotherapy: Secondary | ICD-10-CM | POA: Diagnosis present

## 2016-09-25 DIAGNOSIS — Z7952 Long term (current) use of systemic steroids: Secondary | ICD-10-CM | POA: Diagnosis not present

## 2016-09-25 DIAGNOSIS — Z66 Do not resuscitate: Secondary | ICD-10-CM | POA: Diagnosis present

## 2016-09-25 DIAGNOSIS — R410 Disorientation, unspecified: Secondary | ICD-10-CM

## 2016-09-25 DIAGNOSIS — J9601 Acute respiratory failure with hypoxia: Secondary | ICD-10-CM | POA: Diagnosis not present

## 2016-09-25 DIAGNOSIS — D72829 Elevated white blood cell count, unspecified: Secondary | ICD-10-CM | POA: Diagnosis present

## 2016-09-25 DIAGNOSIS — J189 Pneumonia, unspecified organism: Principal | ICD-10-CM | POA: Diagnosis present

## 2016-09-25 DIAGNOSIS — E871 Hypo-osmolality and hyponatremia: Secondary | ICD-10-CM | POA: Diagnosis present

## 2016-09-25 DIAGNOSIS — W1830XA Fall on same level, unspecified, initial encounter: Secondary | ICD-10-CM | POA: Diagnosis not present

## 2016-09-25 DIAGNOSIS — Z7189 Other specified counseling: Secondary | ICD-10-CM | POA: Diagnosis not present

## 2016-09-25 DIAGNOSIS — R0602 Shortness of breath: Secondary | ICD-10-CM | POA: Diagnosis present

## 2016-09-25 DIAGNOSIS — Z681 Body mass index (BMI) 19 or less, adult: Secondary | ICD-10-CM | POA: Diagnosis not present

## 2016-09-25 DIAGNOSIS — F419 Anxiety disorder, unspecified: Secondary | ICD-10-CM | POA: Diagnosis present

## 2016-09-25 DIAGNOSIS — Z515 Encounter for palliative care: Secondary | ICD-10-CM | POA: Diagnosis present

## 2016-09-25 DIAGNOSIS — T451X5A Adverse effect of antineoplastic and immunosuppressive drugs, initial encounter: Secondary | ICD-10-CM | POA: Diagnosis not present

## 2016-09-25 DIAGNOSIS — E43 Unspecified severe protein-calorie malnutrition: Secondary | ICD-10-CM | POA: Diagnosis present

## 2016-09-25 LAB — CBC WITH DIFFERENTIAL/PLATELET
BASOS ABS: 0 10*3/uL (ref 0.0–0.1)
BASOS PCT: 0 %
EOS ABS: 0.1 10*3/uL (ref 0.0–0.7)
Eosinophils Relative: 1 %
HCT: 43.2 % (ref 39.0–52.0)
Hemoglobin: 14.1 g/dL (ref 13.0–17.0)
LYMPHS PCT: 9 %
Lymphs Abs: 1.3 10*3/uL (ref 0.7–4.0)
MCH: 30.6 pg (ref 26.0–34.0)
MCHC: 32.6 g/dL (ref 30.0–36.0)
MCV: 93.7 fL (ref 78.0–100.0)
MONOS PCT: 7 %
Monocytes Absolute: 1 10*3/uL (ref 0.1–1.0)
NEUTROS PCT: 83 %
Neutro Abs: 11.9 10*3/uL — ABNORMAL HIGH (ref 1.7–7.7)
PLATELETS: 342 10*3/uL (ref 150–400)
RBC: 4.61 MIL/uL (ref 4.22–5.81)
RDW: 15.5 % (ref 11.5–15.5)
WBC: 14.3 10*3/uL — ABNORMAL HIGH (ref 4.0–10.5)

## 2016-09-25 LAB — I-STAT CHEM 8, ED
BUN: 19 mg/dL (ref 6–20)
CALCIUM ION: 1.14 mmol/L — AB (ref 1.15–1.40)
Chloride: 90 mmol/L — ABNORMAL LOW (ref 101–111)
Creatinine, Ser: 0.7 mg/dL (ref 0.61–1.24)
GLUCOSE: 104 mg/dL — AB (ref 65–99)
HCT: 47 % (ref 39.0–52.0)
HEMOGLOBIN: 16 g/dL (ref 13.0–17.0)
Potassium: 4.6 mmol/L (ref 3.5–5.1)
Sodium: 132 mmol/L — ABNORMAL LOW (ref 135–145)
TCO2: 37 mmol/L (ref 0–100)

## 2016-09-25 LAB — CREATININE, SERUM
CREATININE: 0.79 mg/dL (ref 0.61–1.24)
GFR calc Af Amer: >60 mL/min (ref >60)
GFR calc non Af Amer: >60 mL/min (ref >60)

## 2016-09-25 LAB — I-STAT ARTERIAL BLOOD GAS, ED
ACID-BASE EXCESS: 6 mmol/L — AB (ref 0.0–2.0)
Bicarbonate: 34 mmol/L — ABNORMAL HIGH (ref 20.0–28.0)
O2 Saturation: 100 %
PCO2 ART: 60.4 mmHg — AB (ref 32.0–48.0)
PH ART: 7.354 (ref 7.350–7.450)
Patient temperature: 97
TCO2: 36 mmol/L (ref 0–100)
pO2, Arterial: 321 mmHg — ABNORMAL HIGH (ref 83.0–108.0)

## 2016-09-25 LAB — I-STAT TROPONIN, ED: TROPONIN I, POC: 0 ng/mL (ref 0.00–0.08)

## 2016-09-25 LAB — TROPONIN I: TROPONIN I: 0.03 ng/mL — AB (ref <0.03)

## 2016-09-25 MED ORDER — SODIUM CHLORIDE 0.9 % IV SOLN: 250.0000 mL | INTRAVENOUS | Status: DC | PRN

## 2016-09-25 MED ORDER — SODIUM CHLORIDE 0.9% FLUSH
3.0000 mL | Freq: Two times a day (BID) | INTRAVENOUS | Status: DC
  Administered 2016-09-26 – 2016-09-29 (×7): 3 mL via INTRAVENOUS

## 2016-09-25 MED ORDER — SODIUM CHLORIDE 0.9 % IV BOLUS (SEPSIS)
500.0000 mL | Freq: Once | INTRAVENOUS | Status: AC
  Administered 2016-09-25: 500 mL via INTRAVENOUS

## 2016-09-25 MED ORDER — ALPRAZOLAM 0.5 MG PO TABS
0.5000 mg | ORAL_TABLET | Freq: Three times a day (TID) | ORAL | Status: DC | PRN
  Administered 2016-09-27: 0.5 mg via ORAL
  Filled 2016-09-25: qty 1

## 2016-09-25 MED ORDER — ENOXAPARIN SODIUM 40 MG/0.4ML ~~LOC~~ SOLN: 40.0000 mg | SUBCUTANEOUS | Status: DC

## 2016-09-25 MED ORDER — VANCOMYCIN HCL IN DEXTROSE 750-5 MG/150ML-% IV SOLN
750.0000 mg | Freq: Three times a day (TID) | INTRAVENOUS | Status: DC
  Administered 2016-09-26 – 2016-09-27 (×4): 750 mg via INTRAVENOUS
  Filled 2016-09-25 (×6): qty 150

## 2016-09-25 MED ORDER — MULTI-VITAMIN/MINERALS PO TABS: 1.0000 | ORAL_TABLET | Freq: Every day | ORAL | Status: DC

## 2016-09-25 MED ORDER — SODIUM CHLORIDE 0.9% FLUSH: 3.0000 mL | INTRAVENOUS | Status: DC | PRN

## 2016-09-25 MED ORDER — FOLIC ACID 1 MG PO TABS: 1.0000 mg | ORAL_TABLET | Freq: Every day | ORAL | Status: DC

## 2016-09-25 MED ORDER — ACETAMINOPHEN 650 MG RE SUPP: 650.0000 mg | Freq: Four times a day (QID) | RECTAL | Status: DC | PRN

## 2016-09-25 MED ORDER — ONDANSETRON HCL 4 MG/2ML IJ SOLN
4.0000 mg | Freq: Four times a day (QID) | INTRAMUSCULAR | Status: DC | PRN
  Administered 2016-09-28: 4 mg via INTRAVENOUS
  Filled 2016-09-25: qty 2

## 2016-09-25 MED ORDER — IPRATROPIUM-ALBUTEROL 0.5-2.5 (3) MG/3ML IN SOLN
3.0000 mL | Freq: Four times a day (QID) | RESPIRATORY_TRACT | Status: DC
  Administered 2016-09-25 – 2016-09-30 (×18): 3 mL via RESPIRATORY_TRACT
  Filled 2016-09-25 (×19): qty 3

## 2016-09-25 MED ORDER — ZOLPIDEM TARTRATE 5 MG PO TABS: 5.0000 mg | ORAL_TABLET | Freq: Every evening | ORAL | Status: DC | PRN

## 2016-09-25 MED ORDER — SODIUM CHLORIDE 0.9 % IV SOLN
Freq: Once | INTRAVENOUS | Status: AC
  Administered 2016-09-25: 500 mL/h via INTRAVENOUS

## 2016-09-25 MED ORDER — BISACODYL 5 MG PO TBEC: 5.0000 mg | DELAYED_RELEASE_TABLET | Freq: Every day | ORAL | Status: DC | PRN

## 2016-09-25 MED ORDER — ENOXAPARIN SODIUM 40 MG/0.4ML ~~LOC~~ SOLN
40.0000 mg | SUBCUTANEOUS | Status: DC
  Administered 2016-09-25 – 2016-09-30 (×5): 40 mg via SUBCUTANEOUS
  Filled 2016-09-25 (×5): qty 0.4

## 2016-09-25 MED ORDER — DEXTROSE 5 % IV SOLN
1.0000 g | Freq: Three times a day (TID) | INTRAVENOUS | Status: DC
  Administered 2016-09-26 – 2016-09-30 (×13): 1 g via INTRAVENOUS
  Filled 2016-09-25 (×16): qty 1

## 2016-09-25 MED ORDER — HYDROCODONE-ACETAMINOPHEN 5-325 MG PO TABS: 1.0000 | ORAL_TABLET | ORAL | Status: DC | PRN

## 2016-09-25 MED ORDER — ACETAMINOPHEN 325 MG PO TABS: 650.0000 mg | ORAL_TABLET | Freq: Four times a day (QID) | ORAL | Status: DC | PRN

## 2016-09-25 MED ORDER — LORAZEPAM 2 MG/ML IJ SOLN
2.0000 mg | INTRAMUSCULAR | Status: DC | PRN
  Administered 2016-09-25: 2 mg via INTRAVENOUS
  Filled 2016-09-25: qty 1

## 2016-09-25 MED ORDER — PRO-STAT SUGAR FREE PO LIQD
30.0000 mL | Freq: Two times a day (BID) | ORAL | Status: DC
Start: 2016-09-26 — End: 2016-09-26

## 2016-09-25 MED ORDER — POLYETHYLENE GLYCOL 3350 17 G PO PACK
17.0000 g | PACK | Freq: Every day | ORAL | Status: DC | PRN
  Administered 2016-09-28: 17 g via ORAL
  Filled 2016-09-25: qty 1

## 2016-09-25 MED ORDER — VANCOMYCIN HCL IN DEXTROSE 1-5 GM/200ML-% IV SOLN
1000.0000 mg | Freq: Once | INTRAVENOUS | Status: AC
  Administered 2016-09-25: 1000 mg via INTRAVENOUS
  Filled 2016-09-25: qty 200

## 2016-09-25 MED ORDER — ONDANSETRON HCL 4 MG PO TABS: 4.0000 mg | ORAL_TABLET | Freq: Four times a day (QID) | ORAL | Status: DC | PRN

## 2016-09-25 MED ORDER — PROCHLORPERAZINE MALEATE 10 MG PO TABS
10.0000 mg | ORAL_TABLET | Freq: Four times a day (QID) | ORAL | Status: DC | PRN
  Filled 2016-09-25: qty 1

## 2016-09-25 MED ORDER — DEXTROSE 5 % IV SOLN
2.0000 g | Freq: Once | INTRAVENOUS | Status: AC
  Administered 2016-09-25: 2 g via INTRAVENOUS
  Filled 2016-09-25: qty 2

## 2016-09-25 MED ORDER — IOPAMIDOL (ISOVUE-370) INJECTION 76%
INTRAVENOUS | Status: AC
  Administered 2016-09-25: 100 mL
  Filled 2016-09-25: qty 100

## 2016-09-25 MED ORDER — ENSURE ENLIVE PO LIQD: 237.0000 mL | Freq: Two times a day (BID) | ORAL | Status: DC

## 2016-09-25 MED ORDER — METOPROLOL TARTRATE 25 MG PO TABS
25.0000 mg | ORAL_TABLET | Freq: Two times a day (BID) | ORAL | Status: DC
  Administered 2016-09-25 – 2016-09-29 (×7): 25 mg via ORAL
  Filled 2016-09-25 (×8): qty 1

## 2016-09-25 MED ORDER — ADULT MULTIVITAMIN W/MINERALS CH: 1.0000 | ORAL_TABLET | Freq: Every day | ORAL | Status: DC

## 2016-09-25 MED ORDER — PRO-STAT PO LIQD: 30.0000 mL | Freq: Three times a day (TID) | ORAL | Status: DC

## 2016-09-25 NOTE — Progress Notes (Signed)
Patient is taken off the Bipap and placed on 5L [place] to assess if he can tolerate being off the Bipap.

## 2016-09-25 NOTE — Progress Notes (Signed)
Patient is tolerating being off BIPAP and on 5L [place]. BIPAP is not needed at this time. RT will monitor as needed.

## 2016-09-25 NOTE — ED Provider Notes (Signed)
[name]   CSN: [id number] Arrival date & time: 09/19/2016  1311     History   Chief Complaint Chief Complaint  Patient presents with  . Shortness of Breath    HPI Roger Barnes is a 56 y.o. male. Chief complaint is chest pain and shortness of breath and a history of lung cancer.  HPI 89 male. History of lung cancer. Previous recurrent right-sided pleural effusions. Most recent ER visit was 4/9. Was admitted for dyspnea. Studies showed 50% right pleural effusion. CT surgery felt his pleural effusion was unchanged. He was discharged home to continue his home therapy and chemotherapy. Did not undergo thoracentesis. Has had previous thoracentesis, and pleurodesis and had previous right-sided pleural cath which has been since removed.  He presents here complaining of bilateral anterior chest pain and shortness of breath. He is to get to get anxious upon his arrival. He states he was given Xanax at home but states he felt like "it was going to kill me".  He states he is on 2 L of home oxygen. He states he keeps "telling them I need one of these masks". He is on a mask O2 is 100%. On room air he is 98%. He is placed back on his home O2 2 L which is typical for him.  Past Medical History:  Diagnosis Date  . Adenocarcinoma of right lung, stage 4 ([place])   . Anxiety   . Dyspnea   . Encounter for antineoplastic chemotherapy 02/26/2016  . Goals of care, counseling/discussion 07/23/2016  . Pleural effusion, right 01/25/2016  . Tachycardia     Patient Active Problem List   Diagnosis Date Noted  . Pressure injury of skin 09/09/2016  . Protein-calorie malnutrition, severe 09/09/2016  . Tachycardia 09/08/2016  . Goals of care, counseling/discussion 07/23/2016  . Antineoplastic chemotherapy induced anemia 05/07/2016  . Stuffy and runny nose 05/07/2016  . Encounter for antineoplastic chemotherapy 02/26/2016  . Adenocarcinoma of right lung, stage 4 ([place])   . Acute  respiratory failure with hypoxia ([place])   . Atelectasis   . Pleural effusion 01/25/2016  . Elevated d-dimer 01/25/2016  . Abnormal EKG 01/25/2016  . Pleural effusion, right   . Dyspnea     Past Surgical History:  Procedure Laterality Date  . CHEST TUBE INSERTION Right 02/01/2016   Procedure: INSERTION PLEURAL DRAINAGE CATHETER;  Surgeon: Ivin Poot, MD;  Location: [place];  Service: Thoracic;  Laterality: Right;  . PLEURADESIS Right 02/01/2016   Procedure: DOXYCYCLINE PLEURADESIS;  Surgeon: Ivin Poot, MD;  Location: [place];  Service: Thoracic;  Laterality: Right;  . PORTACATH PLACEMENT Right 03/06/2016   Procedure: INSERTION PORT-A-CATH;  Surgeon: Ivin Poot, MD;  Location: [place];  Service: Thoracic;  Laterality: Right;  . REMOVAL OF PLEURAL DRAINAGE CATHETER Right 07/08/2016   Procedure: REMOVAL OF PLEURAL DRAINAGE CATHETER;  Surgeon: Ivin Poot, MD;  Location: [place];  Service: Thoracic;  Laterality: Right;  . THORACENTESIS Right 01/25/2016  . VIDEO ASSISTED THORACOSCOPY Right 02/01/2016   Procedure: VIDEO ASSISTED THORACOSCOPY;  Surgeon: Ivin Poot, MD;  Location: [place];  Service: Thoracic;  Laterality: Right;  Marland Kitchen VIDEO BRONCHOSCOPY N/A 02/01/2016   Procedure: VIDEO BRONCHOSCOPY;  Surgeon: Ivin Poot, MD;  Location: [facility] OR;  Service: Thoracic;  Laterality: N/A;  . WISDOM TOOTH EXTRACTION         Home Medications    Prior to Admission medications   Medication Sig Start Date End Date Taking? Authorizing  Provider  acetaminophen (TYLENOL) 325 MG tablet Take 2 tablets (650 mg total) by mouth every 6 (six) hours as needed for mild pain (or Fever >/= 101). 02/05/16   Belkys A Regalado, MD  ALPRAZolam (XANAX) 0.25 MG tablet Take 2 tablets (0.5 mg total) by mouth 2 (two) times daily as needed for anxiety or sleep. 09/09/16   Maryann Mikhail, DO  Amino Acids-Protein Hydrolys (PRO-STAT) LIQD TAKE 30 MLS BY MOUTH 3 (THREE) TIMES DAILY BETWEEN MEALS. 09/16/16   Maren Reamer, MD   dexamethasone (DECADRON) 4 MG tablet 4 mg by mouth twice a day the day before, day of and day after the chemotherapy every 3 weeks 02/26/16   Curt Bears, MD  feeding supplement, ENSURE ENLIVE, (ENSURE ENLIVE) LIQD Take 237 mLs by mouth 2 (two) times daily between meals. Patient taking differently: Take 237 mLs by mouth 2 (two) times daily as needed (takes occasional).  02/05/16   Belkys A Regalado, MD  folic acid (FOLVITE) 1 MG tablet Take 1 tablet (1 mg total) by mouth daily. 08/07/16   Curt Bears, MD  HYDROcodone-acetaminophen (NORCO/VICODIN) 5-325 MG tablet Take 1-2 tablets by mouth every 4 (four) hours as needed for moderate pain. 06/09/16   Maren Reamer, MD  levalbuterol (XOPENEX) 0.63 MG/3ML nebulizer solution Take 3 mLs (0.63 mg total) by nebulization 2 (two) times daily. 03/10/16   Brayton Caves, PA-C  lidocaine-prilocaine (EMLA) cream Apply 1 application topically as needed. 02/26/16   Curt Bears, MD  metoprolol tartrate (LOPRESSOR) 25 MG tablet Take 1 tablet (25 mg total) by mouth 2 (two) times daily. 03/10/16   Brayton Caves, PA-C  Multiple Vitamins-Minerals (MULTIVITAMIN WITH MINERALS) tablet Take 1 tablet by mouth daily.    Historical Provider, MD  polyethylene glycol (MIRALAX / GLYCOLAX) packet Take 17 g by mouth daily as needed for mild constipation. 02/05/16   Belkys A Regalado, MD  prochlorperazine (COMPAZINE) 10 MG tablet Take 1 tablet (10 mg total) by mouth every 6 (six) hours as needed for nausea or vomiting. 02/26/16   Curt Bears, MD  senna-docusate (SENOKOT S) 8.6-50 MG tablet Take 1-2 tablets by mouth daily as needed for mild constipation.    Historical Provider, MD  traMADol (ULTRAM) 50 MG tablet Take 1 tablet (50 mg total) by mouth every 6 (six) hours as needed (mild pain). 06/09/16   Maren Reamer, MD    Family History Family History  Problem Relation Age of Onset  . Cancer Father     oral  . COPD Maternal Grandmother     Social History Social History   Substance Use Topics  . Smoking status: Former Research scientist (life sciences)  . Smokeless tobacco: Never Used     Comment: 01/25/2016 "haven't smoked since <2007"  . Alcohol use No     Allergies   Patient has no known allergies.   Review of Systems Review of Systems  Constitutional: Negative for appetite change, chills, diaphoresis, fatigue and fever.  HENT: Negative for mouth sores, sore throat and trouble swallowing.   Eyes: Negative for visual disturbance.  Respiratory: Positive for cough and shortness of breath. Negative for chest tightness and wheezing.   Cardiovascular: Positive for chest pain.  Gastrointestinal: Negative for abdominal distention, abdominal pain, diarrhea, nausea and vomiting.  Endocrine: Negative for polydipsia, polyphagia and polyuria.  Genitourinary: Negative for dysuria, frequency and hematuria.  Musculoskeletal: Negative for gait problem.  Skin: Negative for color change, pallor and rash.  Neurological: Negative for dizziness, syncope, light-headedness and headaches.  Hematological: Does not bruise/bleed easily.  Psychiatric/Behavioral: Negative for behavioral problems and confusion. The patient is nervous/anxious.      Physical Exam Updated Vital Signs BP (!) 121/96   Pulse (!) 127   Temp 98.2 F (36.8 C) (Oral)   Resp (!) 30   SpO2 91%   Physical Exam  Constitutional: He is oriented to person, place, and time. He appears well-developed and well-nourished. No distress.  HENT:  Head: Normocephalic.  Eyes: Conjunctivae are normal. Pupils are equal, round, and reactive to light. No scleral icterus.  Neck: Normal range of motion. Neck supple. No thyromegaly present.  Cardiovascular: Normal rate and regular rhythm.  Exam reveals no gallop and no friction rub.   No murmur heard. Pulmonary/Chest: No respiratory distress. He has no wheezes. He has no rales.      Tachypneic and hyperventilating. Markedly anxious upon presentation.  Abdominal: Soft. Bowel sounds are  normal. He exhibits no distension. There is no tenderness. There is no rebound.  Musculoskeletal: Normal range of motion.  Neurological: He is alert and oriented to person, place, and time.  Skin: Skin is warm and dry. No rash noted.  Psychiatric:  Anxious and hyperventilating     ED Treatments / Results  Labs (all labs ordered are listed, but only abnormal results are displayed) Labs Reviewed  CBC WITH DIFFERENTIAL/PLATELET - Abnormal; Notable for the following:       Result Value   WBC 14.3 (*)    Neutro Abs 11.9 (*)    All other components within normal limits  I-STAT CHEM 8, ED - Abnormal; Notable for the following:    Sodium 132 (*)    Chloride 90 (*)    Glucose, Bld 104 (*)    Calcium, Ion 1.14 (*)    All other components within normal limits  CULTURE, BLOOD (ROUTINE X 2)  CULTURE, BLOOD (ROUTINE X 2)  I-STAT TROPOININ, ED    EKG  EKG Interpretation None       Radiology Ct Angio Chest Pe W Or Wo Contrast  Result Date: 09/24/2016 CLINICAL DATA:  Shortness of breath, history of lung carcinoma and known hydropneumothorax EXAM: CT ANGIOGRAPHY CHEST WITH CONTRAST TECHNIQUE: Multidetector CT imaging of the chest was performed using the standard protocol during bolus administration of intravenous contrast. Multiplanar CT image reconstructions and MIPs were obtained to evaluate the vascular anatomy. CONTRAST:  100 mL Isovue 370. COMPARISON:  09/08/2016, 07/21/2016 FINDINGS: Cardiovascular: The thoracic aorta shows mild ascending dilatation at 3.9 cm. The pulmonary artery shows a normal branching pattern on the left without filling defect to suggest pulmonary embolism. On the right, no significant cardiac enlargement is noted. Right chest wall port is seen. The visualized branches show no filling defects. Mediastinum/Nodes: Thoracic inlet is within normal limits. New 13 mm short axis lymph node is noted in the AP window. Esophagus is within normal limits. Lungs/Pleura: Large right  hydropneumothorax is again identified and stable in appearance. Persisting consolidation in the right lower lobe is stable from the previous exam. The lungs however now demonstrate diffuse bilateral infiltrative changes and interstitial thickening. These changes may represent interstitial and lymphangitis spread of neoplasm although there abrupt onset suggests a more infectious process. This would correspond with the patient's elevated white blood cell count. Small left pleural effusion is noted. The previously seen pleural track on the right is no longer identified. Upper Abdomen: Visualized upper abdomen is incompletely evaluated. No acute abnormality is seen. \ Musculoskeletal: No definitive bony metastatic disease is seen.  Review of the MIP images confirms the above findings. IMPRESSION: Stable right hydropneumothorax and chronic changes in the right lung similar to that seen on the recent CT examination. No evidence of pulmonary emboli. Acute infiltrative changes noted throughout both lungs worse on the left than the right when compare with the prior exam from February 2018. This suggests some acute on chronic pneumonia with associated small left pleural effusion. Followup following appropriate therapy is recommended. New AP window lymph node likely reactive in nature. Mildly prominent ascending aorta but relatively stable from the previous exam. Electronically Signed   By: Inez Catalina M.D.   On: 09/10/2016 15:57    Procedures Procedures (including critical care time)  Medications Ordered in ED Medications  LORazepam (ATIVAN) injection 2 mg (2 mg Intravenous Given 09/10/2016 1420)  ceFEPIme (MAXIPIME) 2 g in dextrose 5 % 50 mL IVPB (not administered)  iopamidol (ISOVUE-370) 76 % injection (100 mLs  Contrast Given 09/29/2016 1522)     Initial Impression / Assessment and Plan / ED Course  I have reviewed the triage vital signs and the nursing notes.  Pertinent labs & imaging results that were  available during my care of the patient were reviewed by me and considered in my medical decision making (see chart for details).    I discussed with him that I felt this is very likely anxiety mediated. We'll plan x-ray CT rule out PE. His previous fusion was right-sided. His exam is normal in his left lung. Await studies. Given IV Ativan. Has improvement. EKG shows sinus rhythm without acute or ischemic changes. Troponin normal.  Final Clinical Impressions(s) / ED Diagnoses   Final diagnoses:  Healthcare-associated pneumonia    CT of the chest shows no pulmonary embolus. It does show persistent and unchanged hydropneumothorax on the right. He has no infiltrative process in the left lung differential diagnosis would include worsening infiltrative lung disease or lymphangitic spread versus acute pneumonia. Or combination of the 2. Patient remains tachypneic tachycardic. He is saturating well on 3 L. We will discuss with hospitalist regarding admission  New Prescriptions New Prescriptions   No medications on file     Tanna Furry, MD 09/24/2016 [phone]

## 2016-09-25 NOTE — ED Notes (Signed)
Dr. Storer at bedside.

## 2016-09-25 NOTE — ED Notes (Signed)
Admitting MD at bedside and pt speaks in complete sentences but continues with [facility].

## 2016-09-25 NOTE — Progress Notes (Signed)
Pharmacy Antibiotic Note  Roger Barnes is a 56 y.o. male with history of lung cancer and pleural effusion who was recently seen in the ED with SOB.  Patient is admitted on 09/26/2016 with SOB.  Pharmacy has been consulted for vancomycin dosing for PNA seen on CT.  First dose of cefepime is also ordered.  Patient's renal function is stable.   Plan: - Vanc 1gm IV x 1, then '750mg'$  IV Q8H for goal trough 15-20 mcg/mL - Monitor renal fxn, clinical progress, vanc trough at [facility] - F/U with continuation of GN coverage   Weight: 134 lb 14.7 oz (61.2 kg)  Temp (24hrs), Avg:98.2 F (36.8 C), Min:98.2 F (36.8 C), Max:98.2 F (36.8 C)   Recent Labs Lab 09/12/2016 1335 09/27/2016 1357  WBC 14.3*  --   CREATININE  --  0.70    Estimated Creatinine Clearance: 88.2 mL/min (by C-G formula based on SCr of 0.7 mg/dL).    No Known Allergies   Vanc 4/26 >> Cefepime   Janat Tabbert D. Mina Marble, PharmD, BCPS Pager:  [phone] 09/10/2016, 4:17 PM

## 2016-09-25 NOTE — ED Notes (Signed)
Attempted report 

## 2016-09-25 NOTE — ED Notes (Signed)
Pt found on floor with urinal by side and stool on his person, pt noted to have bilateral knee abrasions, no other obvious deformities.   IV to L arm out on floor, pt appeared pale and cyanotic though was responsive.  Pt. Assisted back in to bed, initial SPO2 found to be in the 70's pt placed on NRB by primary RN pt placed back on monitor in a ST rhythm BP WNL.  ER MD to bedside for immediate evaluation, Admit MD paged.  Pt expressed his desire to have all efforts made for life support to ER MD pt remains on monitor. Second IV re-established.  Pt remains pale and cyanotic in color with tachypnea noted RT to bedside for eval and ABG draw Admit MD enters room shortly after for evaluation pt is responding to verbal stimuli, otherwise appears to stare off in distance.  IVF infusing including antibiotics, new ekg reviewed by admit MD.

## 2016-09-25 NOTE — ED Notes (Signed)
Pt noted as having increased work of breathing and noted to Dr. Wynetta Emery. Dr. Wynetta Emery at bedside. Pt less responsive,.

## 2016-09-25 NOTE — ED Notes (Addendum)
Pt found on floor on knees at foot of bed. Pt has urinal and has stool on floor.  Abrasions noted at bilateral knees.Pt poorly responsive sat noted as 72%. o2 and monitor off. Returned to bed and placed on NRB. Dr. Wynetta Emery contacted.

## 2016-09-25 NOTE — Progress Notes (Signed)
09/26/2016 6:15 PM  Update:  Pt reportedly got out of bed in ED without his oxygen on and he fell down on floor.  He was found down, he had abrased his knees and he was pale and hypoxic with pulse ox in the 70s.  Pt was soiled of urine and stool.  He was placed back in bed and started on oxygen face mask.  His EKG had no acute findings.  I came and assessed him and found him confused but following commands.  His vitals improved.  I saw his EKG.  I ordered an ABG and new labs and an additional bolus of IVFs.  ABG pending.  Will have him go to the stepdown unit overnight and order Bipap as needed.  Neuro exam unremarkable.    Murvin Natal MD

## 2016-09-25 NOTE — Telephone Encounter (Signed)
Requests face mask because nasal cannula falls out of nose at night. I told pt he cannot get mask because oxygen amount is less 3 liters. It has to be 5 liters for mask.  He is obviously short of breath and having a hard time finishing conversation . He is not taking xanax because it makes him feel like he cannot catch his breath. I recommended pt be evaluated and he was going to call Dr Nils Pyle. I strongly recommended pt go to nearest ED to be evaluated.

## 2016-09-25 NOTE — ED Notes (Signed)
Pt has rapid breathing and urged to slow rate.

## 2016-09-25 NOTE — ED Notes (Signed)
Pt more alert now after being on bipap for one hour. Follows commands, appropriate answers to questions. Parents at bedside

## 2016-09-25 NOTE — ED Triage Notes (Signed)
Pt arrives by ems for sob and hyperventilating. Pt has history of lung cancer currently receiving treatment for same at [facility]. Pt lung sounds are clear.

## 2016-09-25 NOTE — H&P (Addendum)
History and Physical  Roger Barnes DGU:[id number] DOB: 1960/06/06 DOA: 09/05/2016  Referring physician: Jeneen Rinks PCP: Maren Reamer, MD  Outpatient Specialists:  1. Curt Bears - Oncology 2. Nils Pyle - CTS  Chief Complaint: Shortness of breath  HPI: Roger Barnes is a 56 y.o. 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  He had a previous pleurex placed by Dr. Nils Pyle.  It was removed after completing his last cycle of chemo as it was not draining per patient.  Patient follows with Dr. Julien Nordmann.  He has a history of previous recurrent right sided pleural effusions.  He was recently discharged 4/10 for an observation at [facility] for dyspnea.  CT surgery felt that his hydrothorax was stable and no intervention was needed.  He presents today with bilateral anterior chest wall pain and shortness of breath. He has an anxiety disorder and took xanax with no relief.  He reportedly was tachypneic and appeared markedly anxious but his oxygen saturation was good.  He was 98% on room air and 100% on oxygen mask.  He was evaluated in ED and noted to have a chest CT that was suspicious for pneumonia infiltrate acute on chronic.  No PE was noted.  He was started on empiric therapy for HCAP and admission was requested.    Review of Systems:  Constitutional: Negative for appetite change, chills, diaphoresis, fatigue and fever.  HENT: Negative for mouth sores, sore throat and trouble swallowing.   Eyes: Negative for visual disturbance.  Respiratory: Positive for cough and shortness of breath and occasional chest tightness and wheezing.   Cardiovascular: Positive for anterior chest wall pain.  Gastrointestinal: Negative for abdominal distention, abdominal pain, diarrhea, nausea and vomiting.  Endocrine: Negative for polydipsia, polyphagia and polyuria.  Genitourinary: Negative for dysuria, frequency and hematuria.    Musculoskeletal: Negative for gait problem.  Skin: Negative for color change, pallor and rash.  Neurological: Negative for dizziness, syncope, light-headedness and headaches.  Hematological: Does not bruise/bleed easily.  Psychiatric/Behavioral: Negative for behavioral problems and confusion. The patient is nervous/anxious.   All systems reviewed and apart from history of presenting illness, are negative.  Past Medical History:  Diagnosis Date  . Adenocarcinoma of right lung, stage 4 ([place])   . Anxiety   . Dyspnea   . Encounter for antineoplastic chemotherapy 02/26/2016  . Goals of care, counseling/discussion 07/23/2016  . Pleural effusion, right 01/25/2016  . Tachycardia    Past Surgical History:  Procedure Laterality Date  . CHEST TUBE INSERTION Right 02/01/2016   Procedure: INSERTION PLEURAL DRAINAGE CATHETER;  Surgeon: Ivin Poot, MD;  Location: [place];  Service: Thoracic;  Laterality: Right;  . PLEURADESIS Right 02/01/2016   Procedure: DOXYCYCLINE PLEURADESIS;  Surgeon: Ivin Poot, MD;  Location: [place];  Service: Thoracic;  Laterality: Right;  . PORTACATH PLACEMENT Right 03/06/2016   Procedure: INSERTION PORT-A-CATH;  Surgeon: Ivin Poot, MD;  Location: [place];  Service: Thoracic;  Laterality: Right;  . REMOVAL OF PLEURAL DRAINAGE CATHETER Right 07/08/2016   Procedure: REMOVAL OF PLEURAL DRAINAGE CATHETER;  Surgeon: Ivin Poot, MD;  Location: [place];  Service: Thoracic;  Laterality: Right;  . THORACENTESIS Right 01/25/2016  . VIDEO ASSISTED THORACOSCOPY Right 02/01/2016   Procedure: VIDEO ASSISTED THORACOSCOPY;  Surgeon: Ivin Poot, MD;  Location: [place];  Service: Thoracic;  Laterality: Right;  Marland Kitchen VIDEO BRONCHOSCOPY N/A 02/01/2016   Procedure: VIDEO BRONCHOSCOPY;  Surgeon: Ivin Poot, MD;  Location: [place];  Service: Thoracic;  Laterality: N/A;  . WISDOM TOOTH EXTRACTION     Social History:  reports that he has quit smoking. He has never used smokeless tobacco. He  reports that he does not drink alcohol or use drugs.   No Known Allergies  Family History  Problem Relation Age of Onset  . Cancer Father     oral  . COPD Maternal Grandmother    Prior to Admission medications   Medication Sig Start Date End Date Taking? Authorizing Provider  acetaminophen (TYLENOL) 325 MG tablet Take 2 tablets (650 mg total) by mouth every 6 (six) hours as needed for mild pain (or Fever >/= 101). 02/05/16  Yes Belkys A Regalado, MD  ALPRAZolam (XANAX) 0.25 MG tablet Take 2 tablets (0.5 mg total) by mouth 2 (two) times daily as needed for anxiety or sleep. 09/09/16   Maryann Mikhail, DO  Amino Acids-Protein Hydrolys (PRO-STAT) LIQD TAKE 30 MLS BY MOUTH 3 (THREE) TIMES DAILY BETWEEN MEALS. 09/16/16   Maren Reamer, MD  dexamethasone (DECADRON) 4 MG tablet 4 mg by mouth twice a day the day before, day of and day after the chemotherapy every 3 weeks 02/26/16   Curt Bears, MD  feeding supplement, ENSURE ENLIVE, (ENSURE ENLIVE) LIQD Take 237 mLs by mouth 2 (two) times daily between meals. Patient taking differently: Take 237 mLs by mouth 2 (two) times daily as needed (takes occasional).  02/05/16   Belkys A Regalado, MD  folic acid (FOLVITE) 1 MG tablet Take 1 tablet (1 mg total) by mouth daily. 08/07/16   Curt Bears, MD  HYDROcodone-acetaminophen (NORCO/VICODIN) 5-325 MG tablet Take 1-2 tablets by mouth every 4 (four) hours as needed for moderate pain. 06/09/16   Maren Reamer, MD  levalbuterol (XOPENEX) 0.63 MG/3ML nebulizer solution Take 3 mLs (0.63 mg total) by nebulization 2 (two) times daily. 03/10/16   Brayton Caves, PA-C  lidocaine-prilocaine (EMLA) cream Apply 1 application topically as needed. 02/26/16   Curt Bears, MD  metoprolol tartrate (LOPRESSOR) 25 MG tablet Take 1 tablet (25 mg total) by mouth 2 (two) times daily. 03/10/16   Brayton Caves, PA-C  Multiple Vitamins-Minerals (MULTIVITAMIN WITH MINERALS) tablet Take 1 tablet by mouth daily.    Historical  Provider, MD  polyethylene glycol (MIRALAX / GLYCOLAX) packet Take 17 g by mouth daily as needed for mild constipation. 02/05/16   Belkys A Regalado, MD  prochlorperazine (COMPAZINE) 10 MG tablet Take 1 tablet (10 mg total) by mouth every 6 (six) hours as needed for nausea or vomiting. 02/26/16   Curt Bears, MD  senna-docusate (SENOKOT S) 8.6-50 MG tablet Take 1-2 tablets by mouth daily as needed for mild constipation.    Historical Provider, MD  traMADol (ULTRAM) 50 MG tablet Take 1 tablet (50 mg total) by mouth every 6 (six) hours as needed (mild pain). 06/09/16   Maren Reamer, MD   Physical Exam: Vitals:   09/20/2016 1446 09/22/2016 1600 09/02/2016 1606 09/07/2016 1615  BP: 107/82 (!) 121/96  (!) 117/92  Pulse: (!) 117 (!) 127  (!) 129  Resp:  (!) 30  (!) 29  Temp:   98.2 F (36.8 C)   TempSrc:   Oral   SpO2: 94% 91%  100%  Weight:   61.2 kg (134 lb 14.7 oz)      General exam: Moderately built and nourished patient, lying comfortably supine on the gurney in no obvious distress but appears  markedly anxious.  Head, eyes and ENT: Nontraumatic and normocephalic. Pupils equally reacting to light and accommodation. Oral mucosa moist.  Neck: Supple. No JVD, carotid bruit or thyromegaly.  Lymphatics: No lymphadenopathy.  Respiratory system: tachypneic.  No increased work of breathing.  Cardiovascular system: S1 and S2 heard, tachycardic.  Gastrointestinal system: Abdomen is nondistended, soft and nontender. Normal bowel sounds heard. No organomegaly or masses appreciated.  Central nervous system: Alert and oriented. No focal neurological deficits.  Extremities: Symmetric 5 x 5 power. Peripheral pulses symmetrically felt.   Skin: No rashes or acute findings.  Musculoskeletal system: Negative exam.  Psychiatry: Pleasant and cooperative.   Labs on Admission:  Basic Metabolic Panel:  Recent Labs Lab 09/06/2016 1357  NA 132*  K 4.6  CL 90*  GLUCOSE 104*  BUN 19  CREATININE  0.70   Liver Function Tests: No results for input(s): AST, ALT, ALKPHOS, BILITOT, PROT, ALBUMIN in the last 168 hours. No results for input(s): LIPASE, AMYLASE in the last 168 hours. No results for input(s): AMMONIA in the last 168 hours. CBC:  Recent Labs Lab 09/23/2016 1335 09/13/2016 1357  WBC 14.3*  --   NEUTROABS 11.9*  --   HGB 14.1 16.0  HCT 43.2 47.0  MCV 93.7  --   PLT 342  --    Cardiac Enzymes: No results for input(s): CKTOTAL, CKMB, CKMBINDEX, TROPONINI in the last 168 hours.  BNP (last 3 results) No results for input(s): PROBNP in the last 8760 hours. CBG: No results for input(s): GLUCAP in the last 168 hours.  Radiological Exams on Admission: Ct Angio Chest Pe W Or Wo Contrast  Result Date: 09/03/2016 CLINICAL DATA:  Shortness of breath, history of lung carcinoma and known hydropneumothorax EXAM: CT ANGIOGRAPHY CHEST WITH CONTRAST TECHNIQUE: Multidetector CT imaging of the chest was performed using the standard protocol during bolus administration of intravenous contrast. Multiplanar CT image reconstructions and MIPs were obtained to evaluate the vascular anatomy. CONTRAST:  100 mL Isovue 370. COMPARISON:  09/08/2016, 07/21/2016 FINDINGS: Cardiovascular: The thoracic aorta shows mild ascending dilatation at 3.9 cm. The pulmonary artery shows a normal branching pattern on the left without filling defect to suggest pulmonary embolism. On the right, no significant cardiac enlargement is noted. Right chest wall port is seen. The visualized branches show no filling defects. Mediastinum/Nodes: Thoracic inlet is within normal limits. New 13 mm short axis lymph node is noted in the AP window. Esophagus is within normal limits. Lungs/Pleura: Large right hydropneumothorax is again identified and stable in appearance. Persisting consolidation in the right lower lobe is stable from the previous exam. The lungs however now demonstrate diffuse bilateral infiltrative changes and interstitial  thickening. These changes may represent interstitial and lymphangitis spread of neoplasm although there abrupt onset suggests a more infectious process. This would correspond with the patient's elevated white blood cell count. Small left pleural effusion is noted. The previously seen pleural track on the right is no longer identified. Upper Abdomen: Visualized upper abdomen is incompletely evaluated. No acute abnormality is seen. \ Musculoskeletal: No definitive bony metastatic disease is seen. Review of the MIP images confirms the above findings. IMPRESSION: Stable right hydropneumothorax and chronic changes in the right lung similar to that seen on the recent CT examination. No evidence of pulmonary emboli. Acute infiltrative changes noted throughout both lungs worse on the left than the right when compare with the prior exam from February 2018. This suggests some acute on chronic pneumonia with associated small left pleural effusion.  Followup following appropriate therapy is recommended. New AP window lymph node likely reactive in nature. Mildly prominent ascending aorta but relatively stable from the previous exam. Electronically Signed   By: Inez Catalina M.D.   On: 09/28/2016 15:57   Dg Chest Port 1 View  Result Date: 09/08/2016 CLINICAL DATA:  Shortness of breath EXAM: PORTABLE CHEST 1 VIEW COMPARISON:  09/13/2016, chest radiograph 09/08/2016 FINDINGS: Right-sided central venous port tip overlies the proximal right atrium. Worsening interstitial and alveolar opacity in the left upper and lower lung with probable small left effusion. Slight decreased right-sided hydropneumothorax. Stable cardiomediastinal silhouette. IMPRESSION: 1. Stable to slight decreased size of right hydropneumothorax compared with prior radiograph 09/08/2016 2. Diffuse interstitial and alveolar pulmonary opacity on the left, worse compared to radiograph 09/08/2016, grossly similar compared to CT chest 09/27/2016 Electronically Signed    By: Donavan Foil M.D.   On: 09/28/2016 16:19   EKG: Independently reviewed. tachycardia  Assessment/Plan Principal Problem:   HCAP (healthcare-associated pneumonia) Active Problems:   Stable Hydropneumothorax   Acute respiratory failure with hypoxia (H[facility])   Adenocarcinoma of right lung, stage 4 (H[facility])   Antineoplastic chemotherapy induced anemia   Protein-calorie malnutrition, severe   Leukocytosis  1. HCAP - likely contracted infection from recent hospitalization - would continue broad spectrum antibiotic coverage with cefepime and vancomycin, follow blood and sputum cultures, de-escalate antibiotics as soon as possible, continue oxygen support, follow chest xray, incentive spirometry, early ambulation recommended.   2. Stage 4 lung cancer - Pt to follow up with Dr. Earlie Server after discharge.  Sent notification to Dr. Earlie Server about admission.  3. Protein calorie malnutrition - severe - consulted dietitian.  4. Leukocytosis - secondary to pneumonia - follow CBC.   5. Stable Hydrothorax - stable from 2 recent xrays, don't suspect that this is causing the acute dyspnea that patient is reporting.  Follow up with Dr. Lucianne Lei trigt after discharge.  6. Hyponatremia - likely from mild dehydration, hydrate with NS and repeat testing in AM.  7. Chest pain - likely secondary to pneumonia, CT chest negative for PE and troponin 0.00. EKG no acute findings.     DVT Prophylaxis: lovenox, TED hoses Code Status: DNR/DNI Family Communication: bedside  Disposition Plan: home    Time spent: 10 mins  Irwin Brakeman, MD [facility] Pager [phone]  If 7PM-7AM, please contact night-coverage [email or web address] Password [email or web address] 09/11/2016, 4:40 PM

## 2016-09-25 NOTE — Progress Notes (Signed)
09/15/2016 7:05 PM  I called and spoke with patient's parents and explained to them that patient continuing to get worse and now is on bipap.  They said that they did not want him to be intubated and that they would want him to die naturally if it is the "lord's will."  I asked if they would come and see him as I think that he has progressively gotten worse and patient's mother verbalized understanding and said they would get ready and come up to see him.  I placed patient on bipap to see if he could improve with non-invasive therapy but will not intubate him.  I requested a stepdown bed for him.  I will sign out to night coverage.    Murvin Natal, MD

## 2016-09-25 NOTE — ED Notes (Signed)
Pt placed on bipap  

## 2016-09-25 NOTE — ED Notes (Addendum)
Pt noted as Sat decreased with tank depleted. Placed on wall O2 at 5l/[place] and sat increasing. Dr. Jeneen Rinks aware.

## 2016-09-26 DIAGNOSIS — Z515 Encounter for palliative care: Secondary | ICD-10-CM

## 2016-09-26 LAB — CBC
HCT: 36.1 % — ABNORMAL LOW (ref 39.0–52.0)
HEMOGLOBIN: 11.7 g/dL — AB (ref 13.0–17.0)
MCH: 29.8 pg (ref 26.0–34.0)
MCHC: 31.9 g/dL (ref 30.0–36.0)
MCV: 93.5 fL (ref 78.0–100.0)
PLATELETS: 340 10*3/uL (ref 150–400)
RBC: 3.86 MIL/uL — AB (ref 4.22–5.81)
RDW: 15.7 % — ABNORMAL HIGH (ref 11.5–15.5)
WBC: 14.3 10*3/uL — ABNORMAL HIGH (ref 4.0–10.5)

## 2016-09-26 LAB — BLOOD GAS, ARTERIAL
ACID-BASE EXCESS: 8.1 mmol/L — AB (ref 0.0–2.0)
Bicarbonate: 33.3 mmol/L — ABNORMAL HIGH (ref 20.0–28.0)
DELIVERY SYSTEMS: POSITIVE
DRAWN BY: 44135
Expiratory PAP: 6
FIO2: 40
INSPIRATORY PAP: 12
O2 Saturation: 94.3 %
PEEP: 6 cmH2O
Patient temperature: 98.6
RATE: 15 resp/min
pCO2 arterial: 58 mmHg — ABNORMAL HIGH (ref 32.0–48.0)
pH, Arterial: 7.378 (ref 7.350–7.450)
pO2, Arterial: 76.5 mmHg — ABNORMAL LOW (ref 83.0–108.0)

## 2016-09-26 LAB — HIV ANTIBODY (ROUTINE TESTING W REFLEX): HIV SCREEN 4TH GENERATION: NONREACTIVE

## 2016-09-26 LAB — COMPREHENSIVE METABOLIC PANEL
ALBUMIN: 2.4 g/dL — AB (ref 3.5–5.0)
ALT: 20 U/L (ref 17–63)
AST: 29 U/L (ref 15–41)
Alkaline Phosphatase: 85 U/L (ref 38–126)
Anion gap: 8 (ref 5–15)
BUN: 14 mg/dL (ref 6–20)
CHLORIDE: 93 mmol/L — AB (ref 101–111)
CO2: 33 mmol/L — AB (ref 22–32)
Calcium: 9 mg/dL (ref 8.9–10.3)
Creatinine, Ser: 0.76 mg/dL (ref 0.61–1.24)
GFR calc Af Amer: >60 mL/min (ref >60)
GFR calc non Af Amer: >60 mL/min (ref >60)
Glucose, Bld: 101 mg/dL — ABNORMAL HIGH (ref 65–99)
POTASSIUM: 4.7 mmol/L (ref 3.5–5.1)
SODIUM: 134 mmol/L — AB (ref 135–145)
Total Bilirubin: 0.4 mg/dL (ref 0.3–1.2)
Total Protein: 6.1 g/dL — ABNORMAL LOW (ref 6.5–8.1)

## 2016-09-26 LAB — MRSA PCR SCREENING: MRSA BY PCR: NEGATIVE

## 2016-09-26 LAB — STREP PNEUMONIAE URINARY ANTIGEN: STREP PNEUMO URINARY ANTIGEN: NEGATIVE

## 2016-09-26 LAB — GLUCOSE, CAPILLARY: GLUCOSE-CAPILLARY: 105 mg/dL — AB (ref 65–99)

## 2016-09-26 LAB — TROPONIN I: TROPONIN I: 0.03 ng/mL — AB (ref <0.03)

## 2016-09-26 MED ORDER — ORAL CARE MOUTH RINSE
15.0000 mL | Freq: Two times a day (BID) | OROMUCOSAL | Status: DC
  Administered 2016-09-26 – 2016-09-29 (×8): 15 mL via OROMUCOSAL

## 2016-09-26 MED ORDER — ENSURE ENLIVE PO LIQD
237.0000 mL | Freq: Three times a day (TID) | ORAL | Status: DC
  Administered 2016-09-26 – 2016-09-29 (×8): 237 mL via ORAL

## 2016-09-26 MED ORDER — MORPHINE SULFATE (CONCENTRATE) 10 MG/0.5ML PO SOLN: 5.0000 mg | ORAL | Status: DC | PRN

## 2016-09-26 MED ORDER — SODIUM CHLORIDE 0.9 % IV SOLN
INTRAVENOUS | Status: AC
  Administered 2016-09-26: 14:00:00 via INTRAVENOUS

## 2016-09-26 MED ORDER — MORPHINE SULFATE (PF) 4 MG/ML IV SOLN
1.0000 mg | INTRAVENOUS | Status: DC | PRN
  Administered 2016-09-26 – 2016-09-27 (×3): 2 mg via INTRAVENOUS
  Filled 2016-09-26 (×3): qty 1

## 2016-09-26 NOTE — Progress Notes (Signed)
PT Cancellation Note  Patient Details Name: Roger Barnes MRN: [id number] DOB: 1960/06/04   Cancelled Treatment:    Reason Eval/Treat Not Completed: Medical issues which prohibited therapy (Nurse states pt on Bipap. Needs rest. Will reattempt as able)   Denice Paradise 09/26/2016, 9:16 AM [facility] [phone] (pager)

## 2016-09-26 NOTE — Progress Notes (Signed)
OT Cancellation Note  Patient Details Name: CHRIS CRIPPS MRN: [id number] DOB: Dec 25, 1960   Cancelled Treatment:    Reason Eval/Treat Not Completed: Medical issues which prohibited therapy.  Pt currently on BiPAP.  Oluwatoni Rotunno Darby, OTR/L [phone]   Lucille Passy M 09/26/2016, 10:47 AM

## 2016-09-26 NOTE — Consult Note (Signed)
Consultation Note Date: 09/26/2016   Patient Name: Roger Barnes  DOB: 04-22-1961  MRN: [id number]  Age / Sex: 56 y.o., male  PCP: Maren Reamer, MD Referring Physician: Murlean Iba, MD  Reason for Consultation: Establishing goals of care, Hospice Evaluation, Non pain symptom management and Psychosocial/spiritual support  HPI/Patient Profile: 56 y.o. 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 He was recently discharged from [facility] on 4/10 for shortness of breath. Patient is followed by oncologist, Dr. Earlie Server,. In the past he has had a large right pleural effusion that was required thoracentesis. He also has a history of a Pleurx catheter being placed which was removed because it was not draining. CT surgery felt that his hydrothorax was stable and no intervention was needed at this time; repeat CT scan was suspicious for bilateral pneumonia.   Clinical Assessment and Goals of Care: Patient is very pleasant, wearing  BiPAP and still short of breath. He can only remove the mask previously to answer short questions. He is attempting to communicate by nodding yes or no. He understands that he has pneumonia and is hopeful that antibiotics might help him to feel better. He shares with me that his parents are his immediate family and he does live with them. He is unmarried without children.  Patient is currently decisional. His parents would be his healthcare proxy should this be needed    SUMMARY OF RECOMMENDATIONS   DNR/DNI Initial consultation was to introduce the concept of palliative care, establish a therapeutic rapport Patient at this point wishes to continue antibiotics and other treatment options in the hopes of improving his clinical status We'll  add low-dose opioids for usage for dyspnea as a tool to help aid getting off of BiPAP We'll need to address issues with patient such as resuming BiPAP, hospice, inpatient hospice Palliative medicine service to stay involved and involved parents in future decision making as well as disposition Code Status/Advance Care Planning:  DNR    Symptom Management:   Dyspnea: We'll add morphine concentrate 5 mg every 3 hours as needed for shortness of breath; morphine IV for more acute symptomology of 1-2 mg every 3 hours as needed for shortness of breath  Pain: We will discontinue Vicodin and utilize morphine concentrate or IV.  Palliative Prophylaxis:   Aspiration, Bowel Regimen, Delirium Protocol, Eye Care, Frequent Pain Assessment, Oral Care and Turn Reposition   Psycho-social/Spiritual:   Desire for further Chaplaincy support:no  Additional Recommendations: Grief/Bereavement Support  Prognosis:   < 4 weeks in the setting of stage IV lung cancer with recurrent right pleural effusions, hydrothorax, protein calorie malnutrition with an albumin of 2.4 and weight of 133 pounds. Patient will qualify for his hospice benefit both in the home as well as  residential hospice in my opinion  Discharge Planning: To Be Determined      Primary Diagnoses: Present on Admission: .  HCAP (healthcare-associated pneumonia) . Acute respiratory failure with hypoxia ([place]) . Adenocarcinoma of right lung, stage 4 ([place]) . Antineoplastic chemotherapy induced anemia . Leukocytosis . Protein-calorie malnutrition, severe . Stable Hydropneumothorax   I have reviewed the medical record, interviewed the patient and family, and examined the patient. The following aspects are pertinent.  Past Medical History:  Diagnosis Date  . Adenocarcinoma of right lung, stage 4 ([place])   . Anxiety   . Dyspnea   . Encounter for antineoplastic chemotherapy 02/26/2016  . Goals of care, counseling/discussion 07/23/2016  .  Pleural effusion, right 01/25/2016  . Tachycardia    Social History   Social History  . Marital status: Single    Spouse name: N/A  . Number of children: N/A  . Years of education: N/A   Social History Main Topics  . Smoking status: Former Research scientist (life sciences)  . Smokeless tobacco: Never Used     Comment: 01/25/2016 "haven't smoked since <2007"  . Alcohol use No  . Drug use: No  . Sexual activity: Not Currently   Other Topics Concern  . None   Social History Narrative  . None   Family History  Problem Relation Age of Onset  . Cancer Father     oral  . COPD Maternal Grandmother    Scheduled Meds: . enoxaparin (LOVENOX) injection  40 mg Subcutaneous Q24H  . feeding supplement (ENSURE ENLIVE)  237 mL Oral BID BM  . feeding supplement (PRO-STAT SUGAR FREE 64)  30 mL Oral BID  . folic acid  1 mg Oral Daily  . ipratropium-albuterol  3 mL Nebulization Q6H  . mouth rinse  15 mL Mouth Rinse BID  . metoprolol tartrate  25 mg Oral BID  . multivitamin with minerals  1 tablet Oral Daily  . sodium chloride flush  3 mL Intravenous Q12H   Continuous Infusions: . sodium chloride    . ceFEPime (MAXIPIME) IV Stopped (09/26/16 3419)  . vancomycin 750 mg (09/26/16 0943)   PRN Meds:.sodium chloride, acetaminophen **OR** acetaminophen, ALPRAZolam, bisacodyl, morphine injection, morphine CONCENTRATE, ondansetron **OR** ondansetron (ZOFRAN) IV, polyethylene glycol, prochlorperazine, sodium chloride flush, zolpidem Medications Prior to Admission:  Prior to Admission medications   Medication Sig Start Date End Date Taking? Authorizing Provider  acetaminophen (TYLENOL) 325 MG tablet Take 2 tablets (650 mg total) by mouth every 6 (six) hours as needed for mild pain (or Fever >/= 101). 02/05/16  Yes Belkys A Regalado, MD  ALPRAZolam (XANAX) 0.25 MG tablet Take 2 tablets (0.5 mg total) by mouth 2 (two) times daily as needed for anxiety or sleep. 09/09/16  Yes Maryann Mikhail, DO  Amino Acids-Protein Hydrolys  (PRO-STAT) LIQD TAKE 30 MLS BY MOUTH 3 (THREE) TIMES DAILY BETWEEN MEALS. 09/16/16  Yes Maren Reamer, MD  dexamethasone (DECADRON) 4 MG tablet 4 mg by mouth twice a day the day before, day of and day after the chemotherapy every 3 weeks 02/26/16  Yes Curt Bears, MD  feeding supplement, ENSURE ENLIVE, (ENSURE ENLIVE) LIQD Take 237 mLs by mouth 2 (two) times daily between meals. Patient taking differently: Take 237 mLs by mouth 2 (two) times daily as needed.  02/05/16  Yes Belkys A Regalado, MD  folic acid (FOLVITE) 1 MG tablet Take 1 tablet (1 mg total) by mouth daily. 08/07/16  Yes Curt Bears, MD  HYDROcodone-acetaminophen (NORCO/VICODIN) 5-325 MG tablet Take 1-2 tablets by mouth every 4 (four) hours as needed for moderate pain. Patient taking differently: Take 1-2 tablets by mouth every 4 (four) hours  as needed (for pain). MAX OF 8 TABLETS/24 HOURS 06/09/16  Yes Maren Reamer, MD  levalbuterol (XOPENEX) 0.63 MG/3ML nebulizer solution Take 3 mLs (0.63 mg total) by nebulization 2 (two) times daily. 03/10/16  Yes Tiffany Daneil Dan, PA-C  lidocaine-prilocaine (EMLA) cream Apply 1 application topically as needed. 02/26/16  Yes Curt Bears, MD  metoprolol tartrate (LOPRESSOR) 25 MG tablet Take 1 tablet (25 mg total) by mouth 2 (two) times daily. 03/10/16  Yes Tiffany Daneil Dan, PA-C  Multiple Vitamins-Minerals (MULTIVITAMIN WITH MINERALS) tablet Take 1 tablet by mouth daily.   Yes Historical Provider, MD  polyethylene glycol (MIRALAX / GLYCOLAX) packet Take 17 g by mouth daily as needed for mild constipation. 02/05/16  Yes Belkys A Regalado, MD  prochlorperazine (COMPAZINE) 10 MG tablet Take 1 tablet (10 mg total) by mouth every 6 (six) hours as needed for nausea or vomiting. 02/26/16  Yes Curt Bears, MD  senna-docusate (SENOKOT S) 8.6-50 MG tablet Take 1-2 tablets by mouth daily as needed for mild constipation.   Yes Historical Provider, MD  traMADol (ULTRAM) 50 MG tablet Take 1 tablet (50 mg total)  by mouth every 6 (six) hours as needed (mild pain). Patient taking differently: Take 50 mg by mouth every 6 (six) hours as needed (for pain).  06/09/16  Yes Maren Reamer, MD   No Known Allergies Review of Systems  Unable to perform ROS: Severe respiratory distress    Physical Exam  Constitutional: He is oriented to person, place, and time. He appears well-developed.  Acutely ill appearing older man, wearing BiPAP  HENT:  Head: Normocephalic and atraumatic.  Neck: Normal range of motion.  Cardiovascular:  Tachycardic  Pulmonary/Chest:  Increased work of breathing despite having BiPAP on  Musculoskeletal: Normal range of motion.  Neurological: He is alert and oriented to person, place, and time.  Skin: Skin is warm and dry.  Psychiatric:  Patient is difficult to communicate with secondary to BiPAP. He is unable to remove mask except for brief periods of time to answer yes and no. He is waving at me as well as giving me a thumbs up  Nursing note and vitals reviewed.   Vital Signs: BP (!) 129/94   Pulse (!) 106   Temp 97.4 F (36.3 C) (Axillary)   Resp (!) 36   Ht '5\' 10"'$  (1.778 m)   Wt 60.3 kg (133 lb)   SpO2 95%   BMI 19.08 kg/m  Pain Assessment: No/denies pain   Pain Score: 0-No pain   SpO2: SpO2: 95 % O2 Device:SpO2: 95 % O2 Flow Rate: .O2 Flow Rate (L/min): 5 L/min  IO: Intake/output summary:  Intake/Output Summary (Last 24 hours) at 09/26/16 1022 Last data filed at 09/26/16 0720  Gross per 24 hour  Intake              900 ml  Output              290 ml  Net              610 ml    LBM: Last BM Date: 09/29/2016 Baseline Weight: Weight: 61.2 kg (134 lb 14.7 oz) Most recent weight: Weight: 60.3 kg (133 lb)     Palliative Assessment/Data:   Flowsheet Rows     Most Recent Value  Intake Tab  Referral Department  Hospitalist  Unit at Time of Referral  Intermediate Care Unit  Palliative Care Primary Diagnosis  Cancer  Date Notified  09/26/16  Palliative Care  Type  New Palliative care  Reason for referral  Clarify Goals of Care, Non-pain Symptom, Psychosocial or Spiritual support  Date of Admission  09/24/2016  Date first seen by Palliative Care  09/26/16  # of days Palliative referral response time  0 Day(s)  # of days IP prior to Palliative referral  1  Clinical Assessment  Palliative Performance Scale Score  30%  Pain Max last 24 hours  Not able to report  Pain Min Last 24 hours  Not able to report  Dyspnea Max Last 24 Hours  Not able to report  Dyspnea Min Last 24 hours  Not able to report  Nausea Max Last 24 Hours  Not able to report  Nausea Min Last 24 Hours  Not able to report  Anxiety Max Last 24 Hours  Not able to report  Anxiety Min Last 24 Hours  Not able to report  Other Max Last 24 Hours  Not able to report  Psychosocial & Spiritual Assessment  Palliative Care Outcomes  Patient/Family wishes: Interventions discontinued/not started   Mechanical Ventilation  Palliative Care follow-up planned  No      Time In: 0920 Time Out: 1035 Time Total: 75 min Greater than 50%  of this time was spent counseling and coordinating care related to the above assessment and plan.Staffed with Dr. Wynetta Emery  Signed by: Dory Horn, NP   Please contact Palliative Medicine Team phone at [phone] for questions and concerns.  For individual provider: See Shea Evans

## 2016-09-26 NOTE — Progress Notes (Signed)
09/26/2016 5:04 PM  I came and met with patient's parents at bedside and they were with him and they were filling out paperwork with him.  He is on bipap but he is alert and he is able to communicate but still really short of breath and not able to come off of the bipap.  He is still tachypneic.  He says that the morphine is helping with his shortness of breath.  His prognosis remains poor and I am concerned about him.  I expressed with his parents.  They understand that he is seriously ill and likely does not have long to live.  He is appropriate for palliative/hospice services and I will continue to work with the palliative medicine team to establish clear meaningful goals of care. The patient wanted to eat today but unable to safely come off the bipap.  These are issues that we will need to address with him and his family if he is not able to come off bipap long enough to eat and drink. Will continue to follow.    Murvin Natal, MD

## 2016-09-26 NOTE — Care Management Note (Addendum)
Case Management Note  Patient Details  Name: Roger Barnes MRN: [id number] Date of Birth: Jan 01, 1961  Subjective/Objective:    Pt admitted with complications for lung CA - recently discharged           Action/Plan:   PTA from home alone  on supplemental oxygen.  Pt is being followed by Palliative Care here in hospital.  Discharge plan may be home with hospice or residential hospice.  CM will continue to follow for discharge needs   Expected Discharge Date:                  Expected Discharge Plan:     In-House Referral:  Clinical Social Work  Discharge planning Services  CM Consult  Post Acute Care Choice:    Choice offered to:     DME Arranged:    DME Agency:     HH Arranged:    [place] Agency:     Status of Service:     If discussed at [employer], dates discussed:    Additional Comments:  [name], RN 09/26/2016, 11:03 AM

## 2016-09-26 NOTE — Progress Notes (Signed)
PT Cancellation Note  Patient Details Name: Roger Barnes MRN: [id number] DOB: November 19, 1960   Cancelled Treatment:    Reason Eval/Treat Not Completed: Medical issues which prohibited therapy (Pt with RR in 30's.  Nursing asked PT to HOLD. )   Denice Paradise 09/26/2016, 2:41 PM Branton Einstein,PT Acute Rehabilitation [phone]

## 2016-09-26 NOTE — Progress Notes (Signed)
Respiration- 30's /min.  With slight sob, morphine given.

## 2016-09-26 NOTE — Progress Notes (Signed)
PROGRESS NOTE    Roger Barnes  TIR:[id number]  DOB: 1961/02/02  DOA: 08/31/2016 PCP: Maren Reamer, MD Outpatient Specialists:  Hospital course: 56 y.o. 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   Assessment & Plan:   1. Acute on chronic respiratory failure from stage 4 lung cancer with superimposed HCAP - likely contracted infection from recent hospitalization - would continue broad spectrum antibiotic coverage with cefepime and vancomycin, He has been placed on bipap for respiratory support and seems to be tolerating for now with some improvement in mentation and mental status, confirmed again with him DNR/DNI status but would like to continue to treat infection and follow clinically.  follow blood and sputum cultures, de-escalate antibiotics as soon as possible, continue oxygen support.    2. Stage 4 lung cancer - Pt seems to have had a significant decline in past 2 weeks after speaking with mother by phone, I asked for palliative medicine team to see him and they have started him on oral morphine to help with the shortness of breath, pain and discomfort.   Pt to follow up with Dr. Earlie Server after discharge.  Sent notification to Dr. Earlie Server about admission.  3. Protein calorie malnutrition - severe - secondary to stage 4 lung cancer, consulted dietitian.  4. Leukocytosis - secondary to pneumonia - follow CBC.   5. Stable Hydrothorax - stable from 2 recent xrays, don't suspect that this is causing the acute dyspnea that patient is reporting.  Follow up with Dr. Lucianne Lei trigt after discharge.  6. Hyponatremia - likely from mild dehydration, Improved with NS.  7. Chest pain - Pt says that symptoms are improving, likely secondary to pneumonia and lung cancer, CT chest negative for PE and troponin 0.00. EKG no acute findings.     DVT Prophylaxis: lovenox,  TED hoses Code Status: DNR/DNI Family Communication: phone  Disposition Plan: TBD   Subjective: Pt was seen on bipap but more alert and able to answer questions  Objective: Vitals:   09/26/16 0821 09/26/16 1125 09/26/16 1227 09/26/16 1303  BP:    (!) 132/97  Pulse:    (!) 108  Resp:    (!) 35  Temp: 97.4 F (36.3 C) 97.8 F (36.6 C) 97.5 F (36.4 C)   TempSrc: Axillary Axillary Axillary   SpO2:    92%  Weight:      Height:        Intake/Output Summary (Last 24 hours) at 09/26/16 1353 Last data filed at 09/26/16 0720  Gross per 24 hour  Intake              900 ml  Output              290 ml  Net              610 ml   Filed Weights   09/24/2016 1606 09/01/2016 2222  Weight: 61.2 kg (134 lb 14.7 oz) 60.3 kg (133 lb)    Exam:  General exam: cachectic male, on bipap, awake, alert. Cooperative.  Respiratory system: shallow breathing bilateral Cardiovascular system: S1 & S2 heard, tachycardic. No JVD, murmurs, gallops, clicks or pedal edema. Gastrointestinal system: Abdomen is nondistended, soft and nontender. Normal bowel sounds heard. Central nervous system: Alert and oriented. No focal neurological deficits. Extremities: no CCE.  Data Reviewed: Basic Metabolic Panel:  Recent Labs Lab 09/20/2016 1357  09/29/2016 2247 09/26/16 0413  NA 132*  --  134*  K 4.6  --  4.7  CL 90*  --  93*  CO2  --   --  33*  GLUCOSE 104*  --  101*  BUN 19  --  14  CREATININE 0.70 0.79 0.76  CALCIUM  --   --  9.0   Liver Function Tests:  Recent Labs Lab 09/26/16 0413  AST 29  ALT 20  ALKPHOS 85  BILITOT 0.4  PROT 6.1*  ALBUMIN 2.4*   No results for input(s): LIPASE, AMYLASE in the last 168 hours. No results for input(s): AMMONIA in the last 168 hours. CBC:  Recent Labs Lab 09/15/2016 1335 09/21/2016 1357 09/26/16 0413  WBC 14.3*  --  14.3*  NEUTROABS 11.9*  --   --   HGB 14.1 16.0 11.7*  HCT 43.2 47.0 36.1*  MCV 93.7  --  93.5  PLT 342  --  340   Cardiac  Enzymes:  Recent Labs Lab 09/11/2016 2247 09/26/16 0413 09/26/16 1125  TROPONINI 0.03* 0.03* <0.03   CBG (last 3)   Recent Labs  09/26/16 1124  GLUCAP 105*   Recent Results (from the past 240 hour(s))  Culture, blood (Routine X 2) w Reflex to ID Panel     Status: None (Preliminary result)   Collection Time: 09/15/2016  4:50 PM  Result Value Ref Range Status   Specimen Description BLOOD SOURCE UNKNOWN  Final   Special Requests   Final    BOTTLES DRAWN AEROBIC AND ANAEROBIC Blood Culture adequate volume   Culture NO GROWTH < 24 HOURS  Final   Report Status PENDING  Incomplete  Culture, blood (Routine X 2) w Reflex to ID Panel     Status: None (Preliminary result)   Collection Time: 09/02/2016  5:04 PM  Result Value Ref Range Status   Specimen Description BLOOD RIGHT ANTECUBITAL  Final   Special Requests   Final    BOTTLES DRAWN AEROBIC AND ANAEROBIC Blood Culture adequate volume   Culture NO GROWTH < 24 HOURS  Final   Report Status PENDING  Incomplete  MRSA PCR Screening     Status: None   Collection Time: 09/28/2016 10:30 PM  Result Value Ref Range Status   MRSA by PCR NEGATIVE NEGATIVE Final    Comment:        The GeneXpert MRSA Assay (FDA approved for NASAL specimens only), is one component of a comprehensive MRSA colonization surveillance program. It is not intended to diagnose MRSA infection nor to guide or monitor treatment for MRSA infections.      Studies: Ct Head Wo Contrast  Result Date: 09/24/2016 CLINICAL DATA:  Unresponsive patient EXAM: CT HEAD WITHOUT CONTRAST TECHNIQUE: Contiguous axial images were obtained from the base of the skull through the vertex without intravenous contrast. COMPARISON:  MRI  01/30/2016 FINDINGS: Brain: No evidence of acute infarction, hemorrhage, hydrocephalus, extra-axial collection or mass lesion/mass effect. Few periventricular white matter hypodensities consistent with small vessel change. Vascular: No hyperdense vessels.  Scattered calcifications at the carotid siphons. Skull: No fracture or suspicious bone lesion. Sinuses/Orbits: No acute finding. Other: None IMPRESSION: No CT evidence for acute intracranial abnormality. Electronically Signed   By: Donavan Foil M.D.   On: 09/04/2016 20:13   Ct Angio Chest Pe W Or Wo Contrast  Result Date: 09/13/2016 CLINICAL DATA:  Shortness of breath, history of lung carcinoma and known hydropneumothorax EXAM: CT ANGIOGRAPHY CHEST WITH CONTRAST TECHNIQUE: Multidetector CT imaging of  the chest was performed using the standard protocol during bolus administration of intravenous contrast. Multiplanar CT image reconstructions and MIPs were obtained to evaluate the vascular anatomy. CONTRAST:  100 mL Isovue 370. COMPARISON:  09/08/2016, 07/21/2016 FINDINGS: Cardiovascular: The thoracic aorta shows mild ascending dilatation at 3.9 cm. The pulmonary artery shows a normal branching pattern on the left without filling defect to suggest pulmonary embolism. On the right, no significant cardiac enlargement is noted. Right chest wall port is seen. The visualized branches show no filling defects. Mediastinum/Nodes: Thoracic inlet is within normal limits. New 13 mm short axis lymph node is noted in the AP window. Esophagus is within normal limits. Lungs/Pleura: Large right hydropneumothorax is again identified and stable in appearance. Persisting consolidation in the right lower lobe is stable from the previous exam. The lungs however now demonstrate diffuse bilateral infiltrative changes and interstitial thickening. These changes may represent interstitial and lymphangitis spread of neoplasm although there abrupt onset suggests a more infectious process. This would correspond with the patient's elevated white blood cell count. Small left pleural effusion is noted. The previously seen pleural track on the right is no longer identified. Upper Abdomen: Visualized upper abdomen is incompletely evaluated. No  acute abnormality is seen. \ Musculoskeletal: No definitive bony metastatic disease is seen. Review of the MIP images confirms the above findings. IMPRESSION: Stable right hydropneumothorax and chronic changes in the right lung similar to that seen on the recent CT examination. No evidence of pulmonary emboli. Acute infiltrative changes noted throughout both lungs worse on the left than the right when compare with the prior exam from February 2018. This suggests some acute on chronic pneumonia with associated small left pleural effusion. Followup following appropriate therapy is recommended. New AP window lymph node likely reactive in nature. Mildly prominent ascending aorta but relatively stable from the previous exam. Electronically Signed   By: Inez Catalina M.D.   On: 09/05/2016 15:57   Dg Chest Port 1 View  Result Date: 09/19/2016 CLINICAL DATA:  Shortness of breath EXAM: PORTABLE CHEST 1 VIEW COMPARISON:  09/24/2016, chest radiograph 09/08/2016 FINDINGS: Right-sided central venous port tip overlies the proximal right atrium. Worsening interstitial and alveolar opacity in the left upper and lower lung with probable small left effusion. Slight decreased right-sided hydropneumothorax. Stable cardiomediastinal silhouette. IMPRESSION: 1. Stable to slight decreased size of right hydropneumothorax compared with prior radiograph 09/08/2016 2. Diffuse interstitial and alveolar pulmonary opacity on the left, worse compared to radiograph 09/08/2016, grossly similar compared to CT chest 09/21/2016 Electronically Signed   By: Donavan Foil M.D.   On: 09/23/2016 16:19     Scheduled Meds: . enoxaparin (LOVENOX) injection  40 mg Subcutaneous Q24H  . feeding supplement (ENSURE ENLIVE)  237 mL Oral BID BM  . feeding supplement (PRO-STAT SUGAR FREE 64)  30 mL Oral BID  . folic acid  1 mg Oral Daily  . ipratropium-albuterol  3 mL Nebulization Q6H  . mouth rinse  15 mL Mouth Rinse BID  . metoprolol tartrate  25 mg  Oral BID  . multivitamin with minerals  1 tablet Oral Daily  . sodium chloride flush  3 mL Intravenous Q12H   Continuous Infusions: . sodium chloride    . ceFEPime (MAXIPIME) IV 1 g (09/26/16 1316)  . vancomycin Stopped (09/26/16 1043)    Principal Problem:   HCAP (healthcare-associated pneumonia) Active Problems:   Stable Hydropneumothorax   Acute respiratory failure with hypoxia ([place])   Adenocarcinoma of right lung, stage 4 ([facility])  Antineoplastic chemotherapy induced anemia   Protein-calorie malnutrition, severe   Leukocytosis   Palliative care by specialist   Time spent:   Irwin Brakeman, MD, FAAFP [facility] Pager [phone]  If 7PM-7AM, please contact night-coverage [email or web address] Password [email or web address] 09/26/2016, 1:53 PM    LOS: 1 day

## 2016-09-26 NOTE — Progress Notes (Signed)
Initial Nutrition Assessment  DOCUMENTATION CODES:   Severe malnutrition in context of chronic illness, Underweight  INTERVENTION:   -Increase Ensure Enlive po to TID, each supplement provides 350 kcal and 20 grams of protein -D/c Prostat -MVI daily  NUTRITION DIAGNOSIS:   Malnutrition (severe) related to chronic illness (stage IV lung cancer) as evidenced by severe depletion of body fat, severe depletion of muscle mass.  GOAL:   Patient will meet greater than or equal to 90% of their needs  MONITOR:   PO intake, Supplement acceptance, Labs, Weight trends, Skin, I & O's  REASON FOR ASSESSMENT:   Consult, Malnutrition Screening Tool Assessment of nutrition requirement/status  ASSESSMENT:   Roger Barnes is a 56 y.o. 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  He had a previous pleurex placed by Dr. Nils Pyle.  It was removed after completing his last cycle of chemo as it was not draining per patient  Pt admitted with HCAP.   Pt on Bi-pap and sleepy at time of visit. RD did not wake to elicit further hx.   Pt is familiar to this RD due to previous admission. In the past, he has been trying to work in increasing protein intake by consuming Ensure shakes, [place], and cube steak. He consumes multiple frequent meals in light of early satiety. He also takes many vitamins, including a dissolvable MVI which he prefers over the traditional MVI due to ease in swallowing.   Reviewed wt hx; noted pt has experienced a 10.1% wt loss over the past 3 months, which is significant for time frame.   Nutrition-Focused physical exam completed. Findings are severe fat depletion, severe muscle depletion, and mild edema.   Palliative care team following to elicit further goals of care.   Labs reviewed: Na: 134 (on IV supplementation).   Diet Order:  Diet regular Room service appropriate? Yes; Fluid consistency:  Thin  Skin:  Wound (see comment) (stage II sacrum)  Last BM:  09/05/2016  Height:   Ht Readings from Last 1 Encounters:  09/28/2016 '5\' 10"'$  (1.778 m)    Weight:   Wt Readings from Last 1 Encounters:  09/20/2016 133 lb (60.3 kg)    Ideal Body Weight:  75.5 kg  BMI:  Body mass index is 19.08 kg/m.  Estimated Nutritional Needs:   Kcal:  1900-2100  Protein:  95-110 grams  Fluid:  1.9-2.1 L  EDUCATION NEEDS:   No education needs identified at this time  Analisse Randle A. Jimmye Norman, RD, LDN, CDE Pager: [phone] After hours Pager: [phone]

## 2016-09-26 NOTE — Progress Notes (Signed)
RT NOTE:  Pt placed on BIPAP for distress. Pt does not want to keep mask on and tries to pull off. RT explained if he continues to do this he will exhaust himself and may need more invasive therapy. Pt agrees to leave mask on.

## 2016-09-27 LAB — BASIC METABOLIC PANEL
ANION GAP: 8 (ref 5–15)
BUN: 13 mg/dL (ref 6–20)
CALCIUM: 8.9 mg/dL (ref 8.9–10.3)
CO2: 36 mmol/L — ABNORMAL HIGH (ref 22–32)
Chloride: 90 mmol/L — ABNORMAL LOW (ref 101–111)
Creatinine, Ser: 0.95 mg/dL (ref 0.61–1.24)
GFR calc Af Amer: >60 mL/min (ref >60)
GLUCOSE: 106 mg/dL — AB (ref 65–99)
Potassium: 4.6 mmol/L (ref 3.5–5.1)
SODIUM: 134 mmol/L — AB (ref 135–145)

## 2016-09-27 LAB — CBC
HCT: 37 % — ABNORMAL LOW (ref 39.0–52.0)
Hemoglobin: 11.7 g/dL — ABNORMAL LOW (ref 13.0–17.0)
MCH: 30.2 pg (ref 26.0–34.0)
MCHC: 31.6 g/dL (ref 30.0–36.0)
MCV: 95.6 fL (ref 78.0–100.0)
PLATELETS: 303 10*3/uL (ref 150–400)
RBC: 3.87 MIL/uL — ABNORMAL LOW (ref 4.22–5.81)
RDW: 15.7 % — AB (ref 11.5–15.5)
WBC: 11.1 10*3/uL — ABNORMAL HIGH (ref 4.0–10.5)

## 2016-09-27 MED ORDER — ALPRAZOLAM 0.25 MG PO TABS
0.2500 mg | ORAL_TABLET | Freq: Three times a day (TID) | ORAL | Status: DC
  Administered 2016-09-27 – 2016-09-29 (×6): 0.25 mg via ORAL
  Filled 2016-09-27 (×8): qty 1

## 2016-09-27 MED ORDER — LORAZEPAM 2 MG/ML IJ SOLN
1.0000 mg | INTRAMUSCULAR | Status: DC | PRN
Start: 2016-09-27 — End: 2016-09-30
  Administered 2016-09-29 – 2016-09-30 (×2): 1 mg via INTRAVENOUS
  Filled 2016-09-27 (×2): qty 1

## 2016-09-27 MED ORDER — MORPHINE SULFATE (PF) 4 MG/ML IV SOLN
2.0000 mg | INTRAVENOUS | Status: DC
Start: 2016-09-27 — End: 2016-09-28
  Administered 2016-09-27 – 2016-09-28 (×6): 2 mg via INTRAVENOUS
  Filled 2016-09-27 (×7): qty 1

## 2016-09-27 MED ORDER — BISACODYL 5 MG PO TBEC
5.0000 mg | DELAYED_RELEASE_TABLET | Freq: Every day | ORAL | Status: DC
  Administered 2016-09-27 – 2016-09-29 (×3): 5 mg via ORAL
  Filled 2016-09-27 (×3): qty 1

## 2016-09-27 NOTE — Progress Notes (Signed)
PT Cancellation Note  Patient Details Name: Roger Barnes MRN: [id number] DOB: 1961-03-14   Cancelled Treatment:    Reason Eval/Treat Not Completed: Patient at procedure or test/unavailable Following up this afternoon, patient receiving breathing treatment while sleeping comfortably. Will check back tomorrow for evaluation as appropriate.  Ellouise Newer 09/27/2016, 3:07 PM   Elayne Snare, [place], DPT [phone]

## 2016-09-27 NOTE — Progress Notes (Signed)
PT Cancellation Note  Patient Details Name: Roger Barnes MRN: [id number] DOB: 11-08-1960   Cancelled Treatment:    Reason Eval/Treat Not Completed: Medical issues which prohibited therapy Spoke with RN, currently trying to wean pt off bi-pap and is on hi-flo O2 at the moment. We'll check back this afternoon, as hopefully pt will better tolerate brief PT evaluation to assess needs for patient and family at d/c.  Ellouise Newer 09/27/2016, 10:03 AM  Elayne Snare, [place]

## 2016-09-27 NOTE — Evaluation (Signed)
Occupational Therapy Evaluation Patient Details Name: Roger Barnes MRN: [id number] DOB: [birth year] Today's Date: 09/27/2016    History of Present Illness This 56 y.o. male admitted with SOB, and bil. chest wall pain.  CT supsicious for bil. PNA.  PMH includes:  stage IV lung CA with recurrent hydrothorax, plaural effusions.     Clinical Impression   Pt admitted with above. He demonstrates the below listed deficits and will benefit from continued OT to maximize safety and independence with BADLs.  Pt limited to sitting EOB only x 3-4 mins with DOE progressing to 4/4, sats 88% and RR 47 on 7L hi flo 02.  Once pt returned to supine, sats rebounded to mid 90s.  Pursed lip breathing encouraged.   Will follow acutely.       Follow Up Recommendations  No OT follow up - pt for possible transfer to residential facility    Equipment Recommendations  None recommended by OT    Recommendations for Other Services       Precautions / Restrictions Precautions Precautions: Fall Precaution Comments: high 02 requirements       Mobility Bed Mobility Overal bed mobility: Needs Assistance Bed Mobility: Supine to Sit;Sit to Supine     Supine to sit: Min assist Sit to supine: Min assist   General bed mobility comments: assist to lift trunk from bed and lift LEs onto bed   Transfers                 General transfer comment: unable to attempt due to DOE EOB     Balance Overall balance assessment: Needs assistance Sitting-balance support: Feet supported Sitting balance-Leahy Scale: Fair Sitting balance - Comments: sat EOB 3-4 mins with DOE 3/4 progressing to 4/4.  02 sats decreasing to 88% and RR 47 on 7L Hi Flo 02                                   ADL either performed or assessed with clinical judgement   ADL Overall ADL's : Needs assistance/impaired Eating/Feeding: Set up;Bed level   Grooming: Wash/dry hands;Wash/dry face;Oral care;Brushing hair;Minimal  assistance;Bed level   Upper Body Bathing: Maximal assistance;Bed level   Lower Body Bathing: Maximal assistance;Bed level   Upper Body Dressing : Maximal assistance;Bed level   Lower Body Dressing: Total assistance;Bed level   Toilet Transfer: Total assistance Toilet Transfer Details (indicate cue type and reason): unable to attempt due to [place] and Hygiene: Maximal assistance;Bed level         General ADL Comments: pt limited by  DOE 4/4     Vision         Perception     Praxis      Pertinent Vitals/Pain Pain Assessment: No/denies pain     Hand Dominance Right   Extremity/Trunk Assessment Upper Extremity Assessment Upper Extremity Assessment: Generalized weakness   Lower Extremity Assessment Lower Extremity Assessment: Generalized weakness   Cervical / Trunk Assessment Cervical / Trunk Assessment: Kyphotic   Communication Communication Communication: Other (comment) (low volume )   Cognition Arousal/Alertness: Lethargic Behavior During Therapy: Flat affect Overall Cognitive Status: Within Functional Limits for tasks assessed                                     General Comments  Exercises     Shoulder Instructions      Home Living Family/patient expects to be discharged to:: Unsure                                 Additional Comments: unsure at this time       Prior Functioning/Environment Level of Independence: Needs assistance        Comments: Pt reports he has had steady decline in function.  He was unable to ambulate, used BSC, and parents assisted him         OT Problem List: Decreased strength;Impaired balance (sitting and/or standing);Decreased activity tolerance;Decreased knowledge of use of DME or AE;Cardiopulmonary status limiting activity      OT Treatment/Interventions: Self-care/ADL training;Therapeutic exercise;Energy conservation;DME and/or AE  instruction;Therapeutic activities;Patient/family education    OT Goals(Current goals can be found in the care plan section) Acute Rehab OT Goals Patient Stated Goal: to increase endurance  OT Goal Formulation: With patient Time For Goal Achievement: 10/11/16 Potential to Achieve Goals: Fair ADL Goals Pt Will Perform Grooming: with set-up;sitting Pt Will Transfer to Toilet: with min assist;stand pivot transfer;bedside commode Pt Will Perform Toileting - Clothing Manipulation and hygiene: with min assist;sit to/from stand  OT Frequency: Min 2X/week   Barriers to D/C: Decreased caregiver support          Co-evaluation              End of Session Nurse Communication: Mobility status  Activity Tolerance: Patient limited by lethargy;Patient limited by fatigue Patient left: in bed;with call bell/phone within reach;with bed alarm set  OT Visit Diagnosis: Muscle weakness (generalized) (M62.81)                Time: 9924-2683 OT Time Calculation (min): 12 min Charges:  OT General Charges $OT Visit: 1 Procedure OT Evaluation $OT Eval Moderate Complexity: 1 Procedure G-Codes:     Lucille Passy, OTR/L [phone]   Lucille Passy M 09/27/2016, 5:04 PM

## 2016-09-27 NOTE — Progress Notes (Addendum)
Daily Progress Note   Patient Name: Roger Barnes       Date: 09/27/2016 DOB: March 07, 1961  Age: 56 y.o. MRN#: [id number] Attending Physician: Murlean Iba, MD Primary Care Physician: Maren Reamer, MD Admit Date: 09/19/2016  Reason for Consultation/Follow-up: Establishing goals of care, Non pain symptom management, Pain control and Psychosocial/spiritual support  Subjective: Patient is still having difficulty getting off of BiPAP. He has started using morphine and has verbalized that it's helping him to feel less breathless. Per respiratory therapy, he was off BiPAP for 2 hours last night. He is unable to tolerate at this point staying off longer in order to eat or even drink.  Length of Stay: 2  Current Medications: Scheduled Meds:  . ALPRAZolam  0.25 mg Oral TID  . bisacodyl  5 mg Oral Daily  . enoxaparin (LOVENOX) injection  40 mg Subcutaneous Q24H  . feeding supplement (ENSURE ENLIVE)  237 mL Oral TID BM  . ipratropium-albuterol  3 mL Nebulization Q6H  . mouth rinse  15 mL Mouth Rinse BID  . metoprolol tartrate  25 mg Oral BID  .  morphine injection  2 mg Intravenous Q4H  . sodium chloride flush  3 mL Intravenous Q12H    Continuous Infusions: . sodium chloride    . sodium chloride 50 mL/hr at 09/26/16 1429  . ceFEPime (MAXIPIME) IV Stopped (09/27/16 0531)    PRN Meds: sodium chloride, acetaminophen **OR** acetaminophen, ALPRAZolam, LORazepam, morphine injection, morphine CONCENTRATE, ondansetron **OR** ondansetron (ZOFRAN) IV, polyethylene glycol, prochlorperazine, sodium chloride flush, zolpidem  Physical Exam  Constitutional: He is oriented to person, place, and time.  Cachetic   HENT:  Head: Normocephalic and atraumatic.  Neck: Normal range of motion.    Cardiovascular:  tachycardic  Pulmonary/Chest:  Increased work of breathing Off BiPaP for 2 hours last night  Musculoskeletal: Normal range of motion.  Neurological: He is alert and oriented to person, place, and time.  Skin: Skin is warm and dry.  Psychiatric:  anxious  Nursing note and vitals reviewed.           Vital Signs: BP (!) 115/93 (BP Location: Left Arm)   Pulse (!) 104   Temp 97 F (36.1 C) (Axillary)   Resp (!) 22   Ht '5\' 10"'$  (1.778 m)   Wt 60.3 kg (  133 lb)   SpO2 98%   BMI 19.08 kg/m  SpO2: SpO2: 98 % O2 Device: O2 Device: High Flow Nasal Cannula O2 Flow Rate: O2 Flow Rate (L/min): 10 L/min  Intake/output summary:  Intake/Output Summary (Last 24 hours) at 09/27/16 4081 Last data filed at 09/27/16 0600  Gross per 24 hour  Intake             1250 ml  Output              600 ml  Net              650 ml   LBM: Last BM Date: 09/04/2016 Baseline Weight: Weight: 61.2 kg (134 lb 14.7 oz) Most recent weight: Weight: 60.3 kg (133 lb)       Palliative Assessment/Data:    Flowsheet Rows     Most Recent Value  Intake Tab  Referral Department  Hospitalist  Unit at Time of Referral  Intermediate Care Unit  Palliative Care Primary Diagnosis  Cancer  Date Notified  09/26/16  Palliative Care Type  New Palliative care  Reason for referral  Clarify Goals of Care, Non-pain Symptom, Psychosocial or Spiritual support  Date of Admission  09/24/2016  Date first seen by Palliative Care  09/26/16  # of days Palliative referral response time  0 Day(s)  # of days IP prior to Palliative referral  1  Clinical Assessment  Palliative Performance Scale Score  30%  Pain Max last 24 hours  Not able to report  Pain Min Last 24 hours  Not able to report  Dyspnea Max Last 24 Hours  Not able to report  Dyspnea Min Last 24 hours  Not able to report  Nausea Max Last 24 Hours  Not able to report  Nausea Min Last 24 Hours  Not able to report  Anxiety Max Last 24 Hours  Not able to  report  Anxiety Min Last 24 Hours  Not able to report  Other Max Last 24 Hours  Not able to report  Psychosocial & Spiritual Assessment  Palliative Care Outcomes  Patient/Family wishes: Interventions discontinued/not started   Mechanical Ventilation  Palliative Care follow-up planned  No      Patient Active Problem List   Diagnosis Date Noted  . Palliative care by specialist   . Healthcare-associated pneumonia 09/24/2016  . Leukocytosis 09/24/2016  . Pressure injury of skin 09/09/2016  . Protein-calorie malnutrition, severe 09/09/2016  . Tachycardia 09/08/2016  . Goals of care, counseling/discussion 07/23/2016  . Antineoplastic chemotherapy induced anemia 05/07/2016  . Stuffy and runny nose 05/07/2016  . Encounter for antineoplastic chemotherapy 02/26/2016  . Adenocarcinoma of right lung, stage 4 ([place])   . Acute respiratory failure with hypoxia ([place])   . Atelectasis   . Stable Hydropneumothorax 01/25/2016  . Elevated d-dimer 01/25/2016  . Abnormal EKG 01/25/2016  . Pleural effusion, right   . Chronic Dyspnea     Palliative Care Assessment & Plan   Patient Profile:  56 y.o. male  with past medical history of Stage IV lung cancer with recurrent hydrothorax, pleural effusions admitted on 09/26/2016 with shortness of breath and bilateral chest wall pain. He was recently discharged from [facility] on 4/10 for shortness of breath. Patient is followed by oncologist, Dr. Earlie Server,. In the past he has had a large right pleural effusion that was required thoracentesis. He also has a history of a Pleurx catheter being placed which was removed because it was not draining. CT surgery  felt that his hydrothorax was stable and no intervention was needed at this time; repeat CT scan was suspicious for bilateral pneumonia.    Assessment: Spoke to patient about starting morphine on a scheduled basis to help him get off of BiPAP and transitioned to high flow nasal cannula or even a face  mask. He was agreeable to this. He does verbalize again that it does help him to breathe. Introduced the concept of a continuous infusion which may be advantageous. Patient also has been quite anxious given his level of dyspnea and is agreeable to scheduling Xanax  Recommendations/Plan:  Dyspnea: We'll start scheduled morphine at 2 mg IV every 4 hours on a scheduled basis and 1-2 mg every 2 hours as needed for shortness of breath. We'll monitor overnight, with potential goal to start a continuous infusion tomorrow  Anxiety: We'll schedule Xanax 0.25 mg 3 times a day; additionally we'll add Ativan IV as needed for an injectable option if he is too short of breath to take a pill  After we get patient off of BiPAP we can begin to discuss goals of care specifically inpatient hospice would be my recommendation. Patient lives with elderly parents, patient is highly symptomatic and could be difficult to manage with hospice coming to the home even   Goals of Care and Additional Recommendations:  Limitations on Scope of Treatment: Minimize Medications, No Artificial Feeding, No Chemotherapy, No Surgical Procedures and No Tracheostomy  Code Status:    Code Status Orders        Start     Ordered   09/26/16 0724  Do not attempt resuscitation (DNR)  Continuous    Question Answer Comment  In the event of cardiac or respiratory ARREST Do not call a "code blue"   In the event of cardiac or respiratory ARREST Do not perform Intubation, CPR, defibrillation or ACLS   In the event of cardiac or respiratory ARREST Use medication by any route, position, wound care, and other measures to relive pain and suffering. May use oxygen, suction and manual treatment of airway obstruction as needed for comfort.      09/26/16 0723    Code Status History    Date Active Date Inactive Code Status Order ID Comments User Context   09/11/2016 10:28 PM 09/26/2016  7:23 AM Full Code [id number]  Murlean Iba, MD Inpatient     09/10/2016 10:28 PM 09/06/2016 10:28 PM Full Code [id number]  Murlean Iba, MD Inpatient   09/18/2016  7:09 PM 09/23/2016 10:28 PM DNR [id number]  Murlean Iba, MD ED   09/08/2016  6:37 PM 09/09/2016  7:52 PM Full Code [id number]  Geradine Girt, DO Inpatient   01/25/2016 11:29 AM 02/05/2016  9:36 PM Full Code [id number]  Willia Craze, NP ED       Prognosis:   < 4 weeks in the setting of stage IV lung cancer with recurrent hydrothorax, pleural effusions. Patient is at high risk for an acute cardiopulmonary event  Discharge Planning:  To Be Determined  Care plan was discussed with Dr. Wynetta Emery  Thank you for allowing the Palliative Medicine Team to assist in the care of this patient.   Time In: 0900 Time Out: 0940 Total Time 40 min Prolonged Time Billed  no       Greater than 50%  of this time was spent counseling and coordinating care related to the above assessment and plan.  Dory Horn, NP  Please contact Palliative Medicine Team  phone at [phone] for questions and concerns.

## 2016-09-27 NOTE — Progress Notes (Signed)
PROGRESS NOTE    Roger Barnes  LOV:[id number]  DOB: 1960-09-06  DOA: 09/03/2016 PCP: Maren Reamer, MD Outpatient Specialists:  Hospital course: 56 y.o. 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   Assessment & Plan:   1. Acute on chronic respiratory failure from stage 4 lung cancer with superimposed HCAP - likely contracted infection from recent hospitalization - would continue broad spectrum antibiotic coverage with cefepime and vancomycin, He has been placed on bipap for respiratory support and seems to be tolerating for now with some improvement in mentation and mental status, confirmed again with him DNR/DNI status but would like to continue to treat infection and follow clinically.  follow blood and sputum cultures no growth to date.  DC vancomycin MRSA pcr negative. Continue Zosyn.   2. Stage 4 lung cancer - Pt seems to have had a significant decline in past 2 weeks after speaking with mother by phone, I asked for palliative medicine team to see him and they have started him on oral morphine to help with the shortness of breath, pain and discomfort.  They are working with nursing and RT to get him off bipap.   3. Protein calorie malnutrition - severe - secondary to stage 4 lung cancer, consulted dietitian.  4. Leukocytosis - secondary to pneumonia - follow CBC.  WBC trending down.  5. Stable Hydrothorax - stable from 2 recent xrays, don't suspect that this is causing the acute dyspnea that patient is reporting.  Follow up with Dr. Lucianne Lei trigt after discharge.  6. Hyponatremia - likely from mild dehydration, Improved with NS.  7. Chest pain - Pt says that symptoms are improving, likely secondary to pneumonia and lung cancer, CT chest negative for PE and troponin 0.00. EKG no acute findings.     DVT Prophylaxis: lovenox, TED hoses Code Status:  DNR/DNI Family Communication: phone  Disposition Plan: TBD   Subjective: Pt still on bipap but able to communicate   Objective: Vitals:   09/27/16 0350 09/27/16 0800 09/27/16 0834 09/27/16 0841  BP: 120/88 (!) 115/93 (!) 115/93   Pulse: 99 96 (!) 104   Resp: (!) 21 20 (!) 22   Temp: 98.7 F (37.1 C)  97 F (36.1 C)   TempSrc: Axillary  Axillary   SpO2: 97% 95% 94% 95%  Weight:      Height:        Intake/Output Summary (Last 24 hours) at 09/27/16 0852 Last data filed at 09/27/16 0600  Gross per 24 hour  Intake             1250 ml  Output              600 ml  Net              650 ml   Filed Weights   09/13/2016 1606 09/24/2016 2222  Weight: 61.2 kg (134 lb 14.7 oz) 60.3 kg (133 lb)    Exam:  General exam: cachectic male, appears chronically ill, on bipap, awake, alert. Cooperative.  Respiratory system: tachypneic.  Cardiovascular system: S1 & S2 heard, tachycardic. No JVD, murmurs, gallops, clicks or pedal edema. Gastrointestinal system: Abdomen is nondistended, soft and nontender. Normal bowel sounds heard. Central nervous system: Alert and oriented. No focal neurological deficits. Extremities: no cyanosis.  Data Reviewed: Basic Metabolic Panel:  Recent Labs Lab 09/10/2016 1357 09/08/2016 2247 09/26/16 0413  09/27/16 0311  NA 132*  --  134* 134*  K 4.6  --  4.7 4.6  CL 90*  --  93* 90*  CO2  --   --  33* 36*  GLUCOSE 104*  --  101* 106*  BUN 19  --  14 13  CREATININE 0.70 0.79 0.76 0.95  CALCIUM  --   --  9.0 8.9   Liver Function Tests:  Recent Labs Lab 09/26/16 0413  AST 29  ALT 20  ALKPHOS 85  BILITOT 0.4  PROT 6.1*  ALBUMIN 2.4*   No results for input(s): LIPASE, AMYLASE in the last 168 hours. No results for input(s): AMMONIA in the last 168 hours. CBC:  Recent Labs Lab 09/04/2016 1335 09/09/2016 1357 09/26/16 0413 09/27/16 0311  WBC 14.3*  --  14.3* 11.1*  NEUTROABS 11.9*  --   --   --   HGB 14.1 16.0 11.7* 11.7*  HCT 43.2 47.0 36.1* 37.0*    MCV 93.7  --  93.5 95.6  PLT 342  --  340 303   Cardiac Enzymes:  Recent Labs Lab 09/29/2016 2247 09/26/16 0413 09/26/16 1125  TROPONINI 0.03* 0.03* <0.03   CBG (last 3)   Recent Labs  09/26/16 1124  GLUCAP 105*   Recent Results (from the past 240 hour(s))  Culture, blood (Routine X 2) w Reflex to ID Panel     Status: None (Preliminary result)   Collection Time: 09/22/2016  4:50 PM  Result Value Ref Range Status   Specimen Description BLOOD SOURCE UNKNOWN  Final   Special Requests   Final    BOTTLES DRAWN AEROBIC AND ANAEROBIC Blood Culture adequate volume   Culture NO GROWTH < 24 HOURS  Final   Report Status PENDING  Incomplete  Culture, blood (Routine X 2) w Reflex to ID Panel     Status: None (Preliminary result)   Collection Time: 09/06/2016  5:04 PM  Result Value Ref Range Status   Specimen Description BLOOD RIGHT ANTECUBITAL  Final   Special Requests   Final    BOTTLES DRAWN AEROBIC AND ANAEROBIC Blood Culture adequate volume   Culture NO GROWTH < 24 HOURS  Final   Report Status PENDING  Incomplete  MRSA PCR Screening     Status: None   Collection Time: 09/03/2016 10:30 PM  Result Value Ref Range Status   MRSA by PCR NEGATIVE NEGATIVE Final    Comment:        The GeneXpert MRSA Assay (FDA approved for NASAL specimens only), is one component of a comprehensive MRSA colonization surveillance program. It is not intended to diagnose MRSA infection nor to guide or monitor treatment for MRSA infections.      Studies: Ct Head Wo Contrast  Result Date: 09/28/2016 CLINICAL DATA:  Unresponsive patient EXAM: CT HEAD WITHOUT CONTRAST TECHNIQUE: Contiguous axial images were obtained from the base of the skull through the vertex without intravenous contrast. COMPARISON:  MRI  01/30/2016 FINDINGS: Brain: No evidence of acute infarction, hemorrhage, hydrocephalus, extra-axial collection or mass lesion/mass effect. Few periventricular white matter hypodensities consistent with  small vessel change. Vascular: No hyperdense vessels. Scattered calcifications at the carotid siphons. Skull: No fracture or suspicious bone lesion. Sinuses/Orbits: No acute finding. Other: None IMPRESSION: No CT evidence for acute intracranial abnormality. Electronically Signed   By: Donavan Foil M.D.   On: 09/19/2016 20:13   Ct Angio Chest Pe W Or Wo Contrast  Result Date: 09/02/2016 CLINICAL DATA:  Shortness of breath, history  of lung carcinoma and known hydropneumothorax EXAM: CT ANGIOGRAPHY CHEST WITH CONTRAST TECHNIQUE: Multidetector CT imaging of the chest was performed using the standard protocol during bolus administration of intravenous contrast. Multiplanar CT image reconstructions and MIPs were obtained to evaluate the vascular anatomy. CONTRAST:  100 mL Isovue 370. COMPARISON:  09/08/2016, 07/21/2016 FINDINGS: Cardiovascular: The thoracic aorta shows mild ascending dilatation at 3.9 cm. The pulmonary artery shows a normal branching pattern on the left without filling defect to suggest pulmonary embolism. On the right, no significant cardiac enlargement is noted. Right chest wall port is seen. The visualized branches show no filling defects. Mediastinum/Nodes: Thoracic inlet is within normal limits. New 13 mm short axis lymph node is noted in the AP window. Esophagus is within normal limits. Lungs/Pleura: Large right hydropneumothorax is again identified and stable in appearance. Persisting consolidation in the right lower lobe is stable from the previous exam. The lungs however now demonstrate diffuse bilateral infiltrative changes and interstitial thickening. These changes may represent interstitial and lymphangitis spread of neoplasm although there abrupt onset suggests a more infectious process. This would correspond with the patient's elevated white blood cell count. Small left pleural effusion is noted. The previously seen pleural track on the right is no longer identified. Upper Abdomen:  Visualized upper abdomen is incompletely evaluated. No acute abnormality is seen. \ Musculoskeletal: No definitive bony metastatic disease is seen. Review of the MIP images confirms the above findings. IMPRESSION: Stable right hydropneumothorax and chronic changes in the right lung similar to that seen on the recent CT examination. No evidence of pulmonary emboli. Acute infiltrative changes noted throughout both lungs worse on the left than the right when compare with the prior exam from February 2018. This suggests some acute on chronic pneumonia with associated small left pleural effusion. Followup following appropriate therapy is recommended. New AP window lymph node likely reactive in nature. Mildly prominent ascending aorta but relatively stable from the previous exam. Electronically Signed   By: Inez Catalina M.D.   On: 09/24/2016 15:57   Dg Chest Port 1 View  Result Date: 09/17/2016 CLINICAL DATA:  Shortness of breath EXAM: PORTABLE CHEST 1 VIEW COMPARISON:  09/24/2016, chest radiograph 09/08/2016 FINDINGS: Right-sided central venous port tip overlies the proximal right atrium. Worsening interstitial and alveolar opacity in the left upper and lower lung with probable small left effusion. Slight decreased right-sided hydropneumothorax. Stable cardiomediastinal silhouette. IMPRESSION: 1. Stable to slight decreased size of right hydropneumothorax compared with prior radiograph 09/08/2016 2. Diffuse interstitial and alveolar pulmonary opacity on the left, worse compared to radiograph 09/08/2016, grossly similar compared to CT chest 09/10/2016 Electronically Signed   By: Donavan Foil M.D.   On: 09/18/2016 16:19   Scheduled Meds: . enoxaparin (LOVENOX) injection  40 mg Subcutaneous Q24H  . feeding supplement (ENSURE ENLIVE)  237 mL Oral TID BM  . folic acid  1 mg Oral Daily  . ipratropium-albuterol  3 mL Nebulization Q6H  . mouth rinse  15 mL Mouth Rinse BID  . metoprolol tartrate  25 mg Oral BID  .  multivitamin with minerals  1 tablet Oral Daily  . sodium chloride flush  3 mL Intravenous Q12H   Continuous Infusions: . sodium chloride    . sodium chloride 50 mL/hr at 09/26/16 1429  . ceFEPime (MAXIPIME) IV Stopped (09/27/16 0531)  . vancomycin Stopped (09/27/16 0205)    Principal Problem:   Healthcare-associated pneumonia Active Problems:   Stable Hydropneumothorax   Acute respiratory failure with hypoxia (HCC)  Adenocarcinoma of right lung, stage 4 (HCC)   Antineoplastic chemotherapy induced anemia   Protein-calorie malnutrition, severe   Leukocytosis   Palliative care by specialist  Time spent:   Irwin Brakeman, MD, FAAFP [facility] Pager [phone]  If 7PM-7AM, please contact night-coverage [email or web address] Password [email or web address] 09/27/2016, 8:52 AM    LOS: 2 days

## 2016-09-27 NOTE — Progress Notes (Addendum)
Pt continues to attempt to adjust Bipap mask due to it "being too tight" per patient. Pt educated on purpose of mask and fitting. Foam padding applied to bridge of nose for patient comfort. Discussed with RT plan of weaning pt off Bipap. Plan is for HFNC. Will continue to monitor pt closely.

## 2016-09-28 MED ORDER — MORPHINE BOLUS VIA INFUSION
1.0000 mg | INTRAVENOUS | Status: DC | PRN
  Administered 2016-09-29 (×2): 2 mg via INTRAVENOUS
  Filled 2016-09-28: qty 2

## 2016-09-28 MED ORDER — MORPHINE SULFATE 25 MG/ML IV SOLN
2.0000 mg/h | INTRAVENOUS | Status: DC
  Administered 2016-09-28: 0.5 mg/h via INTRAVENOUS
  Filled 2016-09-28: qty 10

## 2016-09-28 NOTE — Progress Notes (Signed)
PT Cancellation Note/ Discharge note  Patient Details Name: Roger Barnes MRN: [id number] DOB: 06/17/1960   Cancelled Treatment:    Reason Eval/Treat Not Completed: Other (comment). Pt on morphine drip and transitioning to comfort care/residential hospice. Pt with no further acute PT needs at this time. PT SIGNING OFF. Please re-consult if needed in future. Thank you.   Arie Powell M [facility] [place] 09/28/2016, 2:18 PM   Kittie Plater, PT, DPT Pager #: [phone] Office #: [phone]

## 2016-09-28 NOTE — Progress Notes (Signed)
Daily Progress Note   Patient Name: Roger Barnes       Date: 09/28/2016 DOB: May 06, 1961  Age: 56 y.o. MRN#: [id number] Attending Physician: Roger Iba, MD Primary Care Physician: Roger Reamer, MD Admit Date: 09/11/2016  Reason for Consultation/Follow-up: Establishing goals of care, Non pain symptom management, Pain control and Psychosocial/spiritual support  Subjective: Patient now able to stay off of BiPAP since starting scheduled morphine. He has been able to converse with family and staff to a limited degree, as well as eat and drink small amounts of food and liquid. He is on high flow nasal cannula and tolerating morphine with some sedation. He is subjectively reporting that his breathing is better as well as his chest wall pain. Anxiety improved with scheduled Xanax  Length of Stay: 3  Current Medications: Scheduled Meds:  . ALPRAZolam  0.25 mg Oral TID  . bisacodyl  5 mg Oral Daily  . enoxaparin (LOVENOX) injection  40 mg Subcutaneous Q24H  . feeding supplement (ENSURE ENLIVE)  237 mL Oral TID BM  . ipratropium-albuterol  3 mL Nebulization Q6H  . mouth rinse  15 mL Mouth Rinse BID  . metoprolol tartrate  25 mg Oral BID  . sodium chloride flush  3 mL Intravenous Q12H    Continuous Infusions: . sodium chloride    . ceFEPime (MAXIPIME) IV 1 g (09/28/16 3744)  . morphine 0.5 mg/hr (09/28/16 1100)    PRN Meds: sodium chloride, acetaminophen **OR** acetaminophen, ALPRAZolam, LORazepam, morphine, ondansetron **OR** ondansetron (ZOFRAN) IV, polyethylene glycol, prochlorperazine, sodium chloride flush, zolpidem  Physical Exam  Constitutional: He is oriented to person, place, and time.  Cachectic, older man Appears ill  HENT:  Temporal wasting  Cardiovascular:    Tachycardic  Pulmonary/Chest:  Increased work of breathing at rest but patient can converse for brief periods of time and eat small amount of food and liquid. Has been off of BiPAP, on high flow nasal cannula  Musculoskeletal: Normal range of motion.  Neurological: He is alert and oriented to person, place, and time.  Skin: Skin is warm and dry.  Psychiatric: He has a normal mood and affect. His behavior is normal. Judgment and thought content normal.  Nursing note and vitals reviewed.           Vital Signs: BP 114/87 (BP  Location: Left Arm)   Pulse (!) 102   Temp 97.6 F (36.4 C) (Oral)   Resp 15   Ht '5\' 10"'$  (1.778 m)   Wt 60.3 kg (133 lb)   SpO2 96%   BMI 19.08 kg/m  SpO2: SpO2: 96 % O2 Device: O2 Device: High Flow Nasal Cannula O2 Flow Rate: O2 Flow Rate (L/min): 9 L/min  Intake/output summary:  Intake/Output Summary (Last 24 hours) at 09/28/16 1208 Last data filed at 09/28/16 0800  Gross per 24 hour  Intake              390 ml  Output              575 ml  Net             -185 ml   LBM: Last BM Date: 09/26/16 Baseline Weight: Weight: 61.2 kg (134 lb 14.7 oz) Most recent weight: Weight: 60.3 kg (133 lb)       Palliative Assessment/Data:    Flowsheet Rows     Most Recent Value  Intake Tab  Referral Department  Hospitalist  Unit at Time of Referral  Intermediate Care Unit  Palliative Care Primary Diagnosis  Cancer  Date Notified  09/26/16  Palliative Care Type  New Palliative care  Reason for referral  Clarify Goals of Care, Non-pain Symptom, Psychosocial or Spiritual support  Date of Admission  09/24/2016  Date first seen by Palliative Care  09/26/16  # of days Palliative referral response time  0 Day(s)  # of days IP prior to Palliative referral  1  Clinical Assessment  Palliative Performance Scale Score  30%  Pain Max last 24 hours  Not able to report  Pain Min Last 24 hours  Not able to report  Dyspnea Max Last 24 Hours  Not able to report  Dyspnea Min  Last 24 hours  Not able to report  Nausea Max Last 24 Hours  Not able to report  Nausea Min Last 24 Hours  Not able to report  Anxiety Max Last 24 Hours  Not able to report  Anxiety Min Last 24 Hours  Not able to report  Other Max Last 24 Hours  Not able to report  Psychosocial & Spiritual Assessment  Palliative Care Outcomes  Patient/Family wishes: Interventions discontinued/not started   Mechanical Ventilation  Palliative Care follow-up planned  No      Patient Active Problem List   Diagnosis Date Noted  . Palliative care by specialist   . Healthcare-associated pneumonia 09/28/2016  . Leukocytosis 09/14/2016  . Pressure injury of skin 09/09/2016  . Protein-calorie malnutrition, severe 09/09/2016  . Tachycardia 09/08/2016  . Goals of care, counseling/discussion 07/23/2016  . Antineoplastic chemotherapy induced anemia 05/07/2016  . Stuffy and runny nose 05/07/2016  . Encounter for antineoplastic chemotherapy 02/26/2016  . Adenocarcinoma of right lung, stage 4 ([place])   . Acute respiratory failure with hypoxia ([place])   . Atelectasis   . Stable Hydropneumothorax 01/25/2016  . Elevated d-dimer 01/25/2016  . Abnormal EKG 01/25/2016  . Pleural effusion, right   . 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 He was recently discharged from [facility] on 4/10 for shortness of breath. Patient is followed by oncologist, Dr. Earlie Barnes,. In the past he has had a large right pleural  effusion that was required thoracentesis. He also has a history of a Pleurx catheter being placed which was removed because it was not draining. CT surgery felt that his hydrothorax was stable and no intervention was needed at this time; repeat CT scan was suspicious for bilateral pneumonia.    Assessment: Patiently subjectively verbalizing less shortness of breath and has now been able to stay off of BiPAP for 24 hours. Tolerating scheduled morphine with some sedation. We discussed a continuous infusion as a means to deliver a more steady state of medication and perhaps lessening sedation. We will base this continuous infusion on the previous 24 hour total daily dosage of morphine which is 12 mg. Patient received no PRN's overnight.  Patient is still wanting to do everything that he can do to "make me better", although he does appear to recognize that he is no longer in a position to go home and care for his parents as he once did. He tells me "everything is now reversed"; which I interpreted to mean that he himself is now requiring care.  Recommendations/Plan:  We'll discontinue scheduled morphine and convert to a morphine continuous infusion at 0.5 mg IV and 1-2 mg every 2 hours as needed for pain or shortness of breath  Continue scheduled Xanax. As needed Ativan as well as Xanax is available for breakthrough symptoms  Palliative medicine team to stay involved for symptom management as well as subsequent discharge planning such as residential hospice. Residential hospice has not been discussed with patient as he is just now getting to the point where he is off of BiPAP and can hold a conversation. Our team will assist him with this in the days going forward  Goals of Care and Additional Recommendations:  Limitations on Scope of Treatment: Minimize Medications, Initiate Comfort Feeding, No Artificial Feeding, No Hemodialysis, No Surgical Procedures and No Tracheostomy  Code Status:    Code Status Orders        Start     Ordered   09/26/16 0724  Do not attempt resuscitation (DNR)  Continuous    Question Answer Comment  In the event of cardiac or respiratory ARREST Do not call a "code blue"   In the event of cardiac or respiratory ARREST Do not perform Intubation, CPR,  defibrillation or ACLS   In the event of cardiac or respiratory ARREST Use medication by any route, position, wound care, and other measures to relive pain and suffering. May use oxygen, suction and manual treatment of airway obstruction as needed for comfort.      09/26/16 0723    Code Status History    Date Active Date Inactive Code Status Order ID Comments User Context   09/15/2016 10:28 PM 09/26/2016  7:23 AM Full Code [id number]  Roger Iba, MD Inpatient   09/22/2016 10:28 PM 09/03/2016 10:28 PM Full Code [id number]  Roger Iba, MD Inpatient   09/11/2016  7:09 PM 09/28/2016 10:28 PM DNR [id number]  Roger Iba, MD ED   09/08/2016  6:37 PM 09/09/2016  7:52 PM Full Code [id number]  Geradine Girt, DO Inpatient   01/25/2016 11:29 AM 02/05/2016  9:36 PM Full Code [id number]  Willia Craze, NP ED       Prognosis:   < 4 weeks in the setting of stage IV lung cancer with recurrent hydrothorax, pleural effusions. Patient is at high risk for an acute cardiopulmonary/respiratory failure event  Discharge Planning:  To Be Determined  Care plan was discussed with  Dr. Hilma Favors  Thank you for allowing the Palliative Medicine Team to assist in the care of this patient.   Time In: 0900 Time Out: 0940 Total Time 40 min Prolonged Time Billed  no       Greater than 50%  of this time was spent counseling and coordinating care related to the above assessment and plan.  Dory Horn, NP  Please contact Palliative Medicine Team phone at [phone] for questions and concerns.

## 2016-09-28 NOTE — Progress Notes (Signed)
PROGRESS NOTE    Roger Barnes  WER:[id number]  DOB: 06/27/1960  DOA: 09/27/2016 PCP: Maren Reamer, MD Outpatient Specialists:  Hospital course: 56 y.o. 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   Assessment & Plan:   1. Acute on chronic respiratory failure from stage 4 lung cancer with superimposed HCAP - likely contracted infection from recent hospitalization - would continue broad spectrum antibiotic coverage with cefepime for now.  WBC trending down.  He had been placed on bipap for respiratory support and now on HFNC with some improvement in mentation and mental status, confirmed again with him DNR/DNI status but would like to continue to treat infection and follow clinically.  follow blood and sputum cultures no growth to date.  DC vancomycin MRSA pcr negative. Continue Zosyn.   2. Stage 4 lung cancer - Pt seems to have had a significant decline in past 2 weeks after speaking with mother by phone, I asked for palliative medicine team to see him and they have started him on oral morphine to help with the shortness of breath, pain and discomfort.  He says that he is a lot more comfortable now and less respiratory distress.  I think he would greatly benefit from residential hospice.  3. Protein calorie malnutrition - severe - secondary to stage 4 lung cancer, consulted dietitian. Comfort feeding.  4. Leukocytosis - secondary to pneumonia - follow CBC.  WBC trending down.  5. Stable Hydrothorax - stable from 2 recent xrays, don't suspect that this is causing the acute dyspnea that patient is reporting.  Follow up with Dr. Lucianne Lei trigt after discharge.  6. Hyponatremia - likely from mild dehydration, Improved with NS.  7. Chest pain - Pt says that symptoms are improving, likely secondary to pneumonia and lung cancer, CT chest negative for PE and troponin  0.00. EKG no acute findings.     DVT Prophylaxis: lovenox, TED hoses Code Status: DNR/DNI Family Communication: phone  Disposition Plan: Residential hospice   Subjective: Pt says the morphine is helping greatly improve his respiratory distress and he says he feels like he is breathing better.   Objective: Vitals:   09/28/16 0000 09/28/16 0100 09/28/16 0410 09/28/16 0800  BP: 113/74  109/79 115/77  Pulse: (!) 107  (!) 110 100  Resp: (!) 29  (!) 43 (!) 32  Temp: 97.7 F (36.5 C)  97.9 F (36.6 C) 97.9 F (36.6 C)  TempSrc: Oral  Oral Oral  SpO2: 99% 100% 91% 100%  Weight:      Height:        Intake/Output Summary (Last 24 hours) at 09/28/16 0814 Last data filed at 09/28/16 0700  Gross per 24 hour  Intake              310 ml  Output              475 ml  Net             -165 ml   Filed Weights   09/12/2016 1606 09/22/2016 2222  Weight: 61.2 kg (134 lb 14.7 oz) 60.3 kg (133 lb)    Exam:  General exam: cachectic male, appears chronically ill, awake, alert. Cooperative.  Respiratory system: tachypneic.  Cardiovascular system: S1 & S2 heard, tachycardic. No JVD, murmurs, gallops, clicks or pedal edema. Gastrointestinal system: Abdomen is nondistended, soft and nontender. Normal bowel sounds  heard. Central nervous system: Alert and oriented. No focal neurological deficits. Extremities: no cyanosis.  Data Reviewed: Basic Metabolic Panel:  Recent Labs Lab 09/13/2016 1357 09/22/2016 2247 09/26/16 0413 09/27/16 0311  NA 132*  --  134* 134*  K 4.6  --  4.7 4.6  CL 90*  --  93* 90*  CO2  --   --  33* 36*  GLUCOSE 104*  --  101* 106*  BUN 19  --  14 13  CREATININE 0.70 0.79 0.76 0.95  CALCIUM  --   --  9.0 8.9   Liver Function Tests:  Recent Labs Lab 09/26/16 0413  AST 29  ALT 20  ALKPHOS 85  BILITOT 0.4  PROT 6.1*  ALBUMIN 2.4*   No results for input(s): LIPASE, AMYLASE in the last 168 hours. No results for input(s): AMMONIA in the last 168  hours. CBC:  Recent Labs Lab 09/16/2016 1335 09/01/2016 1357 09/26/16 0413 09/27/16 0311  WBC 14.3*  --  14.3* 11.1*  NEUTROABS 11.9*  --   --   --   HGB 14.1 16.0 11.7* 11.7*  HCT 43.2 47.0 36.1* 37.0*  MCV 93.7  --  93.5 95.6  PLT 342  --  340 303   Cardiac Enzymes:  Recent Labs Lab 09/11/2016 2247 09/26/16 0413 09/26/16 1125  TROPONINI 0.03* 0.03* <0.03   CBG (last 3)   Recent Labs  09/26/16 1124  GLUCAP 105*   Recent Results (from the past 240 hour(s))  Culture, blood (Routine X 2) w Reflex to ID Panel     Status: None (Preliminary result)   Collection Time: 09/19/2016  4:50 PM  Result Value Ref Range Status   Specimen Description BLOOD SOURCE UNKNOWN  Final   Special Requests   Final    BOTTLES DRAWN AEROBIC AND ANAEROBIC Blood Culture adequate volume   Culture NO GROWTH 2 DAYS  Final   Report Status PENDING  Incomplete  Culture, blood (Routine X 2) w Reflex to ID Panel     Status: None (Preliminary result)   Collection Time: 09/23/2016  5:04 PM  Result Value Ref Range Status   Specimen Description BLOOD RIGHT ANTECUBITAL  Final   Special Requests   Final    BOTTLES DRAWN AEROBIC AND ANAEROBIC Blood Culture adequate volume   Culture NO GROWTH 3 DAYS  Final   Report Status PENDING  Incomplete  MRSA PCR Screening     Status: None   Collection Time: 09/26/2016 10:30 PM  Result Value Ref Range Status   MRSA by PCR NEGATIVE NEGATIVE Final    Comment:        The GeneXpert MRSA Assay (FDA approved for NASAL specimens only), is one component of a comprehensive MRSA colonization surveillance program. It is not intended to diagnose MRSA infection nor to guide or monitor treatment for MRSA infections.     Studies: No results found. Scheduled Meds: . ALPRAZolam  0.25 mg Oral TID  . bisacodyl  5 mg Oral Daily  . enoxaparin (LOVENOX) injection  40 mg Subcutaneous Q24H  . feeding supplement (ENSURE ENLIVE)  237 mL Oral TID BM  . ipratropium-albuterol  3 mL  Nebulization Q6H  . mouth rinse  15 mL Mouth Rinse BID  . metoprolol tartrate  25 mg Oral BID  .  morphine injection  2 mg Intravenous Q4H  . sodium chloride flush  3 mL Intravenous Q12H   Continuous Infusions: . sodium chloride    . ceFEPime (MAXIPIME) IV 1 g (09/28/16  0638)    Principal Problem:   Healthcare-associated pneumonia Active Problems:   Stable Hydropneumothorax   Acute respiratory failure with hypoxia (HCC)   Adenocarcinoma of right lung, stage 4 (HCC)   Antineoplastic chemotherapy induced anemia   Protein-calorie malnutrition, severe   Leukocytosis   Palliative care by specialist  Time spent:   Irwin Brakeman, MD, FAAFP [facility] Pager [phone]  If 7PM-7AM, please contact night-coverage [email or web address] Password [email or web address] 09/28/2016, 8:14 AM    LOS: 3 days

## 2016-09-29 ENCOUNTER — Encounter (HOSPITAL_COMMUNITY): Payer: Self-pay | Admitting: *Deleted

## 2016-09-29 DIAGNOSIS — Z7189 Other specified counseling: Secondary | ICD-10-CM

## 2016-09-29 NOTE — Progress Notes (Signed)
Daily Progress Note   Patient Name: Roger Barnes       Date: 09/29/2016 DOB: 25-May-1961  Age: 56 y.o. MRN#: [id number] Attending Physician: Murlean Iba, MD Primary Care Physician: Maren Reamer, MD Admit Date: 09/24/2016  Reason for Consultation/Follow-up: Establishing goals of care, Non pain symptom management, Pain control and Psychosocial/spiritual support  Subjective: I saw and examined Roger Barnes this afternoon.    He was largely somnolent and not able to participate in conversation regarding goals moving forward due to his mental status.  He has received medication for dyspnea and anxiety, and while these may be contributing to his sleepiness, I am concerned that he is progressing and may never regain ability to make his own decisions.  I met with his mother and father.  They report his physicians have been doing a good job explaining things to them.  We discussed his continued decline.  His mother asked about his previously scheduled chemotherapy, and we talked about my concern that he would not benefit from further disease modifying therapy.  His mother states that she feels like he is progressing quickly, and she believes he will not survive this hospitalization.  We discussed what his wishes would be if he were to understand his situation. Both his mother and father agree that his #1 priority would be to be comfortable, out of pain, not anxious, and not short of breath.  We discussed options of 1) continue with current therapy; 2) full comfort care; and 3) continuing with gentle medical therapies (current antibiotics) while also placing limits on escalation of care (no further bipap as he hated it) and prioritizing his comfort through aggressive symptom management even if  this causes undesired side effects such as increased somnolence or respiratory depression.  Length of Stay: 4  Current Medications: Scheduled Meds:  . ALPRAZolam  0.25 mg Oral TID  . bisacodyl  5 mg Oral Daily  . enoxaparin (LOVENOX) injection  40 mg Subcutaneous Q24H  . feeding supplement (ENSURE ENLIVE)  237 mL Oral TID BM  . ipratropium-albuterol  3 mL Nebulization Q6H  . mouth rinse  15 mL Mouth Rinse BID  . metoprolol tartrate  25 mg Oral BID  . sodium chloride flush  3 mL Intravenous Q12H    Continuous Infusions: . sodium chloride    .  ceFEPime (MAXIPIME) IV Stopped (09/29/16 1505)  . morphine 1 mg/hr (09/29/16 1700)    PRN Meds: sodium chloride, acetaminophen **OR** acetaminophen, ALPRAZolam, LORazepam, morphine, ondansetron **OR** ondansetron (ZOFRAN) IV, polyethylene glycol, prochlorperazine, sodium chloride flush, zolpidem  Physical Exam  Constitutional:  Cachectic, older man Appears ill  HENT:  Temporal wasting  Cardiovascular:  Tachycardic  Pulmonary/Chest:  Mild increased WOB  Skin: Skin is warm and dry.  Psychiatric:  Unable to assess  Nursing note and vitals reviewed.           Vital Signs: BP (!) 133/93 (BP Location: Right Wrist)   Pulse (!) 109   Temp 97.5 F (36.4 C) (Oral)   Resp 15   Ht 5' 10"  (1.778 m)   Wt 60.3 kg (133 lb)   SpO2 100%   BMI 19.08 kg/m  SpO2: SpO2: 100 % O2 Device: O2 Device: High Flow Nasal Cannula O2 Flow Rate: O2 Flow Rate (L/min): 9 L/min  Intake/output summary:   Intake/Output Summary (Last 24 hours) at 09/29/16 1725 Last data filed at 09/29/16 1435  Gross per 24 hour  Intake            600.5 ml  Output              350 ml  Net            250.5 ml   LBM: Last BM Date: 09/27/16 Baseline Weight: Weight: 61.2 kg (134 lb 14.7 oz) Most recent weight: Weight: 60.3 kg (133 lb)       Palliative Assessment/Data:    Flowsheet Rows     Most Recent Value  Intake Tab  Referral Department  Hospitalist  Unit at  Time of Referral  Intermediate Care Unit  Palliative Care Primary Diagnosis  Cancer  Date Notified  09/26/16  Palliative Care Type  New Palliative care  Reason for referral  Clarify Goals of Care, Non-pain Symptom, Psychosocial or Spiritual support  Date of Admission  09/05/2016  Date first seen by Palliative Care  09/26/16  # of days Palliative referral response time  0 Day(s)  # of days IP prior to Palliative referral  1  Clinical Assessment  Palliative Performance Scale Score  30%  Pain Max last 24 hours  Not able to report  Pain Min Last 24 hours  Not able to report  Dyspnea Max Last 24 Hours  Not able to report  Dyspnea Min Last 24 hours  Not able to report  Nausea Max Last 24 Hours  Not able to report  Nausea Min Last 24 Hours  Not able to report  Anxiety Max Last 24 Hours  Not able to report  Anxiety Min Last 24 Hours  Not able to report  Other Max Last 24 Hours  Not able to report  Psychosocial & Spiritual Assessment  Palliative Care Outcomes  Patient/Family wishes: Interventions discontinued/not started   Mechanical Ventilation  Palliative Care follow-up planned  No      Patient Active Problem List   Diagnosis Date Noted  . Palliative care by specialist   . Healthcare-associated pneumonia 09/24/2016  . Leukocytosis 09/19/2016  . Pressure injury of skin 09/09/2016  . Protein-calorie malnutrition, severe 09/09/2016  . Tachycardia 09/08/2016  . Goals of care, counseling/discussion 07/23/2016  . Antineoplastic chemotherapy induced anemia 05/07/2016  . Stuffy and runny nose 05/07/2016  . Encounter for antineoplastic chemotherapy 02/26/2016  . Adenocarcinoma of right lung, stage 4 ([place])   . Acute respiratory failure with hypoxia ([place])   .  Atelectasis   . Stable Hydropneumothorax 01/25/2016  . Elevated d-dimer 01/25/2016  . Abnormal EKG 01/25/2016  . Pleural effusion, right   . 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 He was recently discharged from [facility] on 4/10 for shortness of breath. Patient is followed by oncologist, Dr. Earlie Server,. In the past he has had a large right pleural effusion that was required thoracentesis. He also has a history of a Pleurx catheter being placed which was removed because it was not draining. CT surgery felt that his hydrothorax was stable and no intervention was needed at this time; repeat CT scan was suspicious for bilateral pneumonia.   Recommendations/Plan:  He is now requiring multiple doses of breakthrough medication to maintain his comfort. I discussed with his parents will plan for increase in a morphine continuous infusion to 6m IV and 1-2 mg every 2 hours as needed for pain or shortness of breath  Continue scheduled Xanax. As needed Ativan as well as Xanax is available for breakthrough symptoms or of he is unable to take PO mediction  Continue with gentle medical therapies (current antibiotics) with no escalation of care (no further bipap) and priority on his comfort through aggressive symptom management.  Medications for comfort should not be held even if this causes undesired side effects such as increased somnolence or respiratory depression.  Palliative medicine team to stay involved for symptom management as well as subsequent discharge planning (Likely residential hospice if he declines further or once he has completed antibiotic course).  Residential hospice has not been discussed with patient as he was not able to participate in conversation.  I did begin this conversation with his parents today.   Goals of Care and Additional Recommendations:  Limitations on Scope of Treatment: Minimize Medications, Initiate Comfort Feeding, No Artificial Feeding, No Hemodialysis, No  Surgical Procedures and No Tracheostomy  Code Status:    Code Status Orders        Start     Ordered   09/26/16 0724  Do not attempt resuscitation (DNR)  Continuous    Question Answer Comment  In the event of cardiac or respiratory ARREST Do not call a "code blue"   In the event of cardiac or respiratory ARREST Do not perform Intubation, CPR, defibrillation or ACLS   In the event of cardiac or respiratory ARREST Use medication by any route, position, wound care, and other measures to relive pain and suffering. May use oxygen, suction and manual treatment of airway obstruction as needed for comfort.      09/26/16 0723    Code Status History    Date Active Date Inactive Code Status Order ID Comments User Context   09/23/2016 10:28 PM 09/26/2016  7:23 AM Full Code [id number] CMurlean Iba MD Inpatient   09/28/2016 10:28 PM 09/19/2016 10:28 PM Full Code [id number] CMurlean Iba MD Inpatient   09/16/2016  7:09 PM 09/14/2016 10:28 PM DNR [id number] CMurlean Iba MD ED   09/08/2016  6:37 PM 09/09/2016  7:52 PM Full Code [id number] JGeradine Girt DO Inpatient   01/25/2016 11:29 AM 02/05/2016  9:36 PM Full Code [id number] PWillia Craze NP ED       Prognosis:  < 2 weeks in the setting of stage IV  lung cancer with recurrent hydrothorax, pleural effusions. His symptoms seem to be worsening and I think that he is beginning to have acute worsening of his respiratory failure.  Discharge Planning:  To Be Determined  Care plan was discussed with Dr. Hilma Favors  Thank you for allowing the Palliative Medicine Team to assist in the care of this patient.   Total Time 40 min Prolonged Time Billed  no       Greater than 50%  of this time was spent counseling and coordinating care related to the above assessment and plan.  Micheline Rough, MD  Please contact Palliative Medicine Team phone at [phone] for questions and concerns.

## 2016-09-29 NOTE — Progress Notes (Signed)
Palliative Medicine RN Note: AM symptom check. RR around 38. He is able to say only a few words per breath. He reports he feels better on [place] (off bipap). However, he also reports he is still uncomfortable. Roger Barnes is in place, and he reports relief from that. No boluses given per [facility]; he is on morphine 0.5 mg/hour continuous infusion. Requested RN give bolus dose; I will follow up around lunch time.  Marjie Skiff Annaleah Arata, RN, BSN, [facility] 09/29/2016 10:40 AM Cell [phone] 8:00-4:00 Monday-Friday Office [phone]

## 2016-09-29 NOTE — Progress Notes (Signed)
PROGRESS NOTE   Roger Barnes  WCB:[id number]  DOB: June 01, 1961  DOA: 09/06/2016 PCP: Maren Reamer, MD Outpatient Specialists:  Hospital course: 56 y.o. 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   Assessment & Plan:   1. Acute on chronic respiratory failure from stage 4 lung cancer with superimposed HCAP - likely contracted infection from recent hospitalization - continue cefepime for now.  WBC trending down.  He had initially been placed on bipap for respiratory support and now on HFNC with some improvement in mentation and mental status, confirmed again with him DNR/DNI status but would like to continue to treat infection and follow clinically.  follow blood and sputum cultures no growth to date.  DC vancomycin MRSA pcr negative. .   2. Stage 4 lung cancer - Pt seems to have had a significant decline in past 2 weeks after speaking with mother by phone, I asked for palliative medicine team to see him and they have started him on oral morphine to help with the shortness of breath, pain and discomfort.  He says that he is a lot more comfortable now and less respiratory distress.  I think he would greatly benefit from residential hospice. He is still in denial about the seriousness of his condition.  I feel he may have less than 4 weeks to live.  Appreciate assistance from palliative medicine team.  3. Protein calorie malnutrition - severe - secondary to stage 4 lung cancer, consulted dietitian.  4. Leukocytosis - secondary to pneumonia.  WBC trending down.  5. Stable Hydrothorax - stable from 2 recent xrays, don't suspect that this is causing the acute dyspnea that patient is reporting.  Follow up with Dr. Lucianne Lei trigt after discharge.  6. Hyponatremia - likely from mild dehydration, Improved with NS.  7. Chest pain - Pt says that symptoms are improving,  likely secondary to pneumonia and lung cancer, CT chest negative for PE and troponin 0.00. EKG no acute findings.     DVT Prophylaxis: lovenox, TED hoses Code Status: DNR/DNI Family Communication: phone  Disposition Plan: Residential hospice   Subjective: Pt is tolerating HFNC with much less resp distress but shallow breathing  Objective: Vitals:   09/29/16 0000 09/29/16 0221 09/29/16 0346 09/29/16 0730  BP:   124/81 133/87  Pulse: (!) 103  (!) 113 (!) 115  Resp: (!) 32  (!) 34 (!) 41  Temp:   98.2 F (36.8 C)   TempSrc:   Oral   SpO2: 99% 99% 100% 97%  Weight:      Height:        Intake/Output Summary (Last 24 hours) at 09/29/16 0753 Last data filed at 09/29/16 0351  Gross per 24 hour  Intake              794 ml  Output              650 ml  Net              144 ml   Filed Weights   09/21/2016 1606 09/13/2016 2222  Weight: 61.2 kg (134 lb 14.7 oz) 60.3 kg (133 lb)    Exam:  General exam: cachectic male, appears chronically ill, awake, alert. Cooperative.  Respiratory system: tachypneic.  Cardiovascular system: S1 & S2 heard, tachycardic. No JVD, murmurs, gallops, clicks or pedal edema. Gastrointestinal system: Abdomen is nondistended, soft and  nontender. Normal bowel sounds heard. Central nervous system: Alert and oriented. No focal neurological deficits. Extremities: no cyanosis.  Data Reviewed: Basic Metabolic Panel:  Recent Labs Lab 08/31/2016 1357 08/31/2016 2247 09/26/16 0413 09/27/16 0311  NA 132*  --  134* 134*  K 4.6  --  4.7 4.6  CL 90*  --  93* 90*  CO2  --   --  33* 36*  GLUCOSE 104*  --  101* 106*  BUN 19  --  14 13  CREATININE 0.70 0.79 0.76 0.95  CALCIUM  --   --  9.0 8.9   Liver Function Tests:  Recent Labs Lab 09/26/16 0413  AST 29  ALT 20  ALKPHOS 85  BILITOT 0.4  PROT 6.1*  ALBUMIN 2.4*   No results for input(s): LIPASE, AMYLASE in the last 168 hours. No results for input(s): AMMONIA in the last 168 hours. CBC:  Recent Labs Lab  09/07/2016 1335 09/01/2016 1357 09/26/16 0413 09/27/16 0311  WBC 14.3*  --  14.3* 11.1*  NEUTROABS 11.9*  --   --   --   HGB 14.1 16.0 11.7* 11.7*  HCT 43.2 47.0 36.1* 37.0*  MCV 93.7  --  93.5 95.6  PLT 342  --  340 303   Cardiac Enzymes:  Recent Labs Lab 09/06/2016 2247 09/26/16 0413 09/26/16 1125  TROPONINI 0.03* 0.03* <0.03   CBG (last 3)   Recent Labs  09/26/16 1124  GLUCAP 105*   Recent Results (from the past 240 hour(s))  Culture, blood (Routine X 2) w Reflex to ID Panel     Status: None (Preliminary result)   Collection Time: 09/06/2016  4:50 PM  Result Value Ref Range Status   Specimen Description BLOOD SOURCE UNKNOWN  Final   Special Requests   Final    BOTTLES DRAWN AEROBIC AND ANAEROBIC Blood Culture adequate volume   Culture NO GROWTH 3 DAYS  Final   Report Status PENDING  Incomplete  Culture, blood (Routine X 2) w Reflex to ID Panel     Status: None (Preliminary result)   Collection Time: 09/18/2016  5:04 PM  Result Value Ref Range Status   Specimen Description BLOOD RIGHT ANTECUBITAL  Final   Special Requests   Final    BOTTLES DRAWN AEROBIC AND ANAEROBIC Blood Culture adequate volume   Culture NO GROWTH 3 DAYS  Final   Report Status PENDING  Incomplete  MRSA PCR Screening     Status: None   Collection Time: 09/14/2016 10:30 PM  Result Value Ref Range Status   MRSA by PCR NEGATIVE NEGATIVE Final    Comment:        The GeneXpert MRSA Assay (FDA approved for NASAL specimens only), is one component of a comprehensive MRSA colonization surveillance program. It is not intended to diagnose MRSA infection nor to guide or monitor treatment for MRSA infections.     Studies: No results found. Scheduled Meds: . ALPRAZolam  0.25 mg Oral TID  . bisacodyl  5 mg Oral Daily  . enoxaparin (LOVENOX) injection  40 mg Subcutaneous Q24H  . feeding supplement (ENSURE ENLIVE)  237 mL Oral TID BM  . ipratropium-albuterol  3 mL Nebulization Q6H  . mouth rinse  15 mL  Mouth Rinse BID  . metoprolol tartrate  25 mg Oral BID  . sodium chloride flush  3 mL Intravenous Q12H   Continuous Infusions: . sodium chloride    . ceFEPime (MAXIPIME) IV 1 g (09/28/16 2233)  . morphine 0.5 mg/hr (  09/29/16 0700)    Principal Problem:   Healthcare-associated pneumonia Active Problems:   Stable Hydropneumothorax   Acute respiratory failure with hypoxia (HCC)   Adenocarcinoma of right lung, stage 4 (HCC)   Antineoplastic chemotherapy induced anemia   Protein-calorie malnutrition, severe   Leukocytosis   Palliative care by specialist  Time spent:   Irwin Brakeman, MD, FAAFP [facility] Pager [phone]  If 7PM-7AM, please contact night-coverage [email or web address] Password [email or web address] 09/29/2016, 7:52 AM    LOS: 4 days

## 2016-09-29 NOTE — Progress Notes (Signed)
Respiratory rate- 39/min, complete a sentence, claimed to feel slightly uncomfortable. Morphine 2 mg bolus given with relief after few min. Continue to monitor.

## 2016-09-29 NOTE — Progress Notes (Addendum)
Palliative Medicine RN Note: Came to check on previous dose of bolus morphine. Entered room to find patient scared, begging for help, asking for more oxygen. RR around 40. Pt got bolus dose about an hour ago; RN Chat gave another dose while I was in the room. Left message for Dr Domingo Cocking to notify of back to back boluses with pt still awake and reporting trouble breathing. He seems very anxious as well, and he repeatedly apologized for "bothering" [place]. Requested RN also give him his PRN Ativan IV. Pt does report that he knows he can call for help but that his call bell had fallen off the bed.   Plan f/u again in a few hours.   Marjie Skiff Doloris Servantes, RN, BSN, [facility] 09/29/2016 11:55 AM Cell [phone] 8:00-4:00 Monday-Friday Office [phone]

## 2016-09-29 NOTE — Progress Notes (Signed)
Complaining that it is warm in his room, a little bit anxious, RR_33/min, o2 increased to 9L, given '2mg'$  bolus morphine, ativan  iv given, went to sleep after 30 min, Cont. to monitor.

## 2016-09-30 ENCOUNTER — Telehealth: Payer: Self-pay | Admitting: *Deleted

## 2016-09-30 ENCOUNTER — Encounter (HOSPITAL_COMMUNITY): Payer: Self-pay | Admitting: *Deleted

## 2016-09-30 DIAGNOSIS — Z515 Encounter for palliative care: Secondary | ICD-10-CM

## 2016-09-30 LAB — CULTURE, BLOOD (ROUTINE X 2)
CULTURE: NO GROWTH
Culture: NO GROWTH
Special Requests: ADEQUATE
Special Requests: ADEQUATE

## 2016-09-30 MED ORDER — GLYCOPYRROLATE 0.2 MG/ML IJ SOLN: 0.2000 mg | INTRAMUSCULAR | Status: DC | PRN

## 2016-09-30 MED ORDER — LORAZEPAM 2 MG/ML IJ SOLN
1.0000 mg | Freq: Three times a day (TID) | INTRAMUSCULAR | Status: DC
  Administered 2016-09-30: 1 mg via INTRAVENOUS
  Filled 2016-09-30: qty 1

## 2016-09-30 MED ORDER — MORPHINE BOLUS VIA INFUSION
2.0000 mg | INTRAVENOUS | Status: DC | PRN
  Filled 2016-09-30: qty 2

## 2016-09-30 MED ORDER — LORAZEPAM 2 MG/ML IJ SOLN: 1.0000 mg | INTRAMUSCULAR | Status: DC | PRN

## 2016-09-30 MED ORDER — HALOPERIDOL LACTATE 5 MG/ML IJ SOLN: 1.0000 mg | Freq: Four times a day (QID) | INTRAMUSCULAR | Status: DC | PRN

## 2016-09-30 MED ORDER — BISACODYL 10 MG RE SUPP: 10.0000 mg | Freq: Every day | RECTAL | Status: DC | PRN

## 2016-09-30 DEATH — deceased

## 2016-10-01 ENCOUNTER — Ambulatory Visit: Payer: Medicaid Other

## 2016-10-01 ENCOUNTER — Other Ambulatory Visit: Payer: Medicaid Other

## 2016-10-01 ENCOUNTER — Encounter: Payer: Medicaid Other | Admitting: Nutrition

## 2016-10-01 ENCOUNTER — Ambulatory Visit: Payer: Medicaid Other | Admitting: Internal Medicine

## 2016-10-11 ENCOUNTER — Other Ambulatory Visit: Payer: Self-pay | Admitting: Nurse Practitioner

## 2016-10-22 ENCOUNTER — Ambulatory Visit: Payer: Medicaid Other

## 2016-10-22 ENCOUNTER — Other Ambulatory Visit: Payer: Medicaid Other

## 2016-10-22 ENCOUNTER — Ambulatory Visit: Payer: Medicaid Other | Admitting: Internal Medicine

## 2016-10-31 NOTE — Progress Notes (Signed)
OT Cancellation Note  Patient Details Name: Roger Barnes MRN: [id number] DOB: 1961-04-25   Cancelled Treatment:    Reason Eval/Treat Not Completed:  (Pt receiving comfort measures. OT signing off.)  [name] 15-Oct-2016, 1:27 PM

## 2016-10-31 NOTE — Progress Notes (Signed)
Palliative Medicine RN Note: AM symptom check. Pt is non responsive and more skeletal. Per RN no longer taking PO. I am concerned that he is progressing rapidly, as Dr Domingo Cocking discussed yesterday. I called and spoke to his dad; his parents will come today at 1400 to meet with PMT.  Please call us with changes in the interim.  Marjie Skiff Georgio Hattabaugh, RN, BSN, [facility] 10-07-2016 9:14 AM Cell [phone] 8:00-4:00 Monday-Friday Office [phone]

## 2016-10-31 NOTE — Progress Notes (Signed)
Pt expired at 2136 hrs pronounced by 2 RNs. Pt was comfort care only. Death certificate completed and given to RN.  KJKG, NP [facility]

## 2016-10-31 NOTE — Progress Notes (Signed)
200cc morphine gtt WIS with [employer] exhibitions officer.

## 2016-10-31 NOTE — Telephone Encounter (Signed)
""  My son is scheduled to come in Wednesday and Oct 22, 2016.  He is in [facility] on life support.  Please cancel these appointments. We do not expect him to live."   [facility] room [id number]-01. Will notify provider.  Expressed prayers for patient and family.  Operator also received call is cancelling tomorrow's appointment.

## 2016-10-31 NOTE — Discharge Summary (Signed)
Expiration Note  [name]  MR#: [id number]  DOB:1960/06/20  Date of Admission: 10/08/16 Date of Death: 10-13-2016  Attending [place]  Patient's PCP: Maren Reamer, MD  Consults: palliative medicine   Cause of Death: Healthcare-associated pneumonia  Secondary Diagnoses Present on Admission: . Healthcare-associated pneumonia . Acute respiratory failure with hypoxia ([place]) . Adenocarcinoma of right lung, stage 4 ([place]) . Antineoplastic chemotherapy induced anemia . Leukocytosis . Protein-calorie malnutrition, severe . Stable Hydropneumothorax    Hospital Course: HPI: [name] is a 55 y.o. 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 He had a previous pleurex placed by Dr. Nils Pyle. It was removed after completing his last cycle of chemo as it was not draining per patient. Patient follows with Dr. Julien Nordmann.  He has a history of previous recurrent right sided pleural effusions.  He was recently discharged 4/10 for an observation at [facility] for dyspnea.  CT surgery felt that his hydrothorax was stable and no intervention was needed.  He presents today with bilateral anterior chest wall pain and shortness of breath. He has an anxiety disorder and took xanax with no relief.  He reportedly was tachypneic and appeared markedly anxious but his oxygen saturation was good.  He was 98% on room air and 100% on oxygen mask.  He was evaluated in ED and noted to have a chest CT that was suspicious for pneumonia infiltrate acute on chronic.  No PE was noted.  He was started on empiric therapy for HCAP and admission was requested.   Pt expired at 25-Aug-2134 hrs pronounced by 2 RNs. Pt was comfort care only. Death certificate completed and given to RN.     1. Acute on chronic respiratory failure from stage 4 lung cancer with superimposed HCAP - likely contracted infection from recent hospitalization -  treated with cefepime but no significant improvement, he rapidly declined as his respiratory failure progressed.   He had initially been placed on bipap for respiratory support and now on HFNC with no desire to go back on bipap. He is on morphine infusion now.  He was transitioned to full comfort care with the assistance of the palliative care team.   2. Stage 4 lung cancer - Pt seems to have had a significant decline in past 2 weeks after speaking with mother by phone, I asked for palliative medicine team to see him and they have started him on oral morphine to help with the shortness of breath, pain and discomfort. He was transitioned to full comfort care.  3. Protein calorie malnutrition - severe - secondary to stage 4 lung cancer.  4. Leukocytosis - secondary to pneumonia.   5. Stable Hydrothorax - stable from 2 recent xrays 6. Hyponatremia - likely from mild dehydration, Improved with NS.  7. Chest pain - resolved likely secondary to pneumonia and lung cancer, CT chest negative for PE  and troponin 0.00. EKG no acute findings.   SignedIrwin Brakeman MD 10/01/2016, 7:56 AM

## 2016-10-31 NOTE — Progress Notes (Signed)
Pt passed away at 2136. This RN and [employer] T., RN listened for heart and breath sounds >2 minutes, none auscultated. NP made aware, CDS notified. Pts RN notified family.

## 2016-10-31 NOTE — Progress Notes (Signed)
PROGRESS NOTE   PIERSON VANTOL  PYK:[id number]  DOB: 1960/09/07  DOA: 09/12/2016 PCP: Maren Reamer, MD Outpatient Specialists:  Hospital course: 56 y.o. male  with past medical history of Stage IV lung cancer with recurrent hydrothorax, pleural effusions admitted on 09/18/2016 with shortness of breath and bilateral chest wall pain.   Assessment & Plan:   1. Acute on chronic respiratory failure from stage 4 lung cancer with superimposed HCAP - likely contracted infection from recent hospitalization - treating with cefepime.  He had initially been placed on bipap for respiratory support and now on HFNC with no desire to go back on bipap. He is on morphine infusion now.  He likely will transition to full comfort care today.    2. Stage 4 lung cancer - Pt seems to have had a significant decline in past 2 weeks after speaking with mother by phone, I asked for palliative medicine team to see him and they have started him on oral morphine to help with the shortness of breath, pain and discomfort. He likely will be transitioned to full comfort care today. Planning for residential hospice placement.  Appreciate assistance from palliative medicine team.  3. Protein calorie malnutrition - severe - secondary to stage 4 lung cancer.  4. Leukocytosis - secondary to pneumonia.   5. Stable Hydrothorax - stable from 2 recent xrays 6. Hyponatremia - likely from mild dehydration, Improved with NS.  7. Chest pain - resolved likely secondary to pneumonia and lung cancer, CT chest negative for PE  and troponin 0.00. EKG no acute findings.     DVT Prophylaxis: lovenox, TED hoses Code Status: DNR/DNI Family Communication: phone  Disposition Plan: Residential hospice   Subjective: Pt appears comfortable on IV morphine, no apparent distress.  Unresponsive this morning.   Objective: Vitals:   09/29/16 2003 09/29/16 2343 10-21-16 0137 Oct 21, 2016 0434  BP: (!) 131/92 134/88    Pulse: (!) 112 (!) 115  (!)  107  Resp: 19 (!) 21  (!) 7  Temp: 97.9 F (36.6 C) 98.7 F (37.1 C)    TempSrc: Axillary Axillary  Axillary  SpO2: 95% 94% 94% 98%  Weight:      Height:        Intake/Output Summary (Last 24 hours) at 10/21/16 0748 Last data filed at 10-21-16 0700  Gross per 24 hour  Intake           596.57 ml  Output              120 ml  Net           476.57 ml   Filed Weights   09/10/2016 1606 09/27/2016 2222  Weight: 61.2 kg (134 lb 14.7 oz) 60.3 kg (133 lb)    Exam:  General exam: cachectic male, appears chronically ill, unresponsive. Appears comfortable.   Respiratory system: tachypneic.  Cardiovascular system: S1 & S2 heard, tachycardic. No JVD, murmurs, gallops, clicks or pedal edema. Gastrointestinal system: Abdomen is nondistended, soft and nontender. Normal bowel sounds heard. Central nervous system: nonfocal.  Extremities: no cyanosis or edema.  Data Reviewed: Basic Metabolic Panel:  Recent Labs Lab 09/11/2016 1357 09/02/2016 2247 09/26/16 0413 09/27/16 0311  NA 132*  --  134* 134*  K 4.6  --  4.7 4.6  CL 90*  --  93* 90*  CO2  --   --  33* 36*  GLUCOSE 104*  --  101* 106*  BUN 19  --  14 13  CREATININE 0.70 0.79  0.76 0.95  CALCIUM  --   --  9.0 8.9   Liver Function Tests:  Recent Labs Lab 09/26/16 0413  AST 29  ALT 20  ALKPHOS 85  BILITOT 0.4  PROT 6.1*  ALBUMIN 2.4*   No results for input(s): LIPASE, AMYLASE in the last 168 hours. No results for input(s): AMMONIA in the last 168 hours. CBC:  Recent Labs Lab 09/17/2016 1335 09/07/2016 1357 09/26/16 0413 09/27/16 0311  WBC 14.3*  --  14.3* 11.1*  NEUTROABS 11.9*  --   --   --   HGB 14.1 16.0 11.7* 11.7*  HCT 43.2 47.0 36.1* 37.0*  MCV 93.7  --  93.5 95.6  PLT 342  --  340 303   Cardiac Enzymes:  Recent Labs Lab 09/03/2016 2247 09/26/16 0413 09/26/16 1125  TROPONINI 0.03* 0.03* <0.03   CBG (last 3)  No results for input(s): GLUCAP in the last 72 hours. Recent Results (from the past 240 hour(s))    Culture, blood (Routine X 2) w Reflex to ID Panel     Status: None (Preliminary result)   Collection Time: 09/16/2016  4:50 PM  Result Value Ref Range Status   Specimen Description BLOOD SOURCE UNKNOWN  Final   Special Requests   Final    BOTTLES DRAWN AEROBIC AND ANAEROBIC Blood Culture adequate volume   Culture NO GROWTH 4 DAYS  Final   Report Status PENDING  Incomplete  Culture, blood (Routine X 2) w Reflex to ID Panel     Status: None (Preliminary result)   Collection Time: 09/02/2016  5:04 PM  Result Value Ref Range Status   Specimen Description BLOOD RIGHT ANTECUBITAL  Final   Special Requests   Final    BOTTLES DRAWN AEROBIC AND ANAEROBIC Blood Culture adequate volume   Culture NO GROWTH 4 DAYS  Final   Report Status PENDING  Incomplete  MRSA PCR Screening     Status: None   Collection Time: 09/21/2016 10:30 PM  Result Value Ref Range Status   MRSA by PCR NEGATIVE NEGATIVE Final    Comment:        The GeneXpert MRSA Assay (FDA approved for NASAL specimens only), is one component of a comprehensive MRSA colonization surveillance program. It is not intended to diagnose MRSA infection nor to guide or monitor treatment for MRSA infections.     Studies: No results found. Scheduled Meds: . ALPRAZolam  0.25 mg Oral TID  . bisacodyl  5 mg Oral Daily  . enoxaparin (LOVENOX) injection  40 mg Subcutaneous Q24H  . feeding supplement (ENSURE ENLIVE)  237 mL Oral TID BM  . ipratropium-albuterol  3 mL Nebulization Q6H  . mouth rinse  15 mL Mouth Rinse BID  . metoprolol tartrate  25 mg Oral BID  . sodium chloride flush  3 mL Intravenous Q12H   Continuous Infusions: . sodium chloride    . ceFEPime (MAXIPIME) IV Stopped (10/13/2016 0542)  . morphine 1 mg/hr (09/29/16 1900)    Principal Problem:   Healthcare-associated pneumonia Active Problems:   Stable Hydropneumothorax   Acute respiratory failure with hypoxia (HCC)   Adenocarcinoma of right lung, stage 4 (HCC)    Antineoplastic chemotherapy induced anemia   Protein-calorie malnutrition, severe   Leukocytosis   Palliative care by specialist  Time spent:   Irwin Brakeman, MD, FAAFP [facility] Pager [phone]  If 7PM-7AM, please contact night-coverage [email or web address] Password [email or web address] 2016-10-13, 7:48 AM    LOS: 5 days

## 2016-10-31 NOTE — Progress Notes (Signed)
Daily Progress Note   Patient Name: Roger Barnes       Date: 10/01/16 DOB: 11-18-1960  Age: 56 y.o. MRN#: [id number] Attending Physician: Murlean Iba, MD Primary Care Physician: Maren Reamer, MD Admit Date: 09/17/2016  Reason for Consultation/Follow-up: Establishing goals of care, Non pain symptom management, Pain control and Psychosocial/spiritual support  Subjective: Roger Barnes is alert and will occasionally answer or say something to [place] but very difficult to understand. Also appears delirious today.   Length of Stay: 5  Current Medications: Scheduled Meds:  . ALPRAZolam  0.25 mg Oral TID  . bisacodyl  5 mg Oral Daily  . enoxaparin (LOVENOX) injection  40 mg Subcutaneous Q24H  . feeding supplement (ENSURE ENLIVE)  237 mL Oral TID BM  . ipratropium-albuterol  3 mL Nebulization Q6H  . mouth rinse  15 mL Mouth Rinse BID  . metoprolol tartrate  25 mg Oral BID  . sodium chloride flush  3 mL Intravenous Q12H    Continuous Infusions: . sodium chloride 250 mL (Oct 01, 2016 0700)  . ceFEPime (MAXIPIME) IV Stopped (10-01-2016 0542)  . morphine 1 mg/hr (09/29/16 1900)    PRN Meds: sodium chloride, acetaminophen **OR** acetaminophen, ALPRAZolam, LORazepam, morphine, ondansetron **OR** ondansetron (ZOFRAN) IV, polyethylene glycol, prochlorperazine, sodium chloride flush, zolpidem  Physical Exam  Constitutional: He appears cachectic. He appears ill.  HENT:  Temporal wasting  Cardiovascular: Tachycardia present.   Pulmonary/Chest: Accessory muscle usage present. Tachypnea noted. He is in respiratory distress. He has decreased breath sounds.  Severely increased WOB  Abdominal: Normal appearance.  Neurological: He is alert. He is disoriented.  Skin: Skin is warm and dry.    Psychiatric:  Unable to assess  Nursing note and vitals reviewed.           Vital Signs: BP 124/79 (BP Location: Right Arm)   Pulse (!) 110   Temp 97.5 F (36.4 C) (Oral)   Resp 19   Ht 5' 10"  (1.778 m)   Wt 60.3 kg (133 lb)   SpO2 98%   BMI 19.08 kg/m  SpO2: SpO2: 98 % O2 Device: O2 Device: High Flow Nasal Cannula O2 Flow Rate: O2 Flow Rate (L/min): 5 L/min  Intake/output summary:   Intake/Output Summary (Last 24 hours) at 2016-10-01 1349 Last data filed at 10/01/2016 1100  Gross per 24  hour  Intake           220.57 ml  Output                0 ml  Net           220.57 ml   LBM: Last BM Date: 09/27/16 Baseline Weight: Weight: 61.2 kg (134 lb 14.7 oz) Most recent weight: Weight: 60.3 kg (133 lb)       Palliative Assessment/Data:    Flowsheet Rows     Most Recent Value  Intake Tab  Referral Department  Hospitalist  Unit at Time of Referral  Intermediate Care Unit  Palliative Care Primary Diagnosis  Cancer  Date Notified  09/26/16  Palliative Care Type  New Palliative care  Reason for referral  Clarify Goals of Care, Non-pain Symptom, Psychosocial or Spiritual support  Date of Admission  09/07/2016  Date first seen by Palliative Care  09/26/16  # of days Palliative referral response time  0 Day(s)  # of days IP prior to Palliative referral  1  Clinical Assessment  Palliative Performance Scale Score  30%  Pain Max last 24 hours  Not able to report  Pain Min Last 24 hours  Not able to report  Dyspnea Max Last 24 Hours  Not able to report  Dyspnea Min Last 24 hours  Not able to report  Nausea Max Last 24 Hours  Not able to report  Nausea Min Last 24 Hours  Not able to report  Anxiety Max Last 24 Hours  Not able to report  Anxiety Min Last 24 Hours  Not able to report  Other Max Last 24 Hours  Not able to report  Psychosocial & Spiritual Assessment  Palliative Care Outcomes  Patient/Family wishes: Interventions discontinued/not started   Mechanical Ventilation   Palliative Care follow-up planned  No      Patient Active Problem List   Diagnosis Date Noted  . Palliative care by specialist   . Healthcare-associated pneumonia 08/31/2016  . Leukocytosis 09/26/2016  . Pressure injury of skin 09/09/2016  . Protein-calorie malnutrition, severe 09/09/2016  . Tachycardia 09/08/2016  . Goals of care, counseling/discussion 07/23/2016  . Antineoplastic chemotherapy induced anemia 05/07/2016  . Stuffy and runny nose 05/07/2016  . Encounter for antineoplastic chemotherapy 02/26/2016  . Adenocarcinoma of right lung, stage 4 ([place])   . Acute respiratory failure with hypoxia ([employer])   . Atelectasis   . Stable Hydropneumothorax 01/25/2016  . Elevated d-dimer 01/25/2016  . Abnormal EKG 01/25/2016  . Pleural effusion, right   . 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 He was recently discharged from [facility] on 4/10 for shortness of breath. Patient is followed by oncologist, Dr. Earlie Server,. In the past he has had a large right pleural effusion that was required thoracentesis. He also has a history of a Pleurx catheter being placed which was removed because it was not draining. CT surgery felt that his hydrothorax was stable and no intervention was needed at this time; repeat CT scan was suspicious for bilateral pneumonia.   Assessment: I met with [name]'s parents at bedside. Long conversation (his father cannot speak but able to communicate by writing). They understand that he is at EOL and worsening. It is hard for them to see him in this condition.  I described to them my assessment of his discomfort with fast, shallow breathing with little to no air movement. Recommended to be more aggressive with comfort medications recognizing this may  make him more sleepy but with the goal to be comfortable and restful. They agree with making sure that he is comfortable. Explained that it is normal as people progress at the end of their lives to need more morphine to keep them comfortable and why this is helpful. Answered all questions/concerns and provided emotional support.   Recommendations/Plan:  Increase morphine continuous infusion to 44m IV and 2 mg every 30 min prn as needed for pain or shortness of breath  Stopped Xanax as unable to take po. Scheduled Ativan 1 mg TID and also available 1 mg every 2 hours prn.   Will stop antibiotics with no escalation of care (no further bipap) and priority on his comfort through aggressive symptom management.  Medications for comfort should not be held even if this causes undesired side effects such as increased somnolence or respiratory depression.   Palliative medicine team to stay involved for symptom management as well as subsequent discharge planning (Likely residential hospice if he declines further or once he has completed antibiotic course).  I feel with [name]'s rapid declining status that he is likely to have a hospital death and this was discussed with his parents. He in no way would be able to tolerate an ambulance ride in this condition.   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 His symptoms seem to be worsening and I think that he is beginning to have acute worsening of his respiratory failure.  Discharge Planning:  Anticipate hospital death   Thank you for allowing the Palliative Medicine Team to assist in the care of this patient.   Total Time 663m  Prolonged Time Billed  no       Greater than 50%  of this time was spent counseling and coordinating care related to the above assessment and plan.  PaPershing ProudNP  Please contact Palliative Medicine Team phone at [phone] questions and concerns.

## 2016-10-31 NOTE — Progress Notes (Signed)
Nurse notified the medical examiner who clarified this was not a ME case.

## 2016-10-31 DEATH — deceased

## 2016-12-17 DIAGNOSIS — C3491 Malignant neoplasm of unspecified part of right bronchus or lung: Secondary | ICD-10-CM

## 2018-01-31 IMAGING — CT NM PET TUM IMG INITIAL (PI) SKULL BASE T - THIGH
7 series · 25 of 25 positions shown · non-contrast
Comparison: None

CLINICAL DATA: Initial treatment strategy for RIGHT lung
adenocarcinoma..

EXAM:
NUCLEAR MEDICINE PET SKULL BASE TO THIGH
TECHNIQUE: 8.2 mCi F-18 FDG was injected intravenously. Full-ring PET imaging
was performed from the skull base to thigh after the radiotracer. CT
data was obtained and used for attenuation correction and anatomic
localization.
FASTING BLOOD GLUCOSE:  Value: 122 mg/dl

[Series 3: pet sk_thigh ac · axial · 5.0mm · 4.07mm/px · z∈[-1141,-189]mm · 6 of 239 slices shown]
[im 1/239]
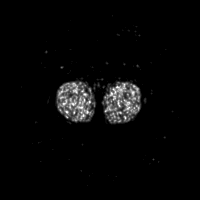
[im 48/239]
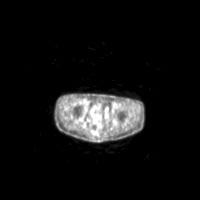
[im 96/239]
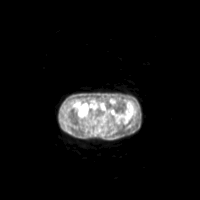
[im 143/239]
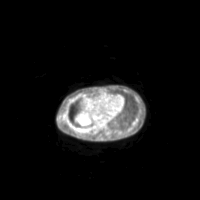
[im 191/239]
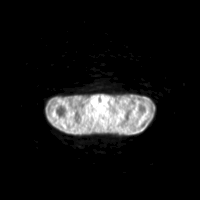
[im 239/239]
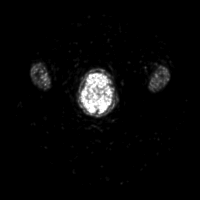

[Series 4: ct sk_thigh 5.0 b31f · axial · 5.0mm · 0.98mm/px · z∈[-1141,-189]mm · 5 of 239 slices shown]
[im 1/239]
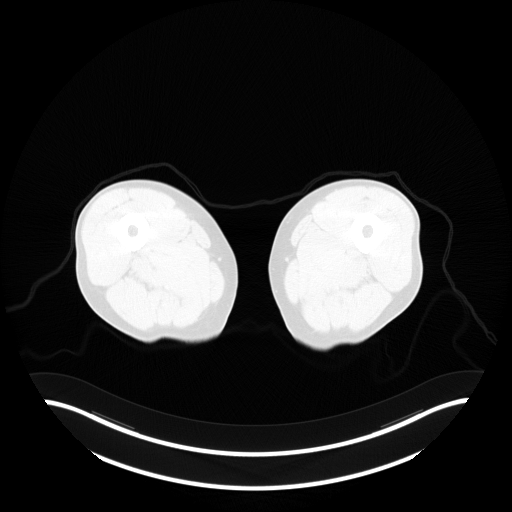
[im 60/239]
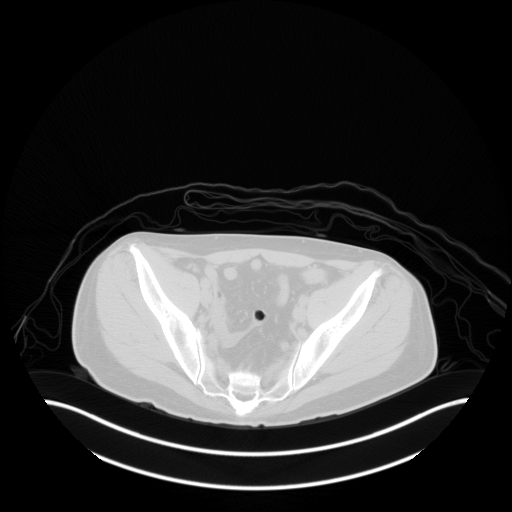
[im 120/239]
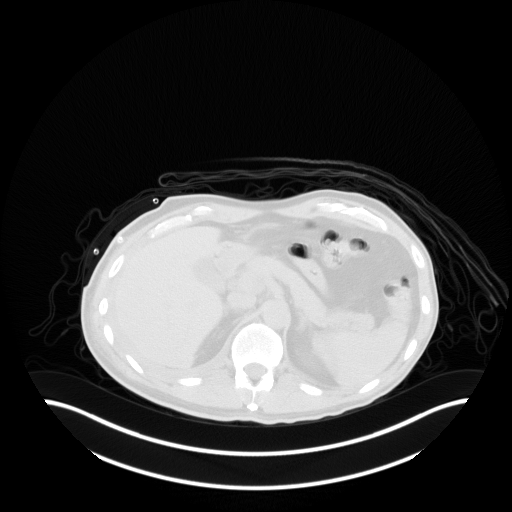
[im 179/239]
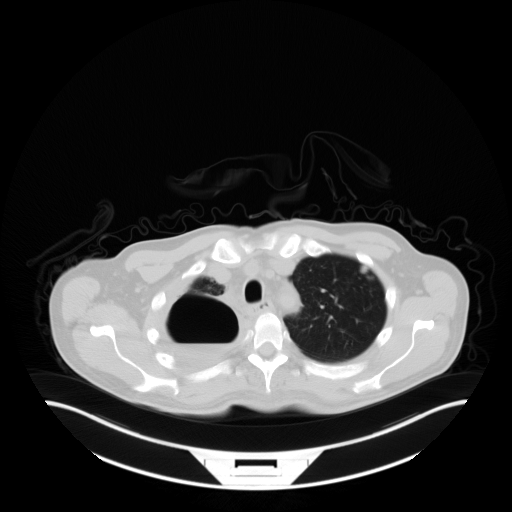
[im 239/239  brain]
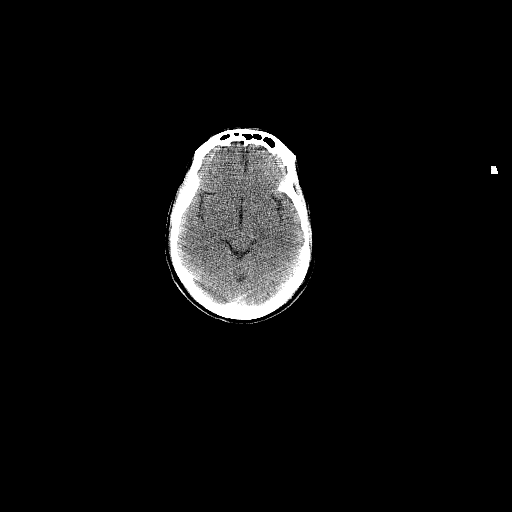

[Series 7: pet sk_thigh nac · axial · 5.0mm · 4.07mm/px · z∈[-1141,-189]mm · 5 of 239 slices shown]
[im 1/239]
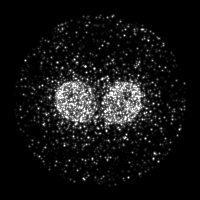
[im 60/239]
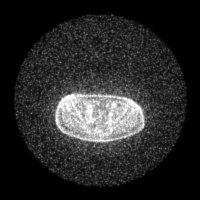
[im 120/239]
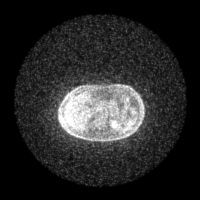
[im 179/239]
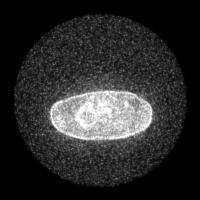
[im 239/239]
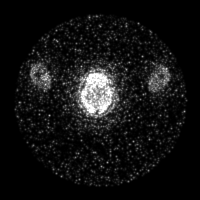

[Series 8: ct sk_thigh 5.0 b70f lung_bone · axial · 5.0mm · 0.73mm/px · z∈[-695,-359]mm · 2 of 85 slices shown]
[im 1/85  bone]
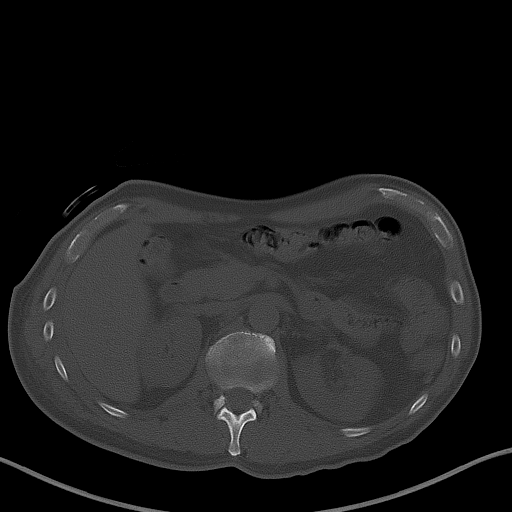
[im 85/85  bone]
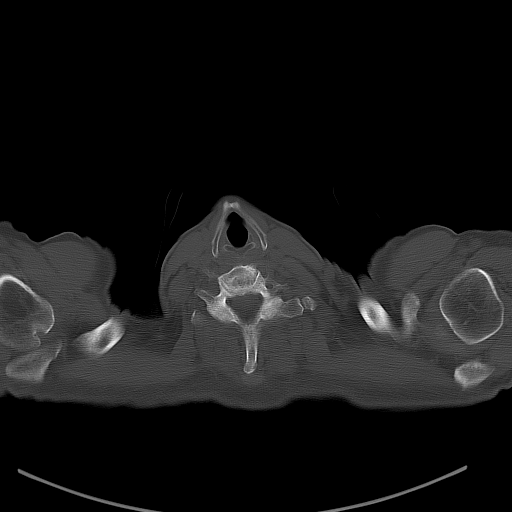

[Series 603: range-ct sk_thigh 5.0 (id)<alpha range> · 1 of 63 slices shown (1 of 2)]
[im 1/63]
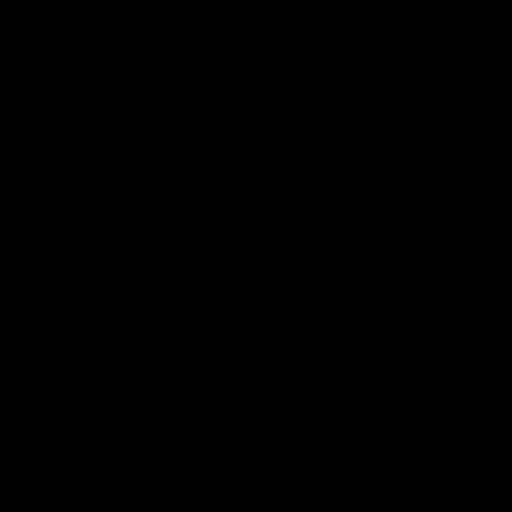

[Series 604: mip collection · coronal · 1.98mm/px · 1 of 32 slices shown]
[im 1/32]
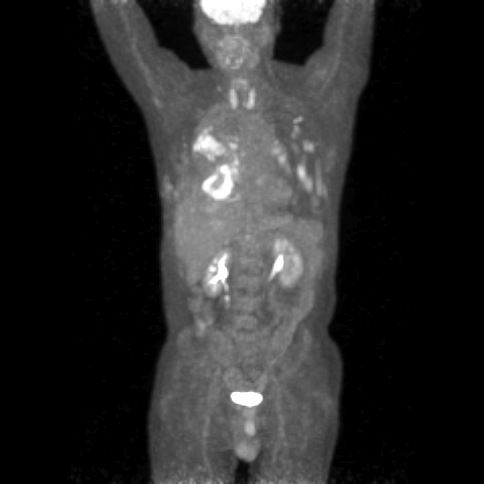

[Series 605: range-ct sk_thigh 5.0 (id)<alpha range> · 5 of 224 slices shown (2 of 2)]
[im 1/224]
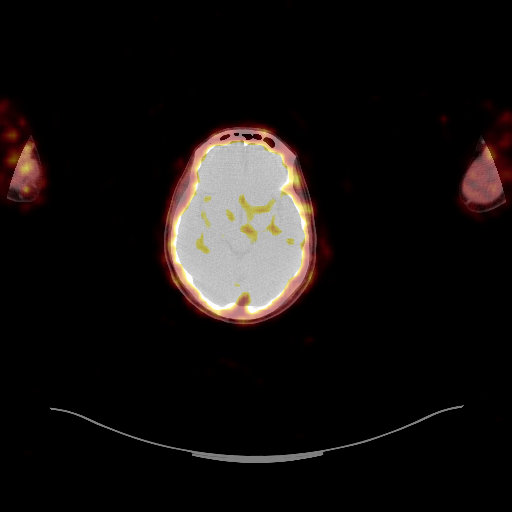
[im 56/224]
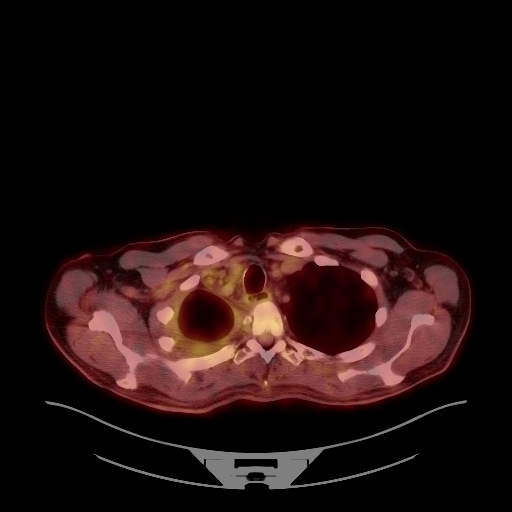
[im 112/224]
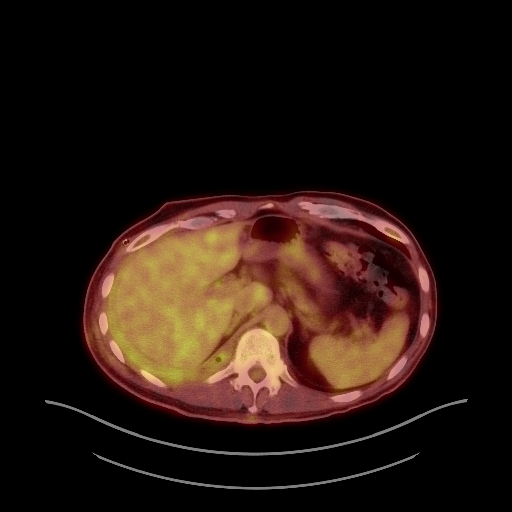
[im 168/224]
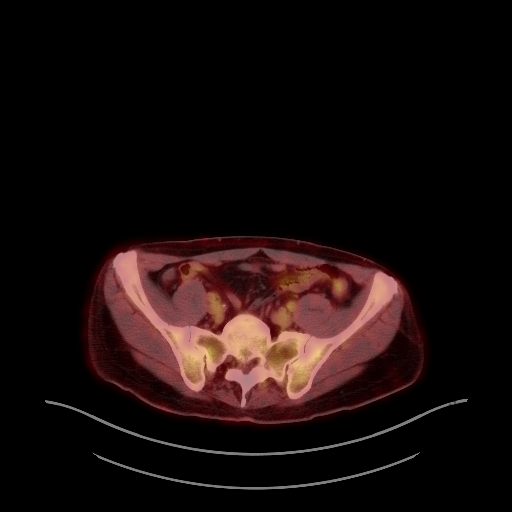
[im 224/224]
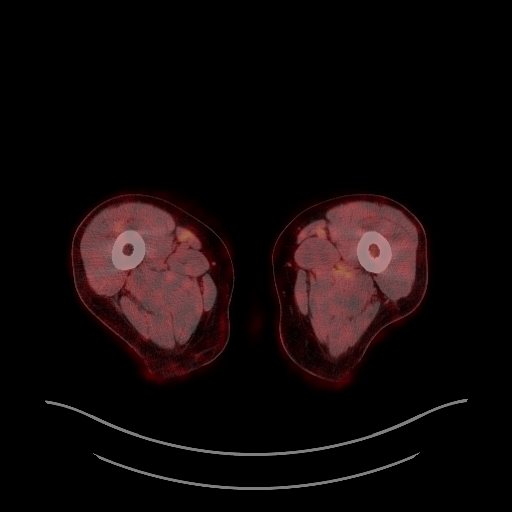

[25 of 25 positions shown; findings below may reference images not displayed]

FINDINGS: NECK

No hypermetabolic lymph nodes in the neck.

CHEST

Rounded hypermetabolic mass in the RIGHT lower lobe measures 7.4 x
3.9 cm with intense metabolic activity (SUV max equal 19).

There is a moderate to large RIGHT hydro pneumothorax with chest
tube in place.

Mild hypermetabolic activity within the mildly thickened RIGHT
pleural surface.

Within the RIGHT upper lobe there is hypermetabolic thickening
anterior to the fissure with intense metabolic activity (SUV max
equal 14.3 closed.

Within the LEFT lung there are discrete round pulmonary nodules.
Example nodule measuring 16 mm (image 49, series 8) with associated
metabolic activity with SUV max equal 9.5. Additional smaller more
superior rounded peripheral nodules (image 35, series 8) also
associated metabolic activity. Cluster of hypermetabolic nodules in
the LEFT upper lobe along the anterior pleural space are
hypermetabolic with SUV max equal 5.1.

ABDOMEN/PELVIS

Adrenal glands are normal. No abnormal metabolic activity liver. No
hypermetabolic abdominal pelvic adenopathy.

SKELETON

No focal hypermetabolic activity to suggest skeletal metastasis.
IMPRESSION: 1. Hypermetabolic RIGHT lower lobe mass with intense metabolic
activity.
2. Multiple round peripheral hypermetabolic nodules within the LEFT
upper lobe and LEFT lower lobe consistent with pulmonary metastasis.
3. Hypermetabolic pleural thickening within the RIGHT lung is either
reactive or neoplastic.
4. Hypermetabolic thickening along the fissure of the RIGHT upper
lobe is concerning for carcinoma.
5. Large hydro pneumothorax on the RIGHT with chest tube in place.
